# Patient Record
Sex: Female | Born: 1945 | Race: Black or African American | Hispanic: No | Smoking: Former smoker
Health system: Southern US, Community
[De-identification: ages and names within clinical notes are randomized; demographics above are authoritative.]

## PROBLEM LIST (undated history)

## (undated) DIAGNOSIS — I639 Cerebral infarction, unspecified: Secondary | ICD-10-CM

## (undated) DIAGNOSIS — C801 Malignant (primary) neoplasm, unspecified: Secondary | ICD-10-CM

## (undated) DIAGNOSIS — I10 Essential (primary) hypertension: Secondary | ICD-10-CM

## (undated) DIAGNOSIS — C50919 Malignant neoplasm of unspecified site of unspecified female breast: Secondary | ICD-10-CM

## (undated) DIAGNOSIS — F0390 Unspecified dementia without behavioral disturbance: Secondary | ICD-10-CM

---

## 2018-02-17 ENCOUNTER — Other Ambulatory Visit: Payer: Self-pay | Admitting: Nurse Practitioner

## 2018-02-17 DIAGNOSIS — Z1231 Encounter for screening mammogram for malignant neoplasm of breast: Secondary | ICD-10-CM

## 2019-04-14 ENCOUNTER — Ambulatory Visit: Payer: Medicare PPO | Admitting: Physical Therapy

## 2019-05-14 ENCOUNTER — Other Ambulatory Visit: Payer: Self-pay

## 2019-05-14 ENCOUNTER — Ambulatory Visit: Payer: Medicare PPO | Attending: Family Medicine | Admitting: Physical Therapy

## 2019-05-14 ENCOUNTER — Encounter: Payer: Self-pay | Admitting: Physical Therapy

## 2019-05-14 DIAGNOSIS — R296 Repeated falls: Secondary | ICD-10-CM | POA: Diagnosis present

## 2019-05-14 DIAGNOSIS — R262 Difficulty in walking, not elsewhere classified: Secondary | ICD-10-CM | POA: Diagnosis present

## 2019-05-14 DIAGNOSIS — R29810 Facial weakness: Secondary | ICD-10-CM

## 2019-05-14 NOTE — Therapy (Signed)
[place], [place], [address] Phone: [phone]   Fax:  [phone]  Physical Therapy Evaluation  Patient Details  Name: Olivia Ross MRN: [id number] Date of Birth: [birth year] Referring Provider (PT): Manfredl   Encounter Date: 05/14/2019  PT End of Session - 05/14/19 1144    Visit Number  1    Date for PT Re-Evaluation  07/12/19    PT Start Time  [id number]    PT Stop Time  1105    PT Time Calculation (min)  30 min    Activity Tolerance  Patient tolerated treatment well    Behavior During Therapy  [facility] for tasks assessed/performed       History reviewed. No pertinent past medical history.  History reviewed. No pertinent surgical history.  There were no vitals filed for this visit.   Subjective Assessment - 05/14/19 1038    Subjective  Patient arrives 20 minutes late, but our evals are out aobut 2 weeks so I agreed to see her.  She reports that she had a hospitalization back in November, she reports that she was weak and had diffiuclty walking.  She denies any falls, lives with daughter.  She does describe that she was having home health a few years ago due to weakness., patients order is from early December but she is just now getting here.  Patient is a poor historian, when the daughter came in she helped fill in some of the history, has had home health a few years ago for weakness, had a fall in the summer with a broken jaw.    Patient Stated Goals  be stronger, walk better    Currently in Pain?  No/denies         [facility] PT Assessment - 05/14/19 0001      Assessment   Medical Diagnosis  weakness, difficulty walking    Referring Provider (PT)  Manfredl    Onset Date/Surgical Date  02/11/19      Precautions   Precautions  None      Balance Screen   Has the patient fallen in the past 6 months  No    Has the patient had a decrease in activity level because of a fear of falling?   Yes     Is the patient reluctant to leave their home because of a fear of falling?   Yes      Home Environment   Additional Comments  lives with daughter in an apartment,       Prior Function   Level of Independence  Independent with household mobility with device;Needs assistance with homemaking    Vocation  Retired    Leisure  no      Mining engineer Comments  fwd head, rounded shoulders      ROM / Strength   AROM / PROM / Strength  AROM;Strength      AROM   Overall AROM Comments  WFL's      Strength   Overall Strength Comments  Shoulders 3+/5, hips 4-/5, knee extension 4/5, knee flexion 3+/5, ankdl 4-/5      Ambulation/Gait   Gait Comments  uses a walker, at times is distracted, shuffles feet      Standardized Balance Assessment   Standardized Balance Assessment  Berg Balance Test;Timed Up and Go Test      Berg Balance Test   Sit to Stand  Able to stand using hands after several  tries    Standing Unsupported  Needs several tries to stand 30 seconds unsupported    Sitting with Back Unsupported but Feet Supported on Floor or Stool  Able to sit safely and securely 2 minutes    Stand to Sit  Controls descent by using hands    Transfers  Able to transfer safely, definite need of hands    Standing Unsupported with Eyes Closed  Able to stand 10 seconds with supervision    Standing Unsupported with Feet Together  Needs help to attain position but able to stand for 30 seconds with feet together    From Standing, Reach Forward with Outstretched Arm  Can reach forward >5 cm safely (2")    From Standing Position, Pick up Object from Floor  Unable to try/needs assist to keep balance    From Standing Position, Turn to Look Behind Over each Shoulder  Needs supervision when turning    Turn 360 Degrees  Needs assistance while turning    Standing Unsupported, Alternately Place Feet on Step/Stool  Needs assistance to keep from falling or unable to try    Standing Unsupported, One  Foot in [employer] balance while stepping or standing    Standing on One Leg  Unable to try or needs assist to prevent fall    Total Score  20      Timed Up and Go Test   Normal TUG (seconds)  90    TUG Comments  with FWW, very slow, step to gait                Objective measurements completed on examination: See above findings.              PT Education - 05/14/19 1143    Education Details  gave HEP for her holding walker, doing kicks and marches with the daughter    Person(s) Educated  Patient;Child(ren)    Methods  Explanation;Demonstration;Handout    Comprehension  Verbalized understanding       PT Short Term Goals - 05/14/19 1153      PT SHORT TERM GOAL #1   Title  independent with initial HEP    Time  2    Period  Weeks    Status  New        PT Long Term Goals - 05/14/19 1153      PT LONG TERM GOAL #1   Title  decrease TUG time to 45 seconds    Time  8    Period  Weeks    Status  New      PT LONG TERM GOAL #2   Title  increase BERG balance score to 40/56    Time  8    Period  Weeks    Status  New      PT LONG TERM GOAL #3   Title  stand up from sitting without hands    Time  8    Period  Weeks    Status  New      PT LONG TERM GOAL #4   Title  be independent with an advanced HEP for balance and strength    Time  8    Period  Weeks    Status  New             Plan - 05/14/19 1145    Clinical Impression Statement  Patient is a poor historian, she seems distracted and times and had to process at times.  The daughter helped fill in some but due to them being late it was a rushed eval.  Her TUG was 90 seconds and her BERG was 20/56 putting her at a high risk for falls.  She was very slow with shuffling gait and was unable to let go and attempt much balance wihtout holding onto something    Stability/Clinical Decision Making  Evolving/Moderate complexity    Clinical Decision Making  Moderate    Rehab Potential  Good    PT  Frequency  2x / week    PT Duration  8 weeks    PT Treatment/Interventions  ADLs/Self Care Home Management;Functional mobility training;Stair training;Gait training;Therapeutic activities;Therapeutic exercise;Balance training;Neuromuscular re-education;Patient/family education    PT Next Visit Plan  start gym activities    Consulted and Agree with Plan of Care  Patient       Patient will benefit from skilled therapeutic intervention in order to improve the following deficits and impairments:  Improper body mechanics, Postural dysfunction, Decreased strength, Decreased range of motion, Decreased activity tolerance, Decreased endurance, Decreased balance, Decreased coordination, Cardiopulmonary status limiting activity, Difficulty walking  Visit Diagnosis: Facial weakness - Plan: PT plan of care cert/re-cert  Difficulty in walking, not elsewhere classified - Plan: PT plan of care cert/re-cert  Repeated falls - Plan: PT plan of care cert/re-cert     Problem List There are no problems to display for this patient.   Sumner Boast., PT 05/14/2019, 11:59 AM  [address] [place], [place], [address] Phone: [phone]   Fax:  [phone]  Name: Olivia Ross MRN: [id number] Date of Birth: 1945-09-20

## 2019-05-21 ENCOUNTER — Ambulatory Visit: Payer: Medicare PPO | Admitting: Physical Therapy

## 2019-05-25 ENCOUNTER — Other Ambulatory Visit: Payer: Self-pay

## 2019-05-25 ENCOUNTER — Encounter: Payer: Self-pay | Admitting: Physical Therapy

## 2019-05-25 ENCOUNTER — Ambulatory Visit: Payer: Medicare PPO | Admitting: Physical Therapy

## 2019-05-25 DIAGNOSIS — R29810 Facial weakness: Secondary | ICD-10-CM | POA: Diagnosis not present

## 2019-05-25 DIAGNOSIS — R262 Difficulty in walking, not elsewhere classified: Secondary | ICD-10-CM

## 2019-05-25 DIAGNOSIS — R296 Repeated falls: Secondary | ICD-10-CM

## 2019-05-25 NOTE — Therapy (Addendum)
[place] [address] [place], [place], [address] Phone: [phone]   Fax:  [phone]  Physical Therapy Treatment  Patient Details  Name: Olivia Ross MRN: [id number] Date of Birth: 1945-08-27 Referring Provider (PT): Manfredl   Encounter Date: 05/25/2019  PT End of Session - 05/25/19 1340    Visit Number  2    Date for PT Re-Evaluation  07/12/19    PT Start Time  1324    PT Stop Time  1400    PT Time Calculation (min)  36 min       No past medical history on file.  No past surgical history on file.  There were no vitals filed for this visit.  Subjective Assessment - 05/25/19 1331    Subjective  pt did not make last session, daughter called and stated forgot appt. pt 9 min late today. c/o RT knee buckling and trembling    Currently in Pain?  No/denies                       [facility] Adult PT Treatment/Exercise - 05/25/19 0001      Exercises   Exercises  Knee/Hip      Knee/Hip Exercises: Aerobic   Nustep  L 3 6 min      Knee/Hip Exercises: Standing   Other Standing Knee Exercises  HHA marching fwd and backward 10 feet 2 times each   min A with cuing   Other Standing Knee Exercises  HHA side stepping 10 feet 2 times each   min A with cuing     Knee/Hip Exercises: Seated   Long Arc Quad  Both;2 sets;10 reps   3#   Long Arc Quad Weight  3 lbs.    Ball Squeeze  15x    Marching  Strengthening;Both;2 sets;10 reps;Weights    Marching Weights  3 lbs.    Hamstring Curl  Strengthening;Both;2 sets;10 reps   red tband   Abduction/Adduction   Both;2 sets;10 reps;Weights    Abd/Adduction Weights  3 lbs.    Sit to Sand  2 sets;5 reps;with UE support   from mat with cuing               PT Short Term Goals - 05/14/19 1153      PT SHORT TERM GOAL #1   Title  independent with initial HEP    Time  2    Period  Weeks    Status  New        PT Long Term Goals - 05/14/19 1153      PT LONG TERM GOAL #1   Title  decrease TUG time to 45 seconds    Time  8    Period  Weeks    Status  New      PT LONG TERM GOAL #2   Title  increase BERG balance score to 40/56    Time  8    Period  Weeks    Status  New      PT LONG TERM GOAL #3   Title  stand up from sitting without hands    Time  8    Period  Weeks    Status  New      PT LONG TERM GOAL #4   Title  be independent with an advanced HEP for balance and strength    Time  8    Period  Weeks  Status  New            Plan - 05/25/19 1342    Clinical Impression Statement  pt 9 minlate. pt and daughter verb RT knee buckled but pt denied pain throughout session, daughter did not stay for session. pt tolerated initial progression of ther ex fair - she needed constant cueing to continue with ex an dfor correct speed and ROM , pt was easily distratced and had a hard time staying on task.    PT Treatment/Interventions  ADLs/Self Care Home Management;Functional mobility training;Stair training;Gait training;Therapeutic activities;Therapeutic exercise;Balance training;Neuromuscular re-education;Patient/family education    PT Next Visit Plan  assess response to ex this session and progress       Patient will benefit from skilled therapeutic intervention in order to improve the following deficits and impairments:  Improper body mechanics, Postural dysfunction, Decreased strength, Decreased range of motion, Decreased activity tolerance, Decreased endurance, Decreased balance, Decreased coordination, Cardiopulmonary status limiting activity, Difficulty walking  Visit Diagnosis: Difficulty in walking, not elsewhere classified  Repeated falls     Problem List There are no problems to display for this patient.   [name],ANGIE PTA 05/25/2019, 1:49 PM  [place], [place], [address] Phone: [phone]   Fax:  [phone]  Name:  Olivia Ross MRN: [id number] Date of Birth: February 26, 1946  Patient's daughter Colletta [place] called and left a message for me to call her back, I returned her call at [facility].  She told me tha tshe felt belittled for the therapist to tell her that she was late and to try to get here on time.  The PTA explained that she just wanted the patient to get the amount of therapy needed.  I spoke to [place] and told her that I was sorry that she took the information that way but that we adhere to a schedule and try to stay on time to allow the amount of therapy that each patient is needing.  She told me that she was all alone and she is caring for her mom and doing this on her lunch hour and it is difficult, I tried to start to explain to her that we just try to let patients know so they can be on time, she got very angry and cussed numerous times at me that I did not understand what she is going through, ans she is doing the best that she can, I talked a little about that we changed appointments for her to not have to try to do it all at lunch, she started cussing again and I told her that I understand her frustration, she cussed and said that I did not and hung up Lum Babe, PT

## 2019-06-01 ENCOUNTER — Ambulatory Visit: Payer: Medicare PPO | Attending: Family Medicine

## 2019-06-01 ENCOUNTER — Other Ambulatory Visit: Payer: Self-pay

## 2019-06-01 DIAGNOSIS — R29810 Facial weakness: Secondary | ICD-10-CM | POA: Diagnosis present

## 2019-06-01 DIAGNOSIS — R262 Difficulty in walking, not elsewhere classified: Secondary | ICD-10-CM

## 2019-06-01 DIAGNOSIS — R296 Repeated falls: Secondary | ICD-10-CM | POA: Diagnosis present

## 2019-06-01 NOTE — Therapy (Signed)
[place] [place], [place], [address] Phone: [phone]   Fax:  [phone]  Physical Therapy Treatment  Patient Details  Name: Olivia Ross MRN: [id number] Date of Birth: [birth year] Referring Provider (PT): Manfredl   Encounter Date: 06/01/2019  PT End of Session - 06/01/19 1707    Visit Number  3    Date for PT Re-Evaluation  07/12/19    PT Start Time  1701    PT Stop Time  1741    PT Time Calculation (min)  40 min       No past medical history on file.  No past surgical history on file.  There were no vitals filed for this visit.  Subjective Assessment - 06/01/19 1705    Subjective  Pt. doing well. Pt. seen with daughter in waiting room.    Patient Stated Goals  be stronger, walk better    Currently in Pain?  No/denies    Multiple Pain Sites  No                       [facility] Adult PT Treatment/Exercise - 06/01/19 0001      Neuro Re-ed    Neuro Re-ed Details   with HH assist from therapist;  side stepping at mat table 8 x 8 ft       Lumbar Exercises: Standing   Heel Raises  10 reps;3 seconds      Knee/Hip Exercises: Aerobic   Nustep  L 3,  6 min      Knee/Hip Exercises: Standing   Heel Raises  Both;10 reps;2 sets    Heel Raises Limitations  heel and toe raise in RW    Knee Flexion  Right;Left;10 reps    Knee Flexion Limitations  in RW    Hip Flexion  Right;Left;15 reps;Knee bent;Stengthening    Hip Flexion Limitations  HH assist from therapist     Hip Abduction  Right;Left;Knee straight;Stengthening;5 reps    Abduction Limitations  HH assist       Knee/Hip Exercises: Seated   Long Arc Quad  Right;Left;15 reps    Long Arc Quad Weight  3 lbs.               PT Short Term Goals - 06/01/19 1708      PT SHORT TERM GOAL #1   Title  independent with initial HEP    Time  2    Period  Weeks    Status  On-going        PT Long Term Goals - 06/01/19 1708       PT LONG TERM GOAL #1   Title  decrease TUG time to 45 seconds    Time  8    Period  Weeks    Status  On-going      PT LONG TERM GOAL #2   Title  increase BERG balance score to 40/56    Time  8    Period  Weeks    Status  On-going      PT LONG TERM GOAL #3   Title  stand up from sitting without hands    Time  8    Period  Weeks    Status  On-going      PT LONG TERM GOAL #4   Title  be independent with an advanced HEP for balance and strength    Time  8    Period  Weeks    Status  On-going            Plan - 06/01/19 1714    Clinical Impression Statement  Pt. doing well with no complaints.  Daughter in waiting room in session.  Tolerated all standing and sitting LE strengthening activities well today.  Pt. tolerated standing balance/HH assisted balance well today however required constant cueing throughout for pacing, effort, proper technique.  Pt. easily distractable in session however remained aggregable and willing to try all activities.  Ended session with pt. pain free however noting LE fatigue.  Pt. accompanied to daughters car to end session.    Rehab Potential  Good    PT Treatment/Interventions  ADLs/Self Care Home Management;Functional mobility training;Stair training;Gait training;Therapeutic activities;Therapeutic exercise;Balance training;Neuromuscular re-education;Patient/family education    PT Next Visit Plan  Progress LE strengthening/balance per pt.    Consulted and Agree with Plan of Care  Patient       Patient will benefit from skilled therapeutic intervention in order to improve the following deficits and impairments:  Improper body mechanics, Postural dysfunction, Decreased strength, Decreased range of motion, Decreased activity tolerance, Decreased endurance, Decreased balance, Decreased coordination, Cardiopulmonary status limiting activity, Difficulty walking  Visit Diagnosis: Difficulty in walking, not elsewhere classified  Repeated  falls  Facial weakness     Problem List There are no problems to display for this patient.   Bess Harvest, PTA 06/01/19 5:57 PM   [place] [address] [place], [place], [address] Phone: [phone]   Fax:  [phone]  Name: Olivia Ross MRN: [id number] Date of Birth: 1946/02/08

## 2019-06-08 ENCOUNTER — Encounter: Payer: Self-pay | Admitting: Physical Therapy

## 2019-06-08 ENCOUNTER — Ambulatory Visit: Payer: Medicare PPO | Admitting: Physical Therapy

## 2019-06-08 ENCOUNTER — Other Ambulatory Visit: Payer: Self-pay

## 2019-06-08 DIAGNOSIS — R262 Difficulty in walking, not elsewhere classified: Secondary | ICD-10-CM | POA: Diagnosis not present

## 2019-06-08 DIAGNOSIS — R296 Repeated falls: Secondary | ICD-10-CM

## 2019-06-08 NOTE — Therapy (Signed)
[place] [place], [place], [address] Phone: [phone]   Fax:  [phone]  Physical Therapy Treatment  Patient Details  Name: Olivia Ross MRN: [id number] Date of Birth: [birth year] Referring Provider (PT): Manfredl   Encounter Date: 06/08/2019  PT End of Session - 06/08/19 1746    Visit Number  4    Date for PT Re-Evaluation  07/12/19    PT Start Time  9480    PT Stop Time  1745    PT Time Calculation (min)  50 min    Activity Tolerance  Patient tolerated treatment well    Behavior During Therapy  [facility] for tasks assessed/performed       History reviewed. No pertinent past medical history.  History reviewed. No pertinent surgical history.  There were no vitals filed for this visit.  Subjective Assessment - 06/08/19 1703    Subjective  Patient reports that she is doing exercises at home.  She denies any falls    Currently in Pain?  No/denies                       [facility] Adult PT Treatment/Exercise - 06/08/19 0001      High Level Balance   High Level Balance Activities  Side stepping;Backward walking;Direction changes;Negotiating over obstacles    High Level Balance Comments  seated and standing ball toss      Lumbar Exercises: Aerobic   UBE (Upper Arm Bike)  level 2 x 4 minutes      Knee/Hip Exercises: Aerobic   Nustep  L 4,  6 min      Knee/Hip Exercises: Machines for Strengthening   Cybex Knee Extension  5#2x10    Cybex Knee Flexion  15# 2x10               PT Short Term Goals - 06/01/19 1708      PT SHORT TERM GOAL #1   Title  independent with initial HEP    Time  2    Period  Weeks    Status  On-going        PT Long Term Goals - 06/08/19 1749      PT LONG TERM GOAL #1   Title  decrease TUG time to 45 seconds    Status  On-going      PT LONG TERM GOAL #2   Title  increase BERG balance score to 40/56    Status  On-going      PT LONG TERM GOAL  #3   Title  stand up from sitting without hands    Status  On-going      PT LONG TERM GOAL #4   Title  be independent with an advanced HEP for balance and strength    Status  Partially Met            Plan - 06/08/19 1747    Clinical Impression Statement  Patient did well with all activities, I added more balance and some weights, she did report fear of the direction changes due to dizziness but we went slow and had no issues.  With the leg machine exercises she did require some cues to stay on task and do full ROM.  we tried some HHA gait over obstacle and she was at times unsteady especially with turns    PT Next Visit Plan  Progress LE strengthening/balance    Consulted and Agree with Plan of  Care  Patient       Patient will benefit from skilled therapeutic intervention in order to improve the following deficits and impairments:  Improper body mechanics, Postural dysfunction, Decreased strength, Decreased range of motion, Decreased activity tolerance, Decreased endurance, Decreased balance, Decreased coordination, Cardiopulmonary status limiting activity, Difficulty walking  Visit Diagnosis: Difficulty in walking, not elsewhere classified  Repeated falls     Problem List There are no problems to display for this patient.   Sumner Boast., PT 06/08/2019, 5:50 PM  [place] [address] [place], [place], [address] Phone: [phone]   Fax:  [phone]  Name: Baleigh Rennaker MRN: [id number] Date of Birth: 19-Aug-1945

## 2019-07-07 DIAGNOSIS — F0390 Unspecified dementia without behavioral disturbance: Secondary | ICD-10-CM | POA: Diagnosis present

## 2019-07-21 ENCOUNTER — Encounter: Payer: Self-pay | Admitting: Physical Therapy

## 2019-07-21 ENCOUNTER — Other Ambulatory Visit: Payer: Self-pay

## 2019-07-21 ENCOUNTER — Ambulatory Visit: Payer: Medicare PPO | Attending: Family Medicine | Admitting: Physical Therapy

## 2019-07-21 DIAGNOSIS — R262 Difficulty in walking, not elsewhere classified: Secondary | ICD-10-CM | POA: Diagnosis present

## 2019-07-21 DIAGNOSIS — R296 Repeated falls: Secondary | ICD-10-CM | POA: Insufficient documentation

## 2019-07-21 NOTE — Therapy (Signed)
[address] [place], [place], [address] Phone: [phone]   Fax:  [phone]  Physical Therapy Treatment  Patient Details  Name: Olivia Ross MRN: [id number] Date of Birth: May 11, 1945 Referring Provider (PT): Manfredl   Encounter Date: 07/21/2019  PT End of Session - 07/21/19 1750    Visit Number  5    Date for PT Re-Evaluation  08/20/19    PT Start Time  8280    PT Stop Time  1745    PT Time Calculation (min)  50 min    Activity Tolerance  Patient tolerated treatment well    Behavior During Therapy  [facility] for tasks assessed/performed       History reviewed. No pertinent past medical history.  History reviewed. No pertinent surgical history.  There were no vitals filed for this visit.  Subjective Assessment - 07/21/19 1704    Subjective  Denies falls, she has not been in in about 6 weeks.  She reports that she is just sleepy all the time.  There has been difficulty getting her here due to her daughter works full time.    Currently in Pain?  No/denies         [facility] PT Assessment - 07/21/19 0001      Assessment   Medical Diagnosis  weakness, difficulty walking    Referring Provider (PT)  Manfredl      Ambulation/Gait   Gait Comments  stairs with one hand rail and a hand hold assist one at a time down 10 and up 10      Timed Up and Go Test   Normal TUG (seconds)  45                   [facility] Adult PT Treatment/Exercise - 07/21/19 0001      High Level Balance   High Level Balance Activities  Side stepping;Backward walking;Direction changes;Negotiating over obstacles    High Level Balance Comments  seated and standing ball toss      Lumbar Exercises: Aerobic   UBE (Upper Arm Bike)  level 4 x 6 minutes      Knee/Hip Exercises: Machines for Strengthening   Cybex Knee Extension  5#3x10    Cybex Knee Flexion  15# 3x10               PT Short Term Goals - 07/21/19 1753       PT SHORT TERM GOAL #1   Title  independent with initial HEP    Status  Achieved        PT Long Term Goals - 07/21/19 1753      PT LONG TERM GOAL #1   Title  decrease TUG time to 45 seconds    Status  Achieved      PT LONG TERM GOAL #2   Title  increase BERG balance score to 40/56    Status  On-going      PT LONG TERM GOAL #3   Title  stand up from sitting without hands    Status  Partially Met            Plan - 07/21/19 1751    Clinical Impression Statement  Patient has not been in to see [place] in 6 weeks, there are transportatiaon problems with her daughter working full time, it is hard to get off work and get to her mom and then get her here.  Ms. Goetzinger is easily distracted, she  did cut her TUG time in half, the caps on the back legs of her walker are worn through so she is picking up the [place] to advance it then doing a step to pattern, I tried to fix this with having her just push the walker and do a step through pattern, suggested getting walker skis    PT Next Visit Plan  gait, stregnth and balance    Consulted and Agree with Plan of Care  Patient       Patient will benefit from skilled therapeutic intervention in order to improve the following deficits and impairments:  Improper body mechanics, Postural dysfunction, Decreased strength, Decreased range of motion, Decreased activity tolerance, Decreased endurance, Decreased balance, Decreased coordination, Cardiopulmonary status limiting activity, Difficulty walking  Visit Diagnosis: Difficulty in walking, not elsewhere classified - Plan: PT plan of care cert/re-cert  Repeated falls - Plan: PT plan of care cert/re-cert     Problem List There are no problems to display for this patient.   Sumner Boast., PT 07/21/2019, 5:55 PM  [address] [place], [place], [address] Phone: [phone]   Fax:  [phone]  Name: Olivia Ross MRN: [id number] Date of Birth: [birth year]

## 2019-08-05 DIAGNOSIS — C50411 Malignant neoplasm of upper-outer quadrant of right female breast: Secondary | ICD-10-CM | POA: Insufficient documentation

## 2019-08-12 ENCOUNTER — Other Ambulatory Visit: Payer: Self-pay

## 2019-08-12 ENCOUNTER — Ambulatory Visit: Payer: Medicare PPO | Attending: Family Medicine | Admitting: Physical Therapy

## 2019-08-12 ENCOUNTER — Encounter: Payer: Self-pay | Admitting: Physical Therapy

## 2019-08-12 DIAGNOSIS — R296 Repeated falls: Secondary | ICD-10-CM | POA: Diagnosis present

## 2019-08-12 DIAGNOSIS — R262 Difficulty in walking, not elsewhere classified: Secondary | ICD-10-CM | POA: Insufficient documentation

## 2019-08-12 NOTE — Therapy (Signed)
[place] [address] [place], [place], [address] Phone: [phone]   Fax:  [phone]  Physical Therapy Treatment  Patient Details  Name: Olivia Ross MRN: [id number] Date of Birth: [birth year] Referring Provider (PT): Manfredl   Encounter Date: 08/12/2019  PT End of Session - 08/12/19 1751    Visit Number  6    Date for PT Re-Evaluation  08/20/19    PT Start Time  1702    PT Stop Time  1752    PT Time Calculation (min)  50 min    Activity Tolerance  Patient tolerated treatment well    Behavior During Therapy  [facility] for tasks assessed/performed       History reviewed. No pertinent past medical history.  History reviewed. No pertinent surgical history.  There were no vitals filed for this visit.  Subjective Assessment - 08/12/19 1707    Subjective  Patient reports that she found out she has breast CA, she reports being a little upset, she reports tha tshe has started a new medicine that is making her not feel very good, she does report some right bicep area pain    Currently in Pain?  Yes    Pain Score  3     Pain Location  Arm    Pain Orientation  Right;Upper    Pain Descriptors / Indicators  Aching                        [facility] Adult PT Treatment/Exercise - 08/12/19 0001      Ambulation/Gait   Gait Comments  I put tennis balls on her walker, this was much better with her gait      High Level Balance   High Level Balance Activities  Side stepping;Backward walking;Direction changes;Negotiating over obstacles;Marching forwards    High Level Balance Comments  standing ball toss, 6" toe touches with holding a SPC      Lumbar Exercises: Aerobic   UBE (Upper Arm Bike)  level 4 x 6 minutes      Knee/Hip Exercises: Machines for Strengthening   Cybex Knee Extension  5#3x10    Cybex Knee Flexion  15# 3x10    Cybex Leg Press  no weight 2 x10 this was very difficult for her                PT Short Term Goals - 07/21/19 1753      PT SHORT TERM GOAL #1   Title  independent with initial HEP    Status  Achieved        PT Long Term Goals - 07/21/19 1753      PT LONG TERM GOAL #1   Title  decrease TUG time to 45 seconds    Status  Achieved      PT LONG TERM GOAL #2   Title  increase BERG balance score to 40/56    Status  On-going      PT LONG TERM GOAL #3   Title  stand up from sitting without hands    Status  Partially Met            Plan - 08/12/19 1752    Clinical Impression Statement  Paitent reports a breast CA dx, has a new medication and the daughter tells me that it can cause joint pain, patient does report right arm and shoulder pain.  She reported that the leg press was the hardest thing  I have had her do, the tennis balls helped due to her not having to pick up the walker every step.    PT Next Visit Plan  gait, stregnth and balance    Consulted and Agree with Plan of Care  Patient       Patient will benefit from skilled therapeutic intervention in order to improve the following deficits and impairments:  Improper body mechanics, Postural dysfunction, Decreased strength, Decreased range of motion, Decreased activity tolerance, Decreased endurance, Decreased balance, Decreased coordination, Cardiopulmonary status limiting activity, Difficulty walking  Visit Diagnosis: Difficulty in walking, not elsewhere classified  Repeated falls     Problem List There are no problems to display for this patient.   Sumner Boast., PT 08/12/2019, 5:54 PM  [address] [address] [place], [place], [address] Phone: [phone]   Fax:  [phone]  Name: Olivia Ross MRN: [id number] Date of Birth: 10/12/1945

## 2019-08-19 ENCOUNTER — Ambulatory Visit: Payer: Medicare PPO | Admitting: Physical Therapy

## 2019-08-19 ENCOUNTER — Encounter: Payer: Self-pay | Admitting: Physical Therapy

## 2019-08-19 ENCOUNTER — Other Ambulatory Visit: Payer: Self-pay

## 2019-08-19 DIAGNOSIS — R296 Repeated falls: Secondary | ICD-10-CM

## 2019-08-19 DIAGNOSIS — R262 Difficulty in walking, not elsewhere classified: Secondary | ICD-10-CM

## 2019-08-19 NOTE — Therapy (Signed)
[place], [place], [address] Phone: [phone]   Fax:  [phone]  Physical Therapy Treatment  Patient Details  Name: Gustie Bobb MRN: [id number] Date of Birth: 10-13-1945 Referring Provider (PT): Manfredl   Encounter Date: 08/19/2019  PT End of Session - 08/19/19 1752    Visit Number  7    Date for PT Re-Evaluation  10/20/19    Authorization Type  [employer]    PT Start Time  1713    PT Stop Time  1753    PT Time Calculation (min)  40 min    Activity Tolerance  Patient tolerated treatment well    Behavior During Therapy  [facility] for tasks assessed/performed       History reviewed. No pertinent past medical history.  History reviewed. No pertinent surgical history.  There were no vitals filed for this visit.  Subjective Assessment - 08/19/19 1716    Subjective  Patient 13 minutes late.  Reports that she has been doing good    Currently in Pain?  No/denies                        [facility] Adult PT Treatment/Exercise - 08/19/19 0001      Ambulation/Gait   Gait Comments  stairs HHA with hand rail one at a time up and down      High Level Balance   High Level Balance Activities  Side stepping;Backward walking;Direction changes;Negotiating over obstacles;Marching forwards    High Level Balance Comments  standing ball toss, 6" toe touches with holding a SPC      Lumbar Exercises: Aerobic   UBE (Upper Arm Bike)  level 5 x 7 minutes      Knee/Hip Exercises: Machines for Strengthening   Cybex Knee Extension  5#3x10    Cybex Knee Flexion  15# 3x10    Cybex Leg Press  no weight 2 x10 able to do 20# with small ROM                PT Short Term Goals - 07/21/19 1753      PT SHORT TERM GOAL #1   Title  independent with initial HEP    Status  Achieved        PT Long Term Goals - 07/21/19 1753      PT LONG TERM GOAL #1   Title  decrease TUG time to 45 seconds    Status  Achieved      PT LONG TERM GOAL #2   Title  increase BERG balance score to 40/56    Status  On-going      PT LONG TERM GOAL #3   Title  stand up from sitting without hands    Status  Partially Met            Plan - 08/19/19 1752    Clinical Impression Statement  Patient and daughter report she will be having surgery on 08/31/19.  The [employer] did not give much authorization even though they were not able to make the initial visits.  I will need to ask for more visits next visit.    PT Next Visit Plan  gait, stregnth and balance    Consulted and Agree with Plan of Care  Patient       Patient will benefit from skilled therapeutic intervention in order to improve the following deficits and impairments:  Improper body mechanics, Postural dysfunction, Decreased  strength, Decreased range of motion, Decreased activity tolerance, Decreased endurance, Decreased balance, Decreased coordination, Cardiopulmonary status limiting activity, Difficulty walking  Visit Diagnosis: Difficulty in walking, not elsewhere classified  Repeated falls     Problem List There are no problems to display for this patient.   Sumner Boast., PT 08/19/2019, 5:55 PM  [place] [address] [place], [place], [address] Phone: [phone]   Fax:  [phone]  Name: Betsey Sossamon MRN: [id number] Date of Birth: [birth year]

## 2019-08-26 ENCOUNTER — Other Ambulatory Visit: Payer: Self-pay

## 2019-08-26 ENCOUNTER — Encounter: Payer: Self-pay | Admitting: Physical Therapy

## 2019-08-26 ENCOUNTER — Ambulatory Visit: Payer: Medicare PPO | Admitting: Physical Therapy

## 2019-08-26 DIAGNOSIS — R262 Difficulty in walking, not elsewhere classified: Secondary | ICD-10-CM | POA: Diagnosis not present

## 2019-08-26 DIAGNOSIS — R296 Repeated falls: Secondary | ICD-10-CM

## 2019-08-26 NOTE — Therapy (Signed)
[place], [place], [address] Phone: [phone]   Fax:  [phone]  Physical Therapy Treatment  Patient Details  Name: Olivia Ross MRN: [id number] Date of Birth: Aug 04, 1945 Referring Provider (PT): Manfredl   Encounter Date: 08/26/2019  PT End of Session - 08/26/19 1750    Visit Number  8    Date for PT Re-Evaluation  10/20/19    Authorization Type  [employer]    PT Start Time  1655    PT Stop Time  1941    PT Time Calculation (min)  48 min    Activity Tolerance  Patient tolerated treatment well    Behavior During Therapy  [facility] for tasks assessed/performed       History reviewed. No pertinent past medical history.  History reviewed. No pertinent surgical history.  There were no vitals filed for this visit.  Subjective Assessment - 08/26/19 1659    Subjective  No issues reported by her daughter    Currently in Pain?  No/denies                        [facility] Adult PT Treatment/Exercise - 08/26/19 0001      High Level Balance   High Level Balance Comments  standing ball toss, 6" toe touches with holding a SPC, standing on firm airex trying to balance without holding on, this was very frightening for her      Lumbar Exercises: Aerobic   UBE (Upper Arm Bike)  level 5 x 7 minutes      Knee/Hip Exercises: Machines for Strengthening   Cybex Knee Extension  5#3x10    Cybex Knee Flexion  20# 3x10    Cybex Leg Press  no weight 2 x10 able to do 20# with small ROM       Knee/Hip Exercises: Standing   Heel Raises  Both;10 reps;2 sets    Heel Raises Limitations  heel and toe raise in RW    Hip Abduction  Both;2 sets;10 reps    Abduction Limitations  2.5#    Hip Extension  Both;2 sets;10 reps    Extension Limitations  2.5#               PT Short Term Goals - 07/21/19 1753      PT SHORT TERM GOAL #1   Title  independent with initial HEP    Status  Achieved         PT Long Term Goals - 08/26/19 1753      PT LONG TERM GOAL #1   Title  decrease TUG time to 45 seconds    Status  Achieved      PT LONG TERM GOAL #2   Title  increase BERG balance score to 40/56    Status  Partially Met      PT LONG TERM GOAL #3   Title  stand up from sitting without hands    Status  Partially Met      PT LONG TERM GOAL #4   Title  be independent with an advanced HEP for balance and strength    Status  Partially Met            Plan - 08/26/19 1750    Clinical Impression Statement  Patient was seen 8 of 16 times, this was due to limitations at first for her having her daughter who works get here, I saw her 8 of  12 authorized times , and then 4 of 4 of the second authorized times.  She does well with strength, she is very fearful and does not like to work on balance much due to her fear of falling.  She has improved with her walking, strength and function.  She will be having a surgery next week and will need some time to recover.  I worry that she may regress during her surgery and recovery time.  I will need to ask for more visits in July when her insurance allows.    PT Next Visit Plan  will hold due to her having surgery and her insurance having limitations on her care, the current authroizations expires 10/20/19    Consulted and Agree with Plan of Care  Patient       Patient will benefit from skilled therapeutic intervention in order to improve the following deficits and impairments:  Improper body mechanics, Postural dysfunction, Decreased strength, Decreased range of motion, Decreased activity tolerance, Decreased endurance, Decreased balance, Decreased coordination, Cardiopulmonary status limiting activity, Difficulty walking  Visit Diagnosis: Difficulty in walking, not elsewhere classified  Repeated falls     Problem List There are no problems to display for this patient.   Sumner Boast., PT 08/26/2019, 5:54 PM  [place] [address] [place], [place], [address] Phone: [phone]   Fax:  [phone]  Name: Olivia Ross MRN: [id number] Date of Birth: Jun 21, 1945

## 2020-07-21 ENCOUNTER — Encounter (HOSPITAL_COMMUNITY): Payer: Self-pay

## 2020-07-21 ENCOUNTER — Emergency Department (HOSPITAL_COMMUNITY)
Admission: EM | Admit: 2020-07-21 | Discharge: 2020-07-21 | Disposition: A | Discharge status: Home / Self Care | Payer: Medicare PPO | Attending: Emergency Medicine | Admitting: Emergency Medicine

## 2020-07-21 ENCOUNTER — Emergency Department (HOSPITAL_COMMUNITY): Payer: Medicare PPO

## 2020-07-21 DIAGNOSIS — R55 Syncope and collapse: Secondary | ICD-10-CM | POA: Insufficient documentation

## 2020-07-21 DIAGNOSIS — W010XXA Fall on same level from slipping, tripping and stumbling without subsequent striking against object, initial encounter: Secondary | ICD-10-CM | POA: Diagnosis not present

## 2020-07-21 DIAGNOSIS — N390 Urinary tract infection, site not specified: Secondary | ICD-10-CM | POA: Diagnosis not present

## 2020-07-21 DIAGNOSIS — Y92000 Kitchen of unspecified non-institutional (private) residence as  the place of occurrence of the external cause: Secondary | ICD-10-CM | POA: Insufficient documentation

## 2020-07-21 DIAGNOSIS — Z859 Personal history of malignant neoplasm, unspecified: Secondary | ICD-10-CM | POA: Diagnosis not present

## 2020-07-21 DIAGNOSIS — D72829 Elevated white blood cell count, unspecified: Secondary | ICD-10-CM | POA: Insufficient documentation

## 2020-07-21 DIAGNOSIS — B9689 Other specified bacterial agents as the cause of diseases classified elsewhere: Secondary | ICD-10-CM | POA: Insufficient documentation

## 2020-07-21 DIAGNOSIS — W19XXXA Unspecified fall, initial encounter: Secondary | ICD-10-CM

## 2020-07-21 DIAGNOSIS — I10 Essential (primary) hypertension: Secondary | ICD-10-CM | POA: Insufficient documentation

## 2020-07-21 HISTORY — DX: Essential (primary) hypertension: I10

## 2020-07-21 HISTORY — DX: Malignant (primary) neoplasm, unspecified: C80.1

## 2020-07-21 LAB — CBC WITH DIFFERENTIAL/PLATELET
Abs Immature Granulocytes: 0.08 10*3/uL — ABNORMAL HIGH (ref 0.00–0.07)
Basophils Absolute: 0.1 10*3/uL (ref 0.0–0.1)
Basophils Relative: 0 %
Eosinophils Absolute: 0 10*3/uL (ref 0.0–0.5)
Eosinophils Relative: 0 %
HCT: 44.4 % (ref 36.0–46.0)
Hemoglobin: 14.4 g/dL (ref 12.0–15.0)
Immature Granulocytes: 1 %
Lymphocytes Relative: 7 %
Lymphs Abs: 1 10*3/uL (ref 0.7–4.0)
MCH: 30.1 pg (ref 26.0–34.0)
MCHC: 32.4 g/dL (ref 30.0–36.0)
MCV: 92.9 fL (ref 80.0–100.0)
Monocytes Absolute: 0.7 10*3/uL (ref 0.1–1.0)
Monocytes Relative: 4 %
Neutro Abs: 13.9 10*3/uL — ABNORMAL HIGH (ref 1.7–7.7)
Neutrophils Relative %: 88 %
Platelets: 279 10*3/uL (ref 150–400)
RBC: 4.78 MIL/uL (ref 3.87–5.11)
RDW: 11.9 % (ref 11.5–15.5)
WBC: 15.7 10*3/uL — ABNORMAL HIGH (ref 4.0–10.5)
nRBC: 0 % (ref 0.0–0.2)

## 2020-07-21 LAB — URINALYSIS, ROUTINE W REFLEX MICROSCOPIC
Bilirubin Urine: NEGATIVE
Glucose, UA: NEGATIVE mg/dL
Hgb urine dipstick: NEGATIVE
Ketones, ur: 5 mg/dL — AB
Nitrite: NEGATIVE
Protein, ur: 30 mg/dL — AB
Specific Gravity, Urine: 1.025 (ref 1.005–1.030)
pH: 5 (ref 5.0–8.0)

## 2020-07-21 LAB — BASIC METABOLIC PANEL
Anion gap: 11 (ref 5–15)
BUN: 19 mg/dL (ref 8–23)
CO2: 24 mmol/L (ref 22–32)
Calcium: 10.2 mg/dL (ref 8.9–10.3)
Chloride: 105 mmol/L (ref 98–111)
Creatinine, Ser: 0.93 mg/dL (ref 0.44–1.00)
GFR, Estimated: >60 mL/min (ref >60)
Glucose, Bld: 118 mg/dL — ABNORMAL HIGH (ref 70–99)
Potassium: 3.9 mmol/L (ref 3.5–5.1)
Sodium: 140 mmol/L (ref 135–145)

## 2020-07-21 MED ORDER — CEPHALEXIN 500 MG PO CAPS: 500.0000 mg | ORAL_CAPSULE | Freq: Four times a day (QID) | ORAL | 0 refills | Status: AC

## 2020-07-21 NOTE — Discharge Instructions (Signed)
Follow-up with your primary care doctor.  Take antibiotic as prescribed for your possible urinary tract infection.  If you have any additional falls, episodes of passing out or other new concerning symptom, return to ER for reassessment.

## 2020-07-21 NOTE — ED Notes (Addendum)
Pt ambulated to restroom x 2 assist. Gait unsteady and slow. Pt states she normally walks with a walker at home.

## 2020-07-21 NOTE — ED Notes (Signed)
Attempted to call daughter, Colletta [place], regarding pts discharge. No answer. Prior to daughter leaving, she stated she would be back to pick up the pt but did not provide specific time.

## 2020-07-21 NOTE — ED Provider Notes (Signed)
[facility] DEPT Provider Note   CSN: [id number] Arrival date & time: 07/21/20  1305     History Chief Complaint  Patient presents with  . Fall    Doloras Berto is a 75 y.o. female.  Presents to ER with concern for fall.  Patient reports that she thinks that she slipped on some water in her kitchen.  Unsure if she had LOC.  EMS reported LOC.  Patient denies any pain at present.  Not having any neck or back pain.  States she has no pain in her extremities.  Denies any other complaints today.  HPI     Past Medical History:  Diagnosis Date  . Cancer ([place])   . Hypertension     There are no problems to display for this patient.   No past surgical history on file.   OB History   No obstetric history on file.     No family history on file.     Home Medications Prior to Admission medications   Medication Sig Start Date End Date Taking? Authorizing Provider  cephALEXin (KEFLEX) 500 MG capsule Take 1 capsule (500 mg total) by mouth 4 (four) times daily for 5 days. 07/21/20 07/26/20 Yes Lucrezia Starch, MD    Allergies    Patient has no allergy information on record.  Review of Systems   Review of Systems  Constitutional: Negative for chills and fever.  HENT: Negative for ear pain and sore throat.   Eyes: Negative for pain and visual disturbance.  Respiratory: Negative for cough and shortness of breath.   Cardiovascular: Negative for chest pain and palpitations.  Gastrointestinal: Negative for abdominal pain and vomiting.  Genitourinary: Negative for dysuria and hematuria.  Musculoskeletal: Negative for arthralgias and back pain.  Skin: Negative for color change and rash.  Neurological: Positive for syncope. Negative for seizures.  All other systems reviewed and are negative.   Physical Exam Updated Vital Signs BP (!) 132/95   Pulse 64   Temp 97.9 F (36.6 C) (Oral)   Resp 15   Ht 5\' 5"  (1.651 m)   Wt 68 kg   SpO2 100%    BMI 24.96 kg/m   Physical Exam Vitals and nursing note reviewed.  Constitutional:      General: She is not in acute distress.    Appearance: She is well-developed.  HENT:     Head: Normocephalic and atraumatic.  Eyes:     Conjunctiva/sclera: Conjunctivae normal.  Cardiovascular:     Rate and Rhythm: Normal rate and regular rhythm.     Heart sounds: No murmur heard.   Pulmonary:     Effort: Pulmonary effort is normal. No respiratory distress.     Breath sounds: Normal breath sounds.  Abdominal:     Palpations: Abdomen is soft.     Tenderness: There is no abdominal tenderness.  Musculoskeletal:        General: No deformity or signs of injury.     Cervical back: Normal range of motion and neck supple. No rigidity or tenderness.     Comments: No tenderness over the C, T, L-spine  Skin:    General: Skin is warm and dry.  Neurological:     General: No focal deficit present.     Mental Status: She is alert.  Psychiatric:        Mood and Affect: Mood normal.     ED Results / Procedures / Treatments   Labs (all labs ordered are listed,  but only abnormal results are displayed) Labs Reviewed  CBC WITH DIFFERENTIAL/PLATELET - Abnormal; Notable for the following components:      Result Value   WBC 15.7 (*)    Neutro Abs 13.9 (*)    Abs Immature Granulocytes 0.08 (*)    All other components within normal limits  BASIC METABOLIC PANEL - Abnormal; Notable for the following components:   Glucose, Bld 118 (*)    All other components within normal limits  URINALYSIS, ROUTINE W REFLEX MICROSCOPIC - Abnormal; Notable for the following components:   Color, Urine AMBER (*)    APPearance HAZY (*)    Ketones, ur 5 (*)    Protein, ur 30 (*)    Leukocytes,Ua MODERATE (*)    Bacteria, UA RARE (*)    All other components within normal limits    EKG EKG Interpretation  Date/Time:  Friday July 21 2020 13:24:22 EDT Ventricular Rate:  63 PR Interval:  151 QRS Duration: 79 QT  Interval:  403 QTC Calculation: 413 R Axis:   39 Text Interpretation: Sinus rhythm Probable anteroseptal infarct, old Confirmed by Madalyn Rob [id number] on 07/21/2020 2:12:18 PM   Radiology DG Chest 2 View  Result Date: 07/21/2020 CLINICAL DATA:  Syncope. Additional history provided: Unwitnessed fall followed by loss of consciousness. EXAM: CHEST - 2 VIEW COMPARISON:  Prior chest radiograph 02/24/2019. FINDINGS: Heart size within normal limits. Aortic atherosclerosis. No appreciable airspace consolidation. No evidence of pleural effusion or pneumothorax. No acute bony abnormality identified. Thoracic spondylosis. Surgical clips project in the region of the right axilla. IMPRESSION: No evidence of acute cardiopulmonary abnormality. Aortic Atherosclerosis (ICD10-I70.0). Electronically Signed   By: Kellie Simmering DO   On: 07/21/2020 14:38   CT Head Wo Contrast  Result Date: 07/21/2020 CLINICAL DATA:  Head trauma, moderate/severe. Neck trauma. Additional history provided: Unwitnessed fall in kitchen followed by loss of consciousness. Hematoma to back of head. EXAM: CT HEAD WITHOUT CONTRAST CT CERVICAL SPINE WITHOUT CONTRAST TECHNIQUE: Multidetector CT imaging of the head and cervical spine was performed following the standard protocol without intravenous contrast. Multiplanar CT image reconstructions of the cervical spine were also generated. COMPARISON:  Head CT 02/24/2019. CT head/maxillofacial/cervical spine 09/22/2017. FINDINGS: CT HEAD FINDINGS Brain: Mild cerebral and cerebellar atrophy. Redemonstrated large chronic cortical/subcortical posterior right MCA territory infarct within the right parietal, occipital and temporal lobes. Chronic lacunar infarcts within the bilateral basal ganglia. Background mild ill-defined hypoattenuation within the cerebral white matter is nonspecific, but compatible with chronic small vessel ischemic disease. Chronic infarcts within the bilateral cerebellar hemispheres.  There is no acute intracranial hemorrhage. No acute demarcated cortical infarct. No extra-axial fluid collection. No evidence of intracranial mass. No midline shift. Vascular: No hyperdense vessel.  Atherosclerotic calcifications. Skull: Normal. Negative for fracture or focal lesion. Sinuses/Orbits: Visualized orbits show no acute finding. No significant paranasal sinus disease at the imaged levels. CT CERVICAL SPINE FINDINGS Alignment: Mild cervical levocurvature. Straightening of the expected cervical lordosis. No significant spondylolisthesis. Skull base and vertebrae: The basion-dental and atlanto-dental intervals are maintained.No evidence of acute fracture to the cervical spine. Congenital nonunion of the posterior arch of C1. Soft tissues and spinal canal: No prevertebral fluid or swelling. No visible canal hematoma. Disc levels: Cervical spondylosis with multilevel disc space narrowing, central disc protrusions, disc bulges and uncovertebral hypertrophy. Disc space narrowing is greatest at C5-C6 (severe), C6-C7 (severe) and C7-T1 (moderate/severe). Multilevel degenerative endplate irregularity. No appreciable high-grade spinal canal stenosis. Bony neural foraminal narrowing bilaterally at C5-C6. Upper  chest: No consolidation within the imaged lung apices. No visible pneumothorax. IMPRESSION: CT head: 1. No evidence of acute intracranial abnormality. 2. Stable non-contrast CT appearance of the brain. 3. 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 4. Chronic infarcts within the bilateral basal ganglia and cerebellar hemispheres. 5. Background mild generalized parenchymal atrophy and mild cerebral white matter chronic small vessel ischemic disease. CT cervical spine: 1. No evidence of acute fracture to the cervical spine. 2. Nonspecific straightening of the expected cervical lordosis. 3. Mild cervical levocurvature. 4. Cervical spondylosis, as described. Electronically Signed   By:  Kellie Simmering DO   On: 07/21/2020 14:36   CT Cervical Spine Wo Contrast  Result Date: 07/21/2020 CLINICAL DATA:  Head trauma, moderate/severe. Neck trauma. Additional history provided: Unwitnessed fall in kitchen followed by loss of consciousness. Hematoma to back of head. EXAM: CT HEAD WITHOUT CONTRAST CT CERVICAL SPINE WITHOUT CONTRAST TECHNIQUE: Multidetector CT imaging of the head and cervical spine was performed following the standard protocol without intravenous contrast. Multiplanar CT image reconstructions of the cervical spine were also generated. COMPARISON:  Head CT 02/24/2019. CT head/maxillofacial/cervical spine 09/22/2017. FINDINGS: CT HEAD FINDINGS Brain: Mild cerebral and cerebellar atrophy. Redemonstrated large chronic cortical/subcortical posterior right MCA territory infarct within the right parietal, occipital and temporal lobes. Chronic lacunar infarcts within the bilateral basal ganglia. Background mild ill-defined hypoattenuation within the cerebral white matter is nonspecific, but compatible with chronic small vessel ischemic disease. Chronic infarcts within the bilateral cerebellar hemispheres. There is no acute intracranial hemorrhage. No acute demarcated cortical infarct. No extra-axial fluid collection. No evidence of intracranial mass. No midline shift. Vascular: No hyperdense vessel.  Atherosclerotic calcifications. Skull: Normal. Negative for fracture or focal lesion. Sinuses/Orbits: Visualized orbits show no acute finding. No significant paranasal sinus disease at the imaged levels. CT CERVICAL SPINE FINDINGS Alignment: Mild cervical levocurvature. Straightening of the expected cervical lordosis. No significant spondylolisthesis. Skull base and vertebrae: The basion-dental and atlanto-dental intervals are maintained.No evidence of acute fracture to the cervical spine. Congenital nonunion of the posterior arch of C1. Soft tissues and spinal canal: No prevertebral fluid or swelling.  No visible canal hematoma. Disc levels: Cervical spondylosis with multilevel disc space narrowing, central disc protrusions, disc bulges and uncovertebral hypertrophy. Disc space narrowing is greatest at C5-C6 (severe), C6-C7 (severe) and C7-T1 (moderate/severe). Multilevel degenerative endplate irregularity. No appreciable high-grade spinal canal stenosis. Bony neural foraminal narrowing bilaterally at C5-C6. Upper chest: No consolidation within the imaged lung apices. No visible pneumothorax. IMPRESSION: CT head: 1. No evidence of acute intracranial abnormality. 2. Stable non-contrast CT appearance of the brain. 3. 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 4. Chronic infarcts within the bilateral basal ganglia and cerebellar hemispheres. 5. Background mild generalized parenchymal atrophy and mild cerebral white matter chronic small vessel ischemic disease. CT cervical spine: 1. No evidence of acute fracture to the cervical spine. 2. Nonspecific straightening of the expected cervical lordosis. 3. Mild cervical levocurvature. 4. Cervical spondylosis, as described. Electronically Signed   By: Kellie Simmering DO   On: 07/21/2020 14:36    Procedures Procedures   Medications Ordered in ED Medications - No data to display  ED Course  I have reviewed the triage vital signs and the nursing notes.  Pertinent labs & imaging results that were available during my care of the patient were reviewed by me and considered in my medical decision making (see chart for details).    MDM Rules/Calculators/A&P  75 year old lady with fall from standing, possible LOC.  On exam patient is well-appearing in no distress.  No significant trauma identified on my exam.  CT head C-spine negative, screening CXR negative.  Basic labs are stable.  Noted mild leukocytosis.  Her urine has possible UTI.  Will treat with course of antibiotics.  Recommend follow-up with primary doctor.   Discharged with daughter.  After the discussed management above, the patient was determined to be safe for discharge.  The patient was in agreement with this plan and all questions regarding their care were answered.  ED return precautions were discussed and the patient will return to the ED with any significant worsening of condition.   Final Clinical Impression(s) / ED Diagnoses Final diagnoses:  Fall, initial encounter  Leukocytosis, unspecified type  Urinary tract infection without hematuria, site unspecified    Rx / DC Orders ED Discharge Orders         Ordered    cephALEXin (KEFLEX) 500 MG capsule  4 times daily        07/21/20 1713           Lucrezia Starch, MD 07/21/20 1751

## 2020-07-21 NOTE — ED Notes (Signed)
Pt given water and crackers at this time. No other needs expressed at this time.

## 2020-07-21 NOTE — ED Triage Notes (Signed)
Pt BIB [facility] from home for unwitnessed fall in kitchen followed by LOC. Pt states she slipped on water. Small hematoma to back right of head. Denies pain. Arrives with c-collar in place. Alert to baseline. Hx dementia. No blood thinners. Started new BP med a few days ago.  BP 140/62 HR 100 RR 20 SpO2 96% RA CBG 226

## 2020-07-30 DIAGNOSIS — R569 Unspecified convulsions: Secondary | ICD-10-CM

## 2020-07-30 HISTORY — DX: Unspecified convulsions: R56.9

## 2020-12-19 ENCOUNTER — Observation Stay (HOSPITAL_BASED_OUTPATIENT_CLINIC_OR_DEPARTMENT_OTHER)
Admission: EM | Admit: 2020-12-19 | Discharge: 2020-12-22 | Disposition: A | Discharge status: Home / Self Care | Payer: Medicare PPO | Attending: Internal Medicine | Admitting: Internal Medicine

## 2020-12-19 ENCOUNTER — Encounter (HOSPITAL_BASED_OUTPATIENT_CLINIC_OR_DEPARTMENT_OTHER): Payer: Self-pay | Admitting: Emergency Medicine

## 2020-12-19 ENCOUNTER — Other Ambulatory Visit: Payer: Self-pay

## 2020-12-19 ENCOUNTER — Emergency Department (HOSPITAL_BASED_OUTPATIENT_CLINIC_OR_DEPARTMENT_OTHER): Payer: Medicare PPO

## 2020-12-19 DIAGNOSIS — I10 Essential (primary) hypertension: Secondary | ICD-10-CM | POA: Diagnosis not present

## 2020-12-19 DIAGNOSIS — R2689 Other abnormalities of gait and mobility: Secondary | ICD-10-CM | POA: Insufficient documentation

## 2020-12-19 DIAGNOSIS — Z79899 Other long term (current) drug therapy: Secondary | ICD-10-CM | POA: Insufficient documentation

## 2020-12-19 DIAGNOSIS — G9341 Metabolic encephalopathy: Secondary | ICD-10-CM | POA: Diagnosis not present

## 2020-12-19 DIAGNOSIS — N3000 Acute cystitis without hematuria: Secondary | ICD-10-CM | POA: Diagnosis not present

## 2020-12-19 DIAGNOSIS — Z853 Personal history of malignant neoplasm of breast: Secondary | ICD-10-CM | POA: Diagnosis not present

## 2020-12-19 DIAGNOSIS — A419 Sepsis, unspecified organism: Secondary | ICD-10-CM | POA: Diagnosis not present

## 2020-12-19 DIAGNOSIS — R410 Disorientation, unspecified: Secondary | ICD-10-CM

## 2020-12-19 DIAGNOSIS — B9620 Unspecified Escherichia coli [E. coli] as the cause of diseases classified elsewhere: Secondary | ICD-10-CM

## 2020-12-19 DIAGNOSIS — F0390 Unspecified dementia without behavioral disturbance: Secondary | ICD-10-CM | POA: Insufficient documentation

## 2020-12-19 DIAGNOSIS — Z20822 Contact with and (suspected) exposure to covid-19: Secondary | ICD-10-CM | POA: Diagnosis not present

## 2020-12-19 DIAGNOSIS — R531 Weakness: Secondary | ICD-10-CM | POA: Diagnosis present

## 2020-12-19 DIAGNOSIS — G9340 Encephalopathy, unspecified: Secondary | ICD-10-CM | POA: Diagnosis present

## 2020-12-19 DIAGNOSIS — R7881 Bacteremia: Secondary | ICD-10-CM

## 2020-12-19 LAB — CBC WITH DIFFERENTIAL/PLATELET
Abs Immature Granulocytes: 0.09 K/uL — ABNORMAL HIGH (ref 0.00–0.07)
Basophils Absolute: 0 K/uL (ref 0.0–0.1)
Basophils Relative: 0 %
Eosinophils Absolute: 0 K/uL (ref 0.0–0.5)
Eosinophils Relative: 0 %
HCT: 39.8 % (ref 36.0–46.0)
Hemoglobin: 13.3 g/dL (ref 12.0–15.0)
Immature Granulocytes: 1 %
Lymphocytes Relative: 4 %
Lymphs Abs: 0.7 K/uL (ref 0.7–4.0)
MCH: 30 pg (ref 26.0–34.0)
MCHC: 33.4 g/dL (ref 30.0–36.0)
MCV: 89.6 fL (ref 80.0–100.0)
Monocytes Absolute: 1.1 K/uL — ABNORMAL HIGH (ref 0.1–1.0)
Monocytes Relative: 6 %
Neutro Abs: 15.9 K/uL — ABNORMAL HIGH (ref 1.7–7.7)
Neutrophils Relative %: 89 %
Platelets: 205 K/uL (ref 150–400)
RBC: 4.44 MIL/uL (ref 3.87–5.11)
RDW: 13.1 % (ref 11.5–15.5)
WBC: 17.8 K/uL — ABNORMAL HIGH (ref 4.0–10.5)
nRBC: 0 % (ref 0.0–0.2)

## 2020-12-19 LAB — COMPREHENSIVE METABOLIC PANEL WITH GFR
ALT: 31 U/L (ref 0–44)
AST: 39 U/L (ref 15–41)
Albumin: 3.2 g/dL — ABNORMAL LOW (ref 3.5–5.0)
Alkaline Phosphatase: 93 U/L (ref 38–126)
Anion gap: 7 (ref 5–15)
BUN: 15 mg/dL (ref 8–23)
CO2: 23 mmol/L (ref 22–32)
Calcium: 9.4 mg/dL (ref 8.9–10.3)
Chloride: 105 mmol/L (ref 98–111)
Creatinine, Ser: 1.05 mg/dL — ABNORMAL HIGH (ref 0.44–1.00)
GFR, Estimated: 55 mL/min — ABNORMAL LOW
Glucose, Bld: 184 mg/dL — ABNORMAL HIGH (ref 70–99)
Potassium: 3.7 mmol/L (ref 3.5–5.1)
Sodium: 135 mmol/L (ref 135–145)
Total Bilirubin: 0.8 mg/dL (ref 0.3–1.2)
Total Protein: 6.6 g/dL (ref 6.5–8.1)

## 2020-12-19 MED ORDER — SODIUM CHLORIDE 0.9 % IV BOLUS
1000.0000 mL | Freq: Once | INTRAVENOUS | Status: AC
  Administered 2020-12-19: 1000 mL via INTRAVENOUS

## 2020-12-19 NOTE — ED Triage Notes (Signed)
Pt to ED via [facility]. Daughter called EMS stating pt with increased weakness, confusion and strong urine smell. Pt is urinary incontinent.

## 2020-12-19 NOTE — ED Notes (Signed)
ED Provider at bedside. 

## 2020-12-19 NOTE — ED Notes (Signed)
Pt. Is alert to self and situation but has noted confusion.

## 2020-12-19 NOTE — ED Notes (Signed)
Pt. Has pur wick in place to collect urine.  Pt. Daughter want [place] to collect urine this way if possible due to Pt. Has not had in and out cath done and has had pur wick in past.

## 2020-12-19 NOTE — ED Provider Notes (Signed)
Emergency Department Provider Note   I have reviewed the triage vital signs and the nursing notes.   HISTORY  Chief Complaint Weakness   HPI Olivia Ross is a 75 y.o. female with past medical history reviewed below presents emergency department with generalized weakness, near syncope, foul-smelling urine.  Symptoms of been ongoing for the past 36 hours.  Most of the history is provided by the patient's family member at bedside who is also the caregiver.  She noticed weakness yesterday which is progressively worsened.  Patient seems more generally weak with no unilateral symptoms.  Today, she got home from work and noticed that the patient continued to be weak and more confused than normal.  She smelled strongly of urine and a family member suspects a urinary tract infection.  Patient denies any pain.  Daughter states they did have a ED visit to [facility] back in April after a fall and notes weakness and general decline since that time.   Level 5 caveat: Confusion   Past Medical History:  Diagnosis Date   Cancer [facility])    Hypertension     Patient Active Problem List   Diagnosis Date Noted   Acute encephalopathy 12/20/2020    History reviewed. No pertinent surgical history.  Allergies Dtap-hepatitis b recomb-ipv, Norco [hydrocodone-acetaminophen], and Percocet [oxycodone-acetaminophen]  No family history on file.  Social History    Review of Systems  Level 5 caveat: AMS   ____________________________________________   PHYSICAL EXAM:  VITAL SIGNS: ED Triage Vitals  Enc Vitals Group     BP 12/19/20 2200 129/63     Pulse Rate 12/19/20 2200 81     Resp 12/19/20 2200 16     Temp 12/19/20 2159 98.8 F (37.1 C)     Temp Source 12/19/20 2159 Oral     SpO2 12/19/20 2200 100 %     Weight 12/19/20 2155 147 lb 4.3 oz (66.8 kg)     Height 12/19/20 2155 5\' 5"  (1.651 m)   Constitutional: Alert and oriented to place and person only. Well appearing and in no acute  distress. Eyes: Conjunctivae are normal.  Head: Atraumatic. Nose: No congestion/rhinnorhea. Mouth/Throat: Mucous membranes are moist.   Neck: No stridor.   Cardiovascular: Normal rate, regular rhythm. Good peripheral circulation. Grossly normal heart sounds.   Respiratory: Normal respiratory effort.  No retractions. Lungs CTAB. Gastrointestinal: Soft and nontender. No distention.  Musculoskeletal: No lower extremity tenderness nor edema. No gross deformities of extremities. Neurologic:  Normal speech and language. No gross focal neurologic deficits are appreciated.  Skin:  Skin is warm, dry and intact. No rash noted. No sacral ulcer or other rash.   ____________________________________________   LABS (all labs ordered are listed, but only abnormal results are displayed)  Labs Reviewed  CBC WITH DIFFERENTIAL/PLATELET - Abnormal; Notable for the following components:      Result Value   WBC 17.8 (*)    Neutro Abs 15.9 (*)    Monocytes Absolute 1.1 (*)    Abs Immature Granulocytes 0.09 (*)    All other components within normal limits  COMPREHENSIVE METABOLIC PANEL - Abnormal; Notable for the following components:   Glucose, Bld 184 (*)    Creatinine, Ser 1.05 (*)    Albumin 3.2 (*)    GFR, Estimated 55 (*)    All other components within normal limits  URINALYSIS, ROUTINE W REFLEX MICROSCOPIC - Abnormal; Notable for the following components:   APPearance HAZY (*)    Hgb urine dipstick SMALL (*)  Protein, ur 30 (*)    Leukocytes,Ua LARGE (*)    All other components within normal limits  URINALYSIS, MICROSCOPIC (REFLEX) - Abnormal; Notable for the following components:   Bacteria, UA MANY (*)    Non Squamous Epithelial PRESENT (*)    All other components within normal limits  LACTIC ACID, PLASMA - Abnormal; Notable for the following components:   Lactic Acid, Venous 2.3 (*)    All other components within normal limits  RESP PANEL BY RT-PCR (FLU A&B, COVID) ARPGX2  URINE  CULTURE  CULTURE, BLOOD (ROUTINE X 2)  CULTURE, BLOOD (ROUTINE X 2)  LACTIC ACID, PLASMA   ____________________________________________  EKG   EKG Interpretation  Date/Time:  Tuesday December 19 2020 23:21:40 EDT Ventricular Rate:  64 PR Interval:  160 QRS Duration: 75 QT Interval:  373 QTC Calculation: 385 R Axis:   52 Text Interpretation: Sinus rhythm Probable anteroseptal infarct, old Confirmed by Nanda Quinton [phone]) on 12/19/2020 11:58:48 PM        ____________________________________________  RADIOLOGY  CT HEAD WO CONTRAST (5MM)  Result Date: 12/20/2020 CLINICAL DATA:  Altered mental status. EXAM: CT HEAD WITHOUT CONTRAST TECHNIQUE: Contiguous axial images were obtained from the base of the skull through the vertex without intravenous contrast. COMPARISON:  Head CT dated 07/21/2020. FINDINGS: Brain: Mild age-related atrophy and chronic microvascular ischemic changes. 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 Small old left frontal infarcts. Small old cerebellar infarcts noted. There is no acute intracranial hemorrhage. No mass effect or midline shift no extra-axial fluid collection. Vascular: No hyperdense vessel or unexpected calcification. Skull: Normal. Negative for fracture or focal lesion. Sinuses/Orbits: No acute finding. Other: None IMPRESSION: 1. No acute intracranial pathology. 2. Age-related atrophy and chronic microvascular ischemic changes. 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 Electronically Signed   By: Anner Crete M.D.   On: 12/20/2020 00:10   DG Pelvis Portable  Result Date: 12/20/2020 CLINICAL DATA:  Pain with ambulation.  Weakness and confusion. EXAM: PORTABLE PELVIS 1-2 VIEWS COMPARISON:  None. FINDINGS: Patient is rotated. The bones are diffusely under mineralized. No fracture. Mild bilateral hip joint space narrowing. No  evidence of a vascular necrosis or focal bone lesion. Pubic symphysis and sacroiliac joints are congruent. Calcifications projecting superolateral to the left iliac crest likely represent gluteal granuloma. IMPRESSION: Osteopenia/osteoporosis. Mild bilateral hip joint space narrowing. No acute findings. Electronically Signed   By: Keith Rake M.D.   On: 12/20/2020 00:03   DG Chest Portable 1 View  Result Date: 12/20/2020 CLINICAL DATA:  Altered mental status EXAM: PORTABLE CHEST 1 VIEW COMPARISON:  07/21/2020 FINDINGS: The heart size and mediastinal contours are within normal limits. Aortic atherosclerosis. Both lungs are clear. No pleural effusion or pneumothorax. No acute osseous abnormality. Surgical clips overlie the right chest wall. IMPRESSION: No active disease. Aortic Atherosclerosis (ICD10-I70.0). Electronically Signed   By: Merilyn Baba M.D.   On: 12/20/2020 00:02    ____________________________________________   PROCEDURES  Procedure(s) performed:   Procedures  CRITICAL CARE Performed by: Margette Fast Total critical care time: 35 minutes Critical care time was exclusive of separately billable procedures and treating other patients. Critical care was necessary to treat or prevent imminent or life-threatening deterioration. Critical care was time spent personally by me on the following activities: development of treatment plan with patient and/or surrogate as well as nursing, discussions with consultants, evaluation of patient's response  to treatment, examination of patient, obtaining history from patient or surrogate, ordering and performing treatments and interventions, ordering and review of laboratory studies, ordering and review of radiographic studies, pulse oximetry and re-evaluation of patient's condition.  Nanda Quinton, MD Emergency Medicine  ____________________________________________   INITIAL IMPRESSION / ASSESSMENT AND PLAN / ED COURSE  Pertinent labs &  imaging results that were available during my care of the patient were reviewed by me and considered in my medical decision making (see chart for details).   Patient presents emergency department generalized weakness and foul-smelling urine.  She has significant leukocytosis.  Possible urine infection.  No clear skin infection on exam.  Some weakness and lightheadedness/near syncope symptoms today.  EKG shows no acute ischemic change or arrhythmia.  Plan for COVID testing along with CT imaging of the head, chest x-ray, UA, and fluids. No findings to strongly suspect sepsis at this time.   Labs are consistent with urinary tract infection.  Patient has developed some tachycardia and I added on a lactic acid which came back mildly elevated at 2.3.  And reevaluated the patient she seems slightly more confused and shifting around in bed.  She does not appear particularly uncomfortable.  She is managing her airway.  She is not becoming hypotensive.  Plan to continue IV fluids and have added Rocephin for treatment of a likely urine source for infection.  Abdomen remains soft.  Do not feel advanced imaging is required at this time.   Discussed patient's case with [facility] to request admission. Patient and family (if present) updated with plan. Care transferred to [facility] service.  I reviewed all nursing notes, vitals, pertinent old records, EKGs, labs, imaging (as available).  ____________________________________________  FINAL CLINICAL IMPRESSION(S) / ED DIAGNOSES  Final diagnoses:  Generalized weakness  Acute cystitis without hematuria  Disorientation    MEDICATIONS GIVEN DURING THIS VISIT:  Medications  cefTRIAXone (ROCEPHIN) 1 g in sodium chloride 0.9 % 100 mL IVPB (1 g Intravenous New Bag/Given 12/20/20 0323)  lactated ringers infusion ( Intravenous New Bag/Given 12/20/20 0310)  sodium chloride 0.9 % bolus 1,000 mL (0 mLs Intravenous Stopped 12/19/20 2357)  lactated ringers bolus 500 mL (500 mLs  Intravenous New Bag/Given 12/20/20 0313)     Note:  This document was prepared using Dragon voice recognition software and may include unintentional dictation errors.  Nanda Quinton, MD, [facility] Emergency Medicine    Izaan Kingbird, [facility], MD 12/20/20 [phone]

## 2020-12-20 DIAGNOSIS — A419 Sepsis, unspecified organism: Secondary | ICD-10-CM | POA: Diagnosis not present

## 2020-12-20 DIAGNOSIS — G9340 Encephalopathy, unspecified: Secondary | ICD-10-CM | POA: Diagnosis not present

## 2020-12-20 DIAGNOSIS — G9341 Metabolic encephalopathy: Secondary | ICD-10-CM | POA: Diagnosis not present

## 2020-12-20 DIAGNOSIS — Z853 Personal history of malignant neoplasm of breast: Secondary | ICD-10-CM | POA: Diagnosis not present

## 2020-12-20 DIAGNOSIS — N3000 Acute cystitis without hematuria: Secondary | ICD-10-CM

## 2020-12-20 DIAGNOSIS — Z20822 Contact with and (suspected) exposure to covid-19: Secondary | ICD-10-CM | POA: Diagnosis not present

## 2020-12-20 DIAGNOSIS — Z79899 Other long term (current) drug therapy: Secondary | ICD-10-CM | POA: Diagnosis not present

## 2020-12-20 DIAGNOSIS — I10 Essential (primary) hypertension: Secondary | ICD-10-CM | POA: Diagnosis not present

## 2020-12-20 DIAGNOSIS — F0390 Unspecified dementia without behavioral disturbance: Secondary | ICD-10-CM | POA: Diagnosis not present

## 2020-12-20 DIAGNOSIS — R410 Disorientation, unspecified: Secondary | ICD-10-CM | POA: Diagnosis not present

## 2020-12-20 DIAGNOSIS — R531 Weakness: Secondary | ICD-10-CM

## 2020-12-20 LAB — BLOOD CULTURE ID PANEL (REFLEXED) - BCID2

## 2020-12-20 LAB — URINALYSIS, MICROSCOPIC (REFLEX): WBC, UA: >50 WBC/hpf (ref 0–5)

## 2020-12-20 LAB — URINALYSIS, ROUTINE W REFLEX MICROSCOPIC
Bilirubin Urine: NEGATIVE
Glucose, UA: NEGATIVE mg/dL
Ketones, ur: NEGATIVE mg/dL
Nitrite: NEGATIVE
Protein, ur: 30 mg/dL — AB
Specific Gravity, Urine: 1.02 (ref 1.005–1.030)
pH: 6.5 (ref 5.0–8.0)

## 2020-12-20 LAB — RESP PANEL BY RT-PCR (FLU A&B, COVID) ARPGX2
Influenza A by PCR: NEGATIVE
Influenza B by PCR: NEGATIVE
SARS Coronavirus 2 by RT PCR: NEGATIVE

## 2020-12-20 LAB — LACTIC ACID, PLASMA
Lactic Acid, Venous: 2.3 mmol/L (ref 0.5–1.9)
Lactic Acid, Venous: 3.2 mmol/L (ref 0.5–1.9)
Lactic Acid, Venous: 0.8 mmol/L (ref 0.5–1.9)
Lactic Acid, Venous: 2.3 mmol/L (ref 0.5–1.9)

## 2020-12-20 MED ORDER — AMLODIPINE BESYLATE 5 MG PO TABS: 5.0000 mg | ORAL_TABLET | Freq: Every day | ORAL | Status: DC | PRN

## 2020-12-20 MED ORDER — SODIUM CHLORIDE 0.9 % IV SOLN
2.0000 g | INTRAVENOUS | Status: DC
  Administered 2020-12-20 – 2020-12-21 (×2): 2 g via INTRAVENOUS
  Filled 2020-12-20 (×2): qty 20

## 2020-12-20 MED ORDER — ENOXAPARIN SODIUM 40 MG/0.4ML IJ SOSY
40.0000 mg | PREFILLED_SYRINGE | INTRAMUSCULAR | Status: DC
  Administered 2020-12-20 – 2020-12-21 (×2): 40 mg via SUBCUTANEOUS
  Filled 2020-12-20 (×2): qty 0.4

## 2020-12-20 MED ORDER — SODIUM CHLORIDE 0.9 % IV BOLUS
1000.0000 mL | Freq: Once | INTRAVENOUS | Status: AC
  Administered 2020-12-20: 1000 mL via INTRAVENOUS

## 2020-12-20 MED ORDER — DONEPEZIL HCL 10 MG PO TABS
10.0000 mg | ORAL_TABLET | Freq: Every day | ORAL | Status: DC
  Filled 2020-12-20: qty 1

## 2020-12-20 MED ORDER — SODIUM CHLORIDE 0.9 % IV SOLN
1.0000 g | INTRAVENOUS | Status: DC
  Filled 2020-12-20: qty 10

## 2020-12-20 MED ORDER — LACTATED RINGERS IV SOLN: INTRAVENOUS | Status: DC

## 2020-12-20 MED ORDER — AMLODIPINE BESYLATE 5 MG PO TABS
5.0000 mg | ORAL_TABLET | Freq: Once | ORAL | Status: AC
  Administered 2020-12-20: 5 mg via ORAL
  Filled 2020-12-20: qty 1

## 2020-12-20 MED ORDER — SODIUM CHLORIDE 0.9 % IV SOLN
1.0000 g | Freq: Once | INTRAVENOUS | Status: AC
  Administered 2020-12-20: 1 g via INTRAVENOUS
  Filled 2020-12-20: qty 10

## 2020-12-20 MED ORDER — ADULT MULTIVITAMIN W/MINERALS CH
1.0000 | ORAL_TABLET | Freq: Every day | ORAL | Status: DC
  Administered 2020-12-20 – 2020-12-22 (×3): 1 via ORAL
  Filled 2020-12-20 (×3): qty 1

## 2020-12-20 MED ORDER — LACTATED RINGERS IV BOLUS
500.0000 mL | Freq: Once | INTRAVENOUS | Status: AC
  Administered 2020-12-20: 500 mL via INTRAVENOUS

## 2020-12-20 MED ORDER — LETROZOLE 2.5 MG PO TABS
2.5000 mg | ORAL_TABLET | Freq: Every day | ORAL | Status: DC
  Administered 2020-12-20 – 2020-12-22 (×3): 2.5 mg via ORAL
  Filled 2020-12-20 (×3): qty 1

## 2020-12-20 MED ORDER — DICYCLOMINE HCL 10 MG PO CAPS
10.0000 mg | ORAL_CAPSULE | Freq: Every day | ORAL | Status: DC | PRN
  Filled 2020-12-20: qty 1

## 2020-12-20 MED ORDER — POLYVINYL ALCOHOL 1.4 % OP SOLN
1.0000 [drp] | Freq: Three times a day (TID) | OPHTHALMIC | Status: DC | PRN
  Filled 2020-12-20: qty 15

## 2020-12-20 MED ORDER — IRBESARTAN 150 MG PO TABS
150.0000 mg | ORAL_TABLET | Freq: Every day | ORAL | Status: DC
  Administered 2020-12-21 – 2020-12-22 (×2): 150 mg via ORAL
  Filled 2020-12-20 (×2): qty 1

## 2020-12-20 MED ORDER — ACETAMINOPHEN 500 MG PO TABS
1000.0000 mg | ORAL_TABLET | Freq: Once | ORAL | Status: AC
  Administered 2020-12-20: 1000 mg via ORAL
  Filled 2020-12-20: qty 2

## 2020-12-20 MED ORDER — DONEPEZIL HCL 10 MG PO TABS
10.0000 mg | ORAL_TABLET | Freq: Every day | ORAL | Status: DC
  Administered 2020-12-20 – 2020-12-21 (×2): 10 mg via ORAL
  Filled 2020-12-20 (×2): qty 1

## 2020-12-20 MED ORDER — HYDRALAZINE HCL 25 MG PO TABS
25.0000 mg | ORAL_TABLET | Freq: Four times a day (QID) | ORAL | Status: DC | PRN
  Administered 2020-12-20 – 2020-12-21 (×3): 25 mg via ORAL
  Filled 2020-12-20 (×4): qty 1

## 2020-12-20 MED ORDER — ACETAMINOPHEN 500 MG PO TABS
500.0000 mg | ORAL_TABLET | Freq: Four times a day (QID) | ORAL | Status: DC | PRN
  Administered 2020-12-20 – 2020-12-21 (×2): 500 mg via ORAL
  Filled 2020-12-20 (×2): qty 1

## 2020-12-20 NOTE — ED Notes (Signed)
Explained to Dr. Laverta Baltimore EDP that the daughter of the Pt. Wanted [place] to wait to Cath the Pt.  Daughter of the Pt. Wants to see if we can get urine from the [facility] and not traumatize the Pt. With an In and out Cath,

## 2020-12-20 NOTE — H&P (Addendum)
History and Physical    Olivia Ross JYN:[id number] DOB: 1945/09/06 DOA: 12/19/2020  PCP: Jolinda Croak, MD (Confirm with patient/family/NH records and if not entered, this has to be entered at [facility] point of entry) Patient coming from: Home  I have personally briefly reviewed patient's old medical records in [place]  Chief Complaint:   HPI: Olivia Ross is a 75 y.o. 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  Patient does not remember what happened yesterday, most history provided by patient daughter over the phone.  Daughter reported that patient has baseline dementia, diagnosed 2 years ago on Aricept, at baseline, patient oriented to person and time and frequently confused about time.  Last month, patient had a episode of UTI when patient was more confused than baseline, at that time patient also has had loose stool, and daughter attributed to UTI to the contamination of diarrhea to the urine.  Patient went to [facility] and had 7 days of p.o. antibiotics and confusion improved.  However, soon, daughter noticed the patient has had increasing confusion again.  This time, patient's diarrhea has resolved.  But daughter does report strong smelling urine, and she wonders whether " treat UTI with really completely treated".  Denies any fever at home.  ED Course: On admission, blood pressure fluctuated with systolic [id number], blood work showed elevated lactate 2.3, WBC 17.  UA showed pyuria.  IV fluids and ceftriaxone started.  Review of Systems: As per HPI otherwise 14 point review of systems negative.    Past Medical History:  Diagnosis Date   Cancer [facility])    Hypertension     History reviewed. No pertinent surgical history.   has no history on file for tobacco use, alcohol use, and drug use.  Allergies  Allergen Reactions   Diphth-Acell  Pertussis-Tetanus     Other reaction(s): Other (See Comments)   Dtap-Hepatitis B Recomb-Ipv Other (See Comments)    unknown   Norco [Hydrocodone-Acetaminophen] Nausea And Vomiting   Percocet [Oxycodone-Acetaminophen] Nausea And Vomiting    No family history on file.   Prior to Admission medications   Medication Sig Start Date End Date Taking? Authorizing Provider  acetaminophen (TYLENOL) 500 MG tablet Take 500 mg by mouth every 6 (six) hours as needed for moderate pain.   Yes [provider]  amLODipine (NORVASC) 5 MG tablet Take 5 mg by mouth daily as needed (if blodd pressure is over 120). 11/02/20  Yes [provider]  carboxymethylcellulose (REFRESH PLUS) 0.5 % SOLN Place 1 drop into both eyes 3 (three) times daily as needed (dry eyes).   Yes [provider]  dicyclomine (BENTYL) 10 MG capsule Take 10 mg by mouth daily as needed for spasms. 07/24/20  Yes [provider]  donepezil (ARICEPT) 10 MG tablet Take 10 mg by mouth at bedtime. 05/02/20  Yes [provider]  letrozole (FEMARA) 2.5 MG tablet Take 2.5 mg by mouth daily. 12/12/20  Yes [provider]  Multiple Vitamin (MULTIVITAMIN WITH MINERALS) TABS tablet Take 1 tablet by mouth daily. Centrum   Yes [provider]  valsartan (DIOVAN) 160 MG tablet Take 160 mg by mouth daily. 12/12/20  Yes [provider]    Physical Exam: Vitals:   12/20/20 1230 12/20/20 1346 12/20/20 1530 12/20/20 1645  BP: (!) 111/56 127/61 (!) 131/59 118/61  Pulse: 71 62 80 65  Resp: 13 16 (!) 23  20  Temp:    97.9 F (36.6 C)  TempSrc:    Oral  SpO2: 99% 100% 100% 98%  Weight:      Height:        Constitutional: NAD, calm, comfortable Vitals:   12/20/20 1230 12/20/20 1346 12/20/20 1530 12/20/20 1645  BP: (!) 111/56 127/61 (!) 131/59 118/61  Pulse: 71 62 80 65  Resp: 13 16 (!) 23 20  Temp:    97.9 F (36.6 C)  TempSrc:    Oral  SpO2: 99% 100% 100% 98%  Weight:      Height:        Eyes: PERRL, lids and conjunctivae normal ENMT: Mucous membranes are moist. Posterior pharynx clear of any exudate or lesions.Normal dentition.  Neck: normal, supple, no masses, no thyromegaly Respiratory: clear to auscultation bilaterally, no wheezing, no crackles. Normal respiratory effort. No accessory muscle use.  Cardiovascular: Regular rate and rhythm, no murmurs / rubs / gallops. No extremity edema. 2+ pedal pulses. No carotid bruits.  Abdomen: no tenderness, no masses palpated. No hepatosplenomegaly. Bowel sounds positive.  Musculoskeletal: no clubbing / cyanosis. No joint deformity upper and lower extremities. Good ROM, no contractures. Normal muscle tone.  Skin: no rashes, lesions, ulcers. No induration Neurologic: CN 2-12 grossly intact. Sensation intact, DTR normal. Strength 5/5 in all 4.  Psychiatric: Oriented to person and place, confused about time.    Labs on Admission: I have personally reviewed following labs and imaging studies  CBC: Recent Labs  Lab 12/19/20 2227  WBC 17.8*  NEUTROABS 15.9*  HGB 13.3  HCT 39.8  MCV 89.6  PLT 564   Basic Metabolic Panel: Recent Labs  Lab 12/19/20 2227  NA 135  K 3.7  CL 105  CO2 23  GLUCOSE 184*  BUN 15  CREATININE 1.05*  CALCIUM 9.4   GFR: Estimated Creatinine Clearance: 40.4 mL/min (A) (by C-G formula based on SCr of 1.05 mg/dL (H)). Liver Function Tests: Recent Labs  Lab 12/19/20 2227  AST 39  ALT 31  ALKPHOS 93  BILITOT 0.8  PROT 6.6  ALBUMIN 3.2*   No results for input(s): LIPASE, AMYLASE in the last 168 hours. No results for input(s): AMMONIA in the last 168 hours. Coagulation Profile: No results for input(s): INR, PROTIME in the last 168 hours. Cardiac Enzymes: No results for input(s): CKTOTAL, CKMB, CKMBINDEX, TROPONINI in the last 168 hours. BNP (last 3 results) No results for input(s): PROBNP in the last 8760 hours. HbA1C: No results for input(s): HGBA1C in the last 72 hours. CBG: No  results for input(s): GLUCAP in the last 168 hours. Lipid Profile: No results for input(s): CHOL, HDL, LDLCALC, TRIG, CHOLHDL, LDLDIRECT in the last 72 hours. Thyroid Function Tests: No results for input(s): TSH, T4TOTAL, FREET4, T3FREE, THYROIDAB in the last 72 hours. Anemia Panel: No results for input(s): VITAMINB12, FOLATE, FERRITIN, TIBC, IRON, RETICCTPCT in the last 72 hours. Urine analysis:    Component Value Date/Time   COLORURINE YELLOW 12/20/2020 0125   APPEARANCEUR HAZY (A) 12/20/2020 0125   LABSPEC 1.020 12/20/2020 0125   PHURINE 6.5 12/20/2020 0125   GLUCOSEU NEGATIVE 12/20/2020 0125   HGBUR SMALL (A) 12/20/2020 0125   BILIRUBINUR NEGATIVE 12/20/2020 0125   KETONESUR NEGATIVE 12/20/2020 0125   PROTEINUR 30 (A) 12/20/2020 0125   NITRITE NEGATIVE 12/20/2020 0125   LEUKOCYTESUR LARGE (A) 12/20/2020 0125    Radiological Exams on Admission: CT HEAD WO CONTRAST (5MM)  Result Date: 12/20/2020 CLINICAL DATA:  Altered mental status. EXAM:  CT HEAD WITHOUT CONTRAST TECHNIQUE: Contiguous axial images were obtained from the base of the skull through the vertex without intravenous contrast. COMPARISON:  Head CT dated 07/21/2020. FINDINGS: Brain: Mild age-related atrophy and chronic microvascular ischemic changes. 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 Small old left frontal infarcts. Small old cerebellar infarcts noted. There is no acute intracranial hemorrhage. No mass effect or midline shift no extra-axial fluid collection. Vascular: No hyperdense vessel or unexpected calcification. Skull: Normal. Negative for fracture or focal lesion. Sinuses/Orbits: No acute finding. Other: None IMPRESSION: 1. No acute intracranial pathology. 2. Age-related atrophy and chronic microvascular ischemic changes. 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 Electronically Signed   By:  Anner Crete M.D.   On: 12/20/2020 00:10   DG Pelvis Portable  Result Date: 12/20/2020 CLINICAL DATA:  Pain with ambulation.  Weakness and confusion. EXAM: PORTABLE PELVIS 1-2 VIEWS COMPARISON:  None. FINDINGS: Patient is rotated. The bones are diffusely under mineralized. No fracture. Mild bilateral hip joint space narrowing. No evidence of a vascular necrosis or focal bone lesion. Pubic symphysis and sacroiliac joints are congruent. Calcifications projecting superolateral to the left iliac crest likely represent gluteal granuloma. IMPRESSION: Osteopenia/osteoporosis. Mild bilateral hip joint space narrowing. No acute findings. Electronically Signed   By: Keith Rake M.D.   On: 12/20/2020 00:03   DG Chest Portable 1 View  Result Date: 12/20/2020 CLINICAL DATA:  Altered mental status EXAM: PORTABLE CHEST 1 VIEW COMPARISON:  07/21/2020 FINDINGS: The heart size and mediastinal contours are within normal limits. Aortic atherosclerosis. Both lungs are clear. No pleural effusion or pneumothorax. No acute osseous abnormality. Surgical clips overlie the right chest wall. IMPRESSION: No active disease. Aortic Atherosclerosis (ICD10-I70.0). Electronically Signed   By: Merilyn Baba M.D.   On: 12/20/2020 00:02    EKG: Independently reviewed. Sinus, no acute ST changes  Assessment/Plan Active Problems:   Acute encephalopathy   Encephalopathy  (please populate well all problems here in Problem List. (For example, if patient is on BP meds at home and you resume or decide to hold them, it is a problem that needs to be her. Same for CAD, COPD, HLD and so on)  Acute metabolic encephalopathy -Secondary to UTI, appears to be improving clinically. -Continue ceftriaxone, avoid sedation medications. -CT head negative for acute findings.  Sepsis -Evidenced by increasing lactate level, and WBC count, infectious source is UTI. -Check care everywhere, did not see urine culture result from last month.  As  patient clinically is improving, will continue current ceftriaxone regimen while waiting for urine culture result.  Dementia -Continue Aricept. -Outpatient follow-up with dementia clinic at [place].  Consult case management for home care service, currently family plan to take care of patient at home.  HTN -Blood pressure fluctuated earlier today, hold amlodipine, start as needed hydralazine, resume ARB in the morning. -Orthostatic vital signs.  Deconditioning -PT evaluation.  Elevated glucose -No Hx of DM, check A1C.  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  DVT prophylaxis: Lovenox Code Status: Full code Family Communication: Daughter over the phone Disposition Plan: Expect less than 2 midnight hospital stay Consults called: None Admission status: MedSurg observation   Lequita Halt MD [facility] Pager [phone]  12/20/2020, 6:22 PM

## 2020-12-20 NOTE — Progress Notes (Signed)
PHARMACY - PHYSICIAN COMMUNICATION CRITICAL VALUE ALERT - BLOOD CULTURE IDENTIFICATION (BCID)  Olivia Ross is an 75 y.o. female who presented to [facility] on 12/19/2020 with a chief complaint of sepsis  Assessment:  WBC elevated, last temp 99.2  Name of physician (or Provider) Contacted: Dr. Cyd Silence  Current antibiotics: Ceftriaxone 1g IV q24h  Changes to prescribed antibiotics recommended:  Inc Ceftriaxone to 2g IV q24h  Results for orders placed or performed during the hospital encounter of 12/19/20  Blood Culture ID Panel (Reflexed) (Collected: 12/20/2020  3:00 AM)  Result Value Ref Range   Enterococcus faecalis NOT DETECTED NOT DETECTED   Enterococcus Faecium NOT DETECTED NOT DETECTED   Listeria monocytogenes NOT DETECTED NOT DETECTED   Staphylococcus species NOT DETECTED NOT DETECTED   Staphylococcus aureus (BCID) NOT DETECTED NOT DETECTED   Staphylococcus epidermidis NOT DETECTED NOT DETECTED   Staphylococcus lugdunensis NOT DETECTED NOT DETECTED   Streptococcus species NOT DETECTED NOT DETECTED   Streptococcus agalactiae NOT DETECTED NOT DETECTED   Streptococcus pneumoniae NOT DETECTED NOT DETECTED   Streptococcus pyogenes NOT DETECTED NOT DETECTED   A.calcoaceticus-baumannii NOT DETECTED NOT DETECTED   Bacteroides fragilis NOT DETECTED NOT DETECTED   Enterobacterales DETECTED (A) NOT DETECTED   Enterobacter cloacae complex NOT DETECTED NOT DETECTED   Escherichia coli DETECTED (A) NOT DETECTED   Klebsiella aerogenes NOT DETECTED NOT DETECTED   Klebsiella oxytoca NOT DETECTED NOT DETECTED   Klebsiella pneumoniae NOT DETECTED NOT DETECTED   Proteus species NOT DETECTED NOT DETECTED   Salmonella species NOT DETECTED NOT DETECTED   Serratia marcescens NOT DETECTED NOT DETECTED   Haemophilus influenzae NOT DETECTED NOT DETECTED   Neisseria meningitidis NOT DETECTED NOT DETECTED   Pseudomonas aeruginosa NOT DETECTED NOT DETECTED   Stenotrophomonas maltophilia NOT  DETECTED NOT DETECTED   Candida albicans NOT DETECTED NOT DETECTED   Candida auris NOT DETECTED NOT DETECTED   Candida glabrata NOT DETECTED NOT DETECTED   Candida krusei NOT DETECTED NOT DETECTED   Candida parapsilosis NOT DETECTED NOT DETECTED   Candida tropicalis NOT DETECTED NOT DETECTED   Cryptococcus neoformans/gattii NOT DETECTED NOT DETECTED   CTX-M ESBL NOT DETECTED NOT DETECTED   Carbapenem resistance IMP NOT DETECTED NOT DETECTED   Carbapenem resistance KPC NOT DETECTED NOT DETECTED   Carbapenem resistance NDM NOT DETECTED NOT DETECTED   Carbapenem resist OXA 48 LIKE NOT DETECTED NOT DETECTED   Carbapenem resistance VIM NOT DETECTED NOT DETECTED    Olivia Ross 12/20/2020  10:22 PM

## 2020-12-20 NOTE — ED Notes (Signed)
Pt given PB Crackers and water to drink. Daughter at bedside.

## 2020-12-20 NOTE — Plan of Care (Signed)
  Problem: Clinical Measurements: Goal: Ability to maintain clinical measurements within normal limits will improve Outcome: Progressing Goal: Diagnostic test results will improve Outcome: Progressing Goal: Respiratory complications will improve Outcome: Progressing Goal: Cardiovascular complication will be avoided Outcome: Progressing   Problem: Nutrition: Goal: Adequate nutrition will be maintained Outcome: Progressing   Problem: Elimination: Goal: Will not experience complications related to bowel motility Outcome: Progressing Goal: Will not experience complications related to urinary retention Outcome: Progressing   Problem: Pain Managment: Goal: General experience of comfort will improve Outcome: Progressing   Problem: Safety: Goal: Ability to remain free from injury will improve Outcome: Progressing   Problem: Skin Integrity: Goal: Risk for impaired skin integrity will decrease Outcome: Progressing

## 2020-12-20 NOTE — ED Notes (Signed)
Pt given gingerale and crackers 

## 2020-12-20 NOTE — ED Notes (Signed)
Urine from pure wick drain

## 2020-12-21 DIAGNOSIS — Z853 Personal history of malignant neoplasm of breast: Secondary | ICD-10-CM

## 2020-12-21 DIAGNOSIS — R7881 Bacteremia: Secondary | ICD-10-CM | POA: Diagnosis not present

## 2020-12-21 DIAGNOSIS — N3000 Acute cystitis without hematuria: Secondary | ICD-10-CM

## 2020-12-21 DIAGNOSIS — A419 Sepsis, unspecified organism: Secondary | ICD-10-CM | POA: Diagnosis not present

## 2020-12-21 DIAGNOSIS — B9620 Unspecified Escherichia coli [E. coli] as the cause of diseases classified elsewhere: Secondary | ICD-10-CM

## 2020-12-21 DIAGNOSIS — R7309 Other abnormal glucose: Secondary | ICD-10-CM

## 2020-12-21 DIAGNOSIS — R6520 Severe sepsis without septic shock: Secondary | ICD-10-CM

## 2020-12-21 DIAGNOSIS — Z8673 Personal history of transient ischemic attack (TIA), and cerebral infarction without residual deficits: Secondary | ICD-10-CM

## 2020-12-21 LAB — BASIC METABOLIC PANEL
Anion gap: 10 (ref 5–15)
BUN: 7 mg/dL — ABNORMAL LOW (ref 8–23)
CO2: 20 mmol/L — ABNORMAL LOW (ref 22–32)
Calcium: 9.3 mg/dL (ref 8.9–10.3)
Chloride: 105 mmol/L (ref 98–111)
Creatinine, Ser: 0.73 mg/dL (ref 0.44–1.00)
GFR, Estimated: >60 mL/min (ref >60)
Glucose, Bld: 73 mg/dL (ref 70–99)
Potassium: 3.5 mmol/L (ref 3.5–5.1)
Sodium: 135 mmol/L (ref 135–145)

## 2020-12-21 LAB — CBC
HCT: 38.7 % (ref 36.0–46.0)
Hemoglobin: 12.3 g/dL (ref 12.0–15.0)
MCH: 29 pg (ref 26.0–34.0)
MCHC: 31.8 g/dL (ref 30.0–36.0)
MCV: 91.3 fL (ref 80.0–100.0)
Platelets: 191 10*3/uL (ref 150–400)
RBC: 4.24 MIL/uL (ref 3.87–5.11)
RDW: 13.1 % (ref 11.5–15.5)
WBC: 17 10*3/uL — ABNORMAL HIGH (ref 4.0–10.5)
nRBC: 0 % (ref 0.0–0.2)

## 2020-12-21 LAB — LACTIC ACID, PLASMA: Lactic Acid, Venous: 1.5 mmol/L (ref 0.5–1.9)

## 2020-12-21 LAB — MAGNESIUM: Magnesium: 1.6 mg/dL — ABNORMAL LOW (ref 1.7–2.4)

## 2020-12-21 LAB — HEMOGLOBIN A1C
Hgb A1c MFr Bld: 5.7 % — ABNORMAL HIGH (ref 4.8–5.6)
Mean Plasma Glucose: 117 mg/dL

## 2020-12-21 MED ORDER — SODIUM CHLORIDE 0.9 % IV BOLUS
500.0000 mL | Freq: Once | INTRAVENOUS | Status: AC
  Administered 2020-12-21: 500 mL via INTRAVENOUS

## 2020-12-21 MED ORDER — POTASSIUM CHLORIDE CRYS ER 20 MEQ PO TBCR
40.0000 meq | EXTENDED_RELEASE_TABLET | Freq: Once | ORAL | Status: AC
  Administered 2020-12-21: 40 meq via ORAL
  Filled 2020-12-21: qty 2

## 2020-12-21 MED ORDER — MAGNESIUM SULFATE 2 GM/50ML IV SOLN
2.0000 g | Freq: Once | INTRAVENOUS | Status: AC
  Administered 2020-12-21: 2 g via INTRAVENOUS
  Filled 2020-12-21: qty 50

## 2020-12-21 MED ORDER — SODIUM CHLORIDE 0.9 % IV SOLN: INTRAVENOUS | Status: DC

## 2020-12-21 MED ORDER — METOPROLOL TARTRATE 12.5 MG HALF TABLET
12.5000 mg | ORAL_TABLET | Freq: Two times a day (BID) | ORAL | Status: DC
  Administered 2020-12-21 – 2020-12-22 (×3): 12.5 mg via ORAL
  Filled 2020-12-21 (×3): qty 1

## 2020-12-21 NOTE — Plan of Care (Signed)
Problem: Clinical Measurements: Goal: Ability to maintain clinical measurements within normal limits will improve Outcome: Progressing Goal: Respiratory complications will improve Outcome: Progressing   Problem: Activity: Goal: Risk for activity intolerance will decrease Outcome: Progressing   Problem: Nutrition: Goal: Adequate nutrition will be maintained Outcome: Progressing   Problem: Coping: Goal: Level of anxiety will decrease Outcome: Progressing   Problem: Elimination: Goal: Will not experience complications related to urinary retention Outcome: Progressing   Problem: Pain Managment: Goal: General experience of comfort will improve Outcome: Progressing   Problem: Safety: Goal: Ability to remain free from injury will improve Outcome: Progressing   Problem: Skin Integrity: Goal: Risk for impaired skin integrity will decrease Outcome: Progressing   

## 2020-12-21 NOTE — TOC Initial Note (Signed)
Transition of Care [facility]) - Initial/Assessment Note    Patient Details  Name: Olivia Ross MRN: [id number] Date of Birth: 03-22-1946  Transition of Care [facility]) CM/SW Contact:    Pollie Friar, RN Phone Number: 12/21/2020, 1:12 PM  Clinical Narrative:                 Pt lives with her daughter that is with her most of the time. Daughter interested in a day program or home assistance during the hours she works from home. CM will reach out to [employer] per daughter request.  HH set up with [employer]. Information on the AVS.  Daughter denies transportation issues or home medication issues. Daughter has being over-seeing pts meds at home. TOC following.  Expected Discharge Plan: [place] Barriers to Discharge: Continued Medical Work up   Patient Goals and CMS Choice   CMS Medicare.gov Compare Post Acute Care list provided to:: Patient Represenative (must comment)    Expected Discharge Plan and Services Expected Discharge Plan: [place]   Discharge Planning Services: CM Consult Post Acute Care Choice: [place] arrangements for the past 2 months: Apartment                           HH Arranged: PT, OT HH Agency: [place] Date [place]: 12/21/20   Representative spoke with at [place]: Tommi Rumps  Prior Living Arrangements/Services Living arrangements for the past 2 months: Apartment Lives with:: Adult Children Patient language and need for interpreter reviewed:: Yes Do you feel safe going back to the place where you live?: Yes      Need for Family Participation in Patient Care: Yes (Comment) Care giver support system in place?: Yes (comment)   Criminal Activity/Legal Involvement Pertinent to Current Situation/Hospitalization: No - Comment as needed  Activities of Daily Living      Permission Sought/Granted                  Emotional Assessment Appearance:: Appears stated  age Attitude/Demeanor/Rapport: Engaged Affect (typically observed): Accepting Orientation: : Oriented to Self, Oriented to Place   Psych Involvement: No (comment)  Admission diagnosis:  Disorientation [R41.0] Acute cystitis without hematuria [N30.00] Acute encephalopathy [G93.40] Generalized weakness [R53.1] Encephalopathy [G93.40] Patient Active Problem List   Diagnosis Date Noted   E coli bacteremia 12/21/2020   Acute encephalopathy 12/20/2020   Encephalopathy 12/20/2020   Generalized weakness    Acute cystitis without hematuria    Disorientation    PCP:  Jolinda Croak, MD Pharmacy:   [facility] [phone] [address], [address] AT [facility] [address] [address] Phone: [phone] Fax: [phone]     Social Determinants of Health (SDOH) Interventions    Readmission Risk Interventions No flowsheet data found.

## 2020-12-21 NOTE — Evaluation (Signed)
Physical Therapy Evaluation Patient Details Name: Olivia Ross MRN: [id number] DOB: May 28, 1945 Today's Date: 12/21/2020  History of Present Illness  75 y.o. female presents to ED 12/19/20 with  acute mentation changes.  blood pressure fluctuated with systolic 211H-417E, blood work showed elevated lactate 2.3, WBC 17.  UA showed pyuria. 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 Pt reports she lives in apartment with "lots" of steps to enter. Lives with daughter who assists with bathing and dressing and all iADLs. Pt reports using RW to ambulate in her apartment. Pt is severely limited in safe mobility, by decreased command follow, sequencing and awareness of safety. Pt denies R hip pain however noted to have limp that increases with distance, during ambulation tele called noting SVT with HR 204. Pt has strength for mobility at minA level however requires up to maxA for initiation and RW management. PT recommending return to home environment given dementia with HHPT to work on mobility through repetition. PT will continue to follow acutely.    Orthostatic BPs                                                     BP                      HR Supine 152/56 64  Sitting 138/53 69  Standing 130/111 88  Standing after 3 min 144/66 113           Recommendations for follow up therapy are one component of a multi-disciplinary discharge planning process, led by the attending physician.  Recommendations may be updated based on patient status, additional functional criteria and insurance authorization.  Follow Up Recommendations Home health PT;Supervision/Assistance - 24 hour    Equipment Recommendations  None recommended by PT (has necessary equipment)    Recommendations for Other Services OT consult      Precautions / Restrictions Precautions Precautions: Fall Restrictions Weight Bearing Restrictions: No      Mobility  Bed Mobility Overal bed mobility: Needs Assistance Bed Mobility: Supine to Sit     Supine to sit: Min assist;HOB elevated     General bed mobility comments: maximal cuing for sequencing, had to physically remove grip from bedrail to be able to come to upright, with increased time and effort scoots to EoB    Transfers Overall transfer level: Needs assistance   Transfers: Sit to/from Stand Sit to Stand: Max assist         General transfer comment: pt counted to 3 but then did not start to standing, maxA to start powerup once initiated pt easily comes to standing  Ambulation/Gait Ambulation/Gait assistance: Min assist;Max assist Gait Distance (Feet): 20 Feet Assistive device: Rolling walker (2 wheeled) Gait Pattern/deviations: Step-to pattern;Decreased step length - left;Decreased stance time - right;Decreased weight shift to right;Shuffle;Trunk flexed Gait velocity: slowed Gait velocity interpretation: <1.31 ft/sec, indicative of household ambulator General Gait Details: first 10 feet light min A for steadying, once at door could not problem solve how to turm back to room, maximal A for management of RW increasing limp and flexed forward posture that could not be corrected, denies seated rest  break even with chair behind her would not let go of RW. continued back to recliner, with maximal effort for RW management and steadying support         Balance Overall balance assessment: Needs assistance Sitting-balance support: Feet supported;No upper extremity supported Sitting balance-Leahy Scale: Fair     Standing balance support: Bilateral upper extremity supported;During functional activity Standing balance-Leahy Scale: Poor Standing balance comment: requires UE support                             Pertinent Vitals/Pain Pain Assessment:  No/denies pain Faces Pain Scale: Hurts little more Pain Location: R hip with weightbearing Pain Descriptors / Indicators: Grimacing;Guarding;Discomfort Pain Intervention(s): Limited activity within patient's tolerance;Monitored during session;Repositioned    Home Living Family/patient expects to be discharged to:: Private residence Living Arrangements: Children Available Help at Discharge: Family;Available 24 hours/day Type of Home: Apartment Home Access: Stairs to enter   [employer] of Steps: lots Home Layout: One level Home Equipment: Walker - 2 wheels;Shower seat Additional Comments: Set up will need to be verified with daughter    Prior Function Level of Independence: Needs assistance   Gait / Transfers Assistance Needed: walks in apartment with RW  ADL's / Homemaking Assistance Needed: daughter assists with bathing and dressing, provides iADLs           Extremity/Trunk Assessment   Upper Extremity Assessment Upper Extremity Assessment: Generalized weakness    Lower Extremity Assessment Lower Extremity Assessment: Difficult to assess due to impaired cognition;RLE deficits/detail RLE Deficits / Details: R hip pain with weightbearing, determined by increasing limp, denies pain when asked       Communication   Communication: No difficulties  Cognition Arousal/Alertness: Awake/alert Behavior During Therapy: WFL for tasks assessed/performed Overall Cognitive Status: Impaired/Different from baseline Area of Impairment: Orientation;Attention;Memory;Following commands;Safety/judgement;Awareness;Problem solving                 Orientation Level: Disoriented to;Time Current Attention Level: Selective Memory: Decreased short-term memory Following Commands: Follows one step commands inconsistently;Follows one step commands with increased time Safety/Judgement: Decreased awareness of safety;Decreased awareness of deficits Awareness: Intellectual Problem  Solving: Slow processing;Decreased initiation;Difficulty sequencing;Requires verbal cues;Requires tactile cues General Comments: requires maximal multimodal cuing to perform tasks, forgets what she is doing while performing tasks      General Comments General comments (skin integrity, edema, etc.): During ambulation tele called with HR 204 bpm during ambulation,        Assessment/Plan    PT Assessment Patient needs continued PT services  PT Problem List Decreased strength;Decreased coordination;Decreased cognition;Decreased balance;Decreased range of motion;Decreased safety awareness;Decreased knowledge of use of DME       PT Treatment Interventions DME instruction;Gait training;Stair training;Functional mobility training;Therapeutic activities;Therapeutic exercise;Balance training;Cognitive remediation;Patient/family education    PT Goals (Current goals can be found in the Care Plan section)  Acute Rehab PT Goals Patient Stated Goal: get warm PT Goal Formulation: Patient unable to participate in goal setting Time For Goal Achievement: 01/04/21 Potential to Achieve Goals: Fair    Frequency Min 3X/week   Barriers to discharge Inaccessible home environment         AM-PAC PT "6 Clicks" Mobility  Outcome Measure Help needed turning from your back to your side while in a flat bed without using bedrails?: None Help needed moving from lying on your back to sitting on the side of a flat bed without using bedrails?: A Lot Help needed moving to  and from a bed to a chair (including a wheelchair)?: A Lot Help needed standing up from a chair using your arms (e.g., wheelchair or bedside chair)?: A Lot Help needed to walk in hospital room?: A Lot Help needed climbing 3-5 steps with a railing? : Total 6 Click Score: 13    End of Session Equipment Utilized During Treatment: Gait belt Activity Tolerance: Treatment limited secondary to medical complications (Comment) (increased HR) Patient  left: in chair;with call bell/phone within reach;with chair alarm set Nurse Communication: Mobility status;Other (comment) (vitals) PT Visit Diagnosis: Unsteadiness on feet (R26.81);Other abnormalities of gait and mobility (R26.89);Muscle weakness (generalized) (M62.81)    Time: 5188-4166 PT Time Calculation (min) (ACUTE ONLY): 29 min   Charges:   PT Evaluation $PT Eval Moderate Complexity: 1 Mod PT Treatments $Therapeutic Activity: 8-22 mins        Amalie Koran B. Migdalia Dk PT, DPT Acute Rehabilitation Services Pager [phone] Office [phone]   [place] 12/21/2020, 10:23 AM

## 2020-12-21 NOTE — Consult Note (Signed)
[facility]    Date of Admission:  12/19/2020     Reason for Consult:E coli bacteremia     Referring Physician: Dr Tyrell Antonio  Current antibiotics: Ceftriaxone  ASSESSMENT:    75 y.o. female admitted with:  E. coli bacteremia: Suspect secondary to urinary source and currently improved on ceftriaxone. Severe sepsis: Secondary to #1 and improved. Dementia Elevated glucose: Hemoglobin A1c pending HTN Hx of CVA Hx of breast cancer  RECOMMENDATIONS:    Continue ceftriaxone  Follow-up susceptibilities Glycemic control Will follow   Principal Problem:   E coli bacteremia Active Problems:   Acute encephalopathy   Encephalopathy   Generalized weakness   Acute cystitis without hematuria   Disorientation   MEDICATIONS:    Scheduled Meds:  donepezil  10 mg Oral QHS   enoxaparin (LOVENOX) injection  40 mg Subcutaneous Q24H   irbesartan  150 mg Oral Daily   letrozole  2.5 mg Oral Daily   metoprolol tartrate  12.5 mg Oral BID   multivitamin with minerals  1 tablet Oral Daily   Continuous Infusions:  sodium chloride 75 mL/hr at 12/21/20 0900   cefTRIAXone (ROCEPHIN)  IV 2 g (12/20/20 2300)   PRN Meds:.acetaminophen, dicyclomine, hydrALAZINE, polyvinyl alcohol  HPI:    Olivia Ross is a 75 y.o. female with a past medical history significant for dementia, hypertension, prior CVA, breast cancer on hormone therapy who presented yesterday with acute mental status changes.  Most of the history was obtained via chart review as patient does not recall the events leading up to her admission.  However, she does recall overall feeling badly and feeling dizzy.  She denies any abdominal pain, urinary symptoms, diarrhea, constipation, or chest pain.  When she presented to the ED her labs were concerning for infection as she had lactic acidosis of 2.3, leukocytosis of 17.  Her metabolic panel was reassuring with creatinine 1.05, normal electrolytes, normal  LFTs.  UA showed pyuria with many bacteria, negative nitrites.  Blood and urine cultures were obtained.  Urine culture has 80,000 colonies of E. coli and her blood cultures are positive for E. coli identified on BC ID.  She is currently on ceftriaxone and appears to be improved this morning.  She currently denies any complaints and is alert and oriented to person and place but not time which appears to be consistent with her baseline.  Patient was seen by gastroenterology on 10/27/2020 at [facility] for chronic diarrhea symptoms.  Due to issues with her blood pressure she was seen in the emergency department that day.  At that time her daughter was also concerned for possible UTI contributing to recent increase in confusion.  She was evaluated for a UTI that day but urinalysis was unremarkable and she was not prescribed antibiotics.  She subsequently saw her primary care provider on 11/02/2020 at which time concern for UTI remained and it was noted that her daughter did not trust the urine sample that was taken.  A repeat point-of-care urinalysis was done, however, those results are not available.  Antibiotics were not prescribed so presumably UA was bland.  The H&P from 9/21 states that patient received 7 days of oral antibiotics with improved confusion last month, however, review of patient's medication refill history shows no antibiotic refills since a course of cephalexin in April 2022 that we can find.    Past Medical History:  Diagnosis Date   Cancer [facility])    Hypertension  No family history on file.  Allergies  Allergen Reactions   Diphth-Acell Pertussis-Tetanus     Other reaction(s): Other (See Comments)   Dtap-Hepatitis B Recomb-Ipv Other (See Comments)    unknown   Norco [Hydrocodone-Acetaminophen] Nausea And Vomiting   Percocet [Oxycodone-Acetaminophen] Nausea And Vomiting    Review of Systems  Unable to perform ROS: Dementia   OBJECTIVE:   Blood pressure (!) 156/60, pulse 70,  temperature 97.6 F (36.4 C), temperature source Oral, resp. rate 18, height 5\' 5"  (1.651 m), weight 66.8 kg, SpO2 100 %. Body mass index is 24.51 kg/m.  Physical Exam Constitutional:      General: She is not in acute distress.    Appearance: Normal appearance.  HENT:     Head: Normocephalic and atraumatic.  Eyes:     General: No scleral icterus.    Extraocular Movements: Extraocular movements intact.     Conjunctiva/sclera: Conjunctivae normal.  Cardiovascular:     Rate and Rhythm: Normal rate and regular rhythm.  Pulmonary:     Effort: Pulmonary effort is normal. No respiratory distress.     Breath sounds: Normal breath sounds.  Abdominal:     General: There is no distension.     Palpations: Abdomen is soft.     Tenderness: There is no abdominal tenderness. There is no guarding or rebound.  Skin:    General: Skin is warm and dry.     Findings: No rash.  Neurological:     General: No focal deficit present.     Mental Status: She is alert. Mental status is at baseline.     Cranial Nerves: No cranial nerve deficit.     Comments: Oriented to person and place.  Not time.   Psychiatric:        Mood and Affect: Mood normal.        Behavior: Behavior normal.     Lab Results: Lab Results  Component Value Date   WBC 17.0 (H) 12/21/2020   HGB 12.3 12/21/2020   HCT 38.7 12/21/2020   MCV 91.3 12/21/2020   PLT 191 12/21/2020    Lab Results  Component Value Date   NA 135 12/21/2020   K 3.5 12/21/2020   CO2 20 (L) 12/21/2020   GLUCOSE 73 12/21/2020   BUN 7 (L) 12/21/2020   CREATININE 0.73 12/21/2020   CALCIUM 9.3 12/21/2020   GFRNONAA >60 12/21/2020    Lab Results  Component Value Date   ALT 31 12/19/2020   AST 39 12/19/2020   ALKPHOS 93 12/19/2020   BILITOT 0.8 12/19/2020    No results found for: CRP  No results found for: ESRSEDRATE  I have reviewed the micro and lab results in Epic.  Imaging: CT HEAD WO CONTRAST (5MM)  Result Date: 12/20/2020 CLINICAL  DATA:  Altered mental status. EXAM: CT HEAD WITHOUT CONTRAST TECHNIQUE: Contiguous axial images were obtained from the base of the skull through the vertex without intravenous contrast. COMPARISON:  Head CT dated 07/21/2020. FINDINGS: Brain: Mild age-related atrophy and chronic microvascular ischemic changes. 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 Small old left frontal infarcts. Small old cerebellar infarcts noted. There is no acute intracranial hemorrhage. No mass effect or midline shift no extra-axial fluid collection. Vascular: No hyperdense vessel or unexpected calcification. Skull: Normal. Negative for fracture or focal lesion. Sinuses/Orbits: No acute finding. Other: None IMPRESSION: 1. No acute intracranial pathology. 2. Age-related atrophy and chronic microvascular ischemic changes. Large area of old infarct and  encephalomalacia involving the right MCA territory. 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 Electronically Signed   By: Anner Crete M.D.   On: 12/20/2020 00:10   DG Pelvis Portable  Result Date: 12/20/2020 CLINICAL DATA:  Pain with ambulation.  Weakness and confusion. EXAM: PORTABLE PELVIS 1-2 VIEWS COMPARISON:  None. FINDINGS: Patient is rotated. The bones are diffusely under mineralized. No fracture. Mild bilateral hip joint space narrowing. No evidence of a vascular necrosis or focal bone lesion. Pubic symphysis and sacroiliac joints are congruent. Calcifications projecting superolateral to the left iliac crest likely represent gluteal granuloma. IMPRESSION: Osteopenia/osteoporosis. Mild bilateral hip joint space narrowing. No acute findings. Electronically Signed   By: Keith Rake M.D.   On: 12/20/2020 00:03   DG Chest Portable 1 View  Result Date: 12/20/2020 CLINICAL DATA:  Altered mental status EXAM: PORTABLE CHEST 1 VIEW COMPARISON:  07/21/2020 FINDINGS: The heart size and mediastinal contours are within normal limits.  Aortic atherosclerosis. Both lungs are clear. No pleural effusion or pneumothorax. No acute osseous abnormality. Surgical clips overlie the right chest wall. IMPRESSION: No active disease. Aortic Atherosclerosis (ICD10-I70.0). Electronically Signed   By: Merilyn Baba M.D.   On: 12/20/2020 00:02     Imaging independently reviewed in Epic.  [facility] [phone] pager 12/21/2020, 12:08 PM

## 2020-12-21 NOTE — Progress Notes (Signed)
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  Patient been having issues with UTI due to GI problems, inability of patient's to keep herself clean from diarrhea.  She was treated for UTI recently with 7 days of oral antibiotics.  Patient admitted with leukocytosis, altered mental status, UA with pyuria.  Patient found to have E. coli bacteremia.  Assessment & Plan:   Principal Problem:   E coli bacteremia Active Problems:   Acute encephalopathy   Encephalopathy   Generalized weakness   Acute cystitis without hematuria   Disorientation  1-Acute metabolic encephalopathy: Also underlying dementia: Altered mental status, patient was more lethargic and weak and more confused at home.  Likely related to UTI and bacteremia. Treating for infectious process. Discussed with daughter, also dementia playing a role in her cognition. CT head: no acute intracranial pathology.  Check B 12.   2-Sepsis secondary to urinary tract infection and E coli  bacteremia Patient presents with tachycardia, altered mental status, leukocytosis, lactic acidosis, source of infection UTI. Received  if IV fluids. Blood cultures growing E. coli. Continue with IV ceftriaxone. ID following in consultation.  Lactic acid peak 3.2 WBC at 17.   3-Dementia: Continue with Aricept  Hypertension: Holding amlodipine  On Irbersartan.   SVT, Hypomagnesemia; started low dose metoprolol.  IV magnesium ordered. Times 2.  Repeat mg level tomorrow.  Received oral potassium supplement   Deconditioning: PT eval recommending home health Hyperglycemia: 1C pending  History of Bilateral breast cancer: Status post bilateral mastectomy 2021.  Continue with  hormone therapy.  Estimated body mass index is 24.51 kg/m as calculated from the following:   Height as of this encounter: 5\' 5"  (1.651 m).   Weight as of this encounter: 66.8 kg.   DVT prophylaxis: Lovenox Code Status: Full code Family Communication: Care discussed with daughter who was at bedside Disposition Plan:  Status is: Observation  The patient remains OBS appropriate and will d/c before 2 midnights.  Dispo: The patient is from: Home              Anticipated d/c is to: Home              Patient currently is not medically stable to d/c.   Difficult to place patient No        Consultants:  ID  Procedures:  None  Antimicrobials:    Subjective: She is alert oriented to person, denies pain. She doesn't know why she is in the hospital.    Objective: Vitals:   12/20/20 2346 12/21/20 0346 12/21/20 0900 12/21/20 1230  BP: (!) 141/57 (!) 177/57 (!) 156/60 (!) 142/74  Pulse: 68 (!) 58 70 (!) 57  Resp: 16 18    Temp: 98.3 F (36.8 C) 98.3 F (36.8 C) 97.6 F (36.4 C) 97.9 F (36.6 C)  TempSrc: Oral Oral Oral Oral  SpO2: 100% 100% 100% 100%  Weight:      Height:        Intake/Output Summary (Last 24 hours) at 12/21/2020 1451 Last data filed at 12/21/2020 0700 Gross per 24 hour  Intake 740 ml  Output 1000 ml  Net -260 ml   Filed Weights   12/19/20 2155  Weight: 66.8 kg    Examination:  General exam: Appears calm  and comfortable  Respiratory system: Clear to auscultation. Respiratory effort normal. Cardiovascular system: S1 & S2 heard, RRR. No JVD, murmurs, rubs, gallops or clicks. No pedal edema. Gastrointestinal system: Abdomen is nondistended, soft and nontender. No organomegaly or masses felt. Normal bowel sounds heard. Central nervous system: Alert No focal neurological deficits. Extremities: Symmetric 5 x 5 power.   Data Reviewed: I have personally reviewed following labs and imaging studies  CBC: Recent Labs  Lab 12/19/20 2227  12/21/20 0330  WBC 17.8* 17.0*  NEUTROABS 15.9*  --   HGB 13.3 12.3  HCT 39.8 38.7  MCV 89.6 91.3  PLT 205 725   Basic Metabolic Panel: Recent Labs  Lab 12/19/20 2227 12/21/20 0330  NA 135 135  K 3.7 3.5  CL 105 105  CO2 23 20*  GLUCOSE 184* 73  BUN 15 7*  CREATININE 1.05* 0.73  CALCIUM 9.4 9.3  MG  --  1.6*   GFR: Estimated Creatinine Clearance: 53 mL/min (by C-G formula based on SCr of 0.73 mg/dL). Liver Function Tests: Recent Labs  Lab 12/19/20 2227  AST 39  ALT 31  ALKPHOS 93  BILITOT 0.8  PROT 6.6  ALBUMIN 3.2*   No results for input(s): LIPASE, AMYLASE in the last 168 hours. No results for input(s): AMMONIA in the last 168 hours. Coagulation Profile: No results for input(s): INR, PROTIME in the last 168 hours. Cardiac Enzymes: No results for input(s): CKTOTAL, CKMB, CKMBINDEX, TROPONINI in the last 168 hours. BNP (last 3 results) No results for input(s): PROBNP in the last 8760 hours. HbA1C: No results for input(s): HGBA1C in the last 72 hours. CBG: No results for input(s): GLUCAP in the last 168 hours. Lipid Profile: No results for input(s): CHOL, HDL, LDLCALC, TRIG, CHOLHDL, LDLDIRECT in the last 72 hours. Thyroid Function Tests: No results for input(s): TSH, T4TOTAL, FREET4, T3FREE, THYROIDAB in the last 72 hours. Anemia Panel: No results for input(s): VITAMINB12, FOLATE, FERRITIN, TIBC, IRON, RETICCTPCT in the last 72 hours. Sepsis Labs: Recent Labs  Lab 12/20/20 0450 12/20/20 0844 12/20/20 1819 12/21/20 0330  LATICACIDVEN 3.2* 0.8 2.3* 1.5    Recent Results (from the past 240 hour(s))  Resp Panel by RT-PCR (Flu A&B, Covid) Nasopharyngeal Swab     Status: None   Collection Time: 12/19/20 11:37 PM   Specimen: Nasopharyngeal Swab; Nasopharyngeal(NP) swabs in vial transport medium  Result Value Ref Range Status   SARS Coronavirus 2 by RT PCR NEGATIVE NEGATIVE Final    Comment: (NOTE) SARS-CoV-2 target nucleic acids are NOT  DETECTED.  The SARS-CoV-2 RNA is generally detectable in upper respiratory specimens during the acute phase of infection. The lowest concentration of SARS-CoV-2 viral copies this assay can detect is 138 copies/mL. A negative result does not preclude SARS-Cov-2 infection and should not be used as the sole basis for treatment or other patient management decisions. A negative result may occur with  improper specimen collection/handling, submission of specimen other than nasopharyngeal swab, presence of viral mutation(s) within the areas targeted by this assay, and inadequate number of viral copies(<138 copies/mL). A negative result must be combined with clinical observations, patient history, and epidemiological information. The expected result is Negative.  Fact Sheet for Patients:  EntrepreneurPulse.com.au  Fact Sheet for Healthcare Providers:  IncredibleEmployment.be  This test is no t yet approved or cleared by the [place] FDA and  has been authorized for detection and/or diagnosis of SARS-CoV-2 by FDA under an Emergency Use Authorization (EUA). This EUA will remain  in  effect (meaning this test can be used) for the duration of the COVID-19 declaration under Section 564(b)(1) of the Act, 21 U.S.C.section 360bbb-3(b)(1), unless the authorization is terminated  or revoked sooner.       Influenza A by PCR NEGATIVE NEGATIVE Final   Influenza B by PCR NEGATIVE NEGATIVE Final    Comment: (NOTE) The Xpert Xpress SARS-CoV-2/FLU/RSV plus assay is intended as an aid in the diagnosis of influenza from Nasopharyngeal swab specimens and should not be used as a sole basis for treatment. Nasal washings and aspirates are unacceptable for Xpert Xpress SARS-CoV-2/FLU/RSV testing.  Fact Sheet for Patients: EntrepreneurPulse.com.au  Fact Sheet for Healthcare Providers: IncredibleEmployment.be  This test is not yet  approved or cleared by the [place] FDA and has been authorized for detection and/or diagnosis of SARS-CoV-2 by FDA under an Emergency Use Authorization (EUA). This EUA will remain in effect (meaning this test can be used) for the duration of the COVID-19 declaration under Section 564(b)(1) of the Act, 21 U.S.C. section 360bbb-3(b)(1), unless the authorization is terminated or revoked.  Performed at [facility], [address] [place], [place] [address]   Urine Culture     Status: Abnormal (Preliminary result)   Collection Time: 12/20/20  1:25 AM   Specimen: Urine, Clean Catch  Result Value Ref Range Status   Specimen Description   Final    URINE, CLEAN CATCH Performed at [facility], [address] [place], [place] [address]    Special Requests   Final    NONE Performed at [facility], [address] [place], [place] [address]    Culture (A)  Final    80,000 COLONIES/mL ESCHERICHIA COLI SUSCEPTIBILITIES TO FOLLOW Performed at [facility], [place] [address], [place], [place] [address]    Report Status PENDING  Incomplete  Culture, blood (routine x 2)     Status: Abnormal (Preliminary result)   Collection Time: 12/20/20  3:00 AM   Specimen: BLOOD RIGHT FOREARM  Result Value Ref Range Status   Specimen Description   Final    BLOOD RIGHT FOREARM Performed at [facility], [address] [place], [place] [address]    Special Requests   Final    BOTTLES DRAWN AEROBIC AND ANAEROBIC Blood Culture adequate volume Performed at [facility], [address] [place], [place] [address]    Culture  Setup Time   Final    GRAM NEGATIVE RODS ANAEROBIC BOTTLE ONLY IN BOTH AEROBIC AND ANAEROBIC BOTTLES CRITICAL RESULT CALLED TO, READ BACK BY AND VERIFIED WITH: PHARMD JAMES LEDFORD 12/20/20@21 :59 BY TW    Culture (A)  Final    ESCHERICHIA COLI SUSCEPTIBILITIES TO FOLLOW Performed at [facility], [place]  [address], [place],  [address]    Report Status PENDING  Incomplete  Blood Culture ID Panel (Reflexed)     Status: Abnormal   Collection Time: 12/20/20  3:00 AM  Result Value Ref Range Status   Enterococcus faecalis NOT DETECTED NOT DETECTED Final   Enterococcus Faecium NOT DETECTED NOT DETECTED Final   Listeria monocytogenes NOT DETECTED NOT DETECTED Final   Staphylococcus species NOT DETECTED NOT DETECTED Final   Staphylococcus aureus (BCID) NOT DETECTED NOT DETECTED Final   Staphylococcus epidermidis NOT DETECTED NOT DETECTED Final   Staphylococcus lugdunensis NOT DETECTED NOT DETECTED Final   Streptococcus species NOT DETECTED NOT DETECTED Final   Streptococcus agalactiae NOT DETECTED NOT  DETECTED Final   Streptococcus pneumoniae NOT DETECTED NOT DETECTED Final   Streptococcus pyogenes NOT DETECTED NOT DETECTED Final   A.calcoaceticus-baumannii NOT DETECTED NOT DETECTED Final   Bacteroides fragilis NOT DETECTED NOT DETECTED Final   Enterobacterales DETECTED (A) NOT DETECTED Final    Comment: Enterobacterales represent a large order of gram negative bacteria, not a single organism. CRITICAL RESULT CALLED TO, READ BACK BY AND VERIFIED WITH: PHARMD JAMES LEDFORD 12/20/20@21 :59 BY TW    Enterobacter cloacae complex NOT DETECTED NOT DETECTED Final   Escherichia coli DETECTED (A) NOT DETECTED Final    Comment: CRITICAL RESULT CALLED TO, READ BACK BY AND VERIFIED WITH: PHARMD JAMES LEDFORD 12/20/20@21 :59 BY TW    Klebsiella aerogenes NOT DETECTED NOT DETECTED Final   Klebsiella oxytoca NOT DETECTED NOT DETECTED Final   Klebsiella pneumoniae NOT DETECTED NOT DETECTED Final   Proteus species NOT DETECTED NOT DETECTED Final   Salmonella species NOT DETECTED NOT DETECTED Final   Serratia marcescens NOT DETECTED NOT DETECTED Final   Haemophilus influenzae NOT DETECTED NOT DETECTED Final   Neisseria meningitidis NOT DETECTED NOT DETECTED Final   Pseudomonas aeruginosa NOT DETECTED NOT  DETECTED Final   Stenotrophomonas maltophilia NOT DETECTED NOT DETECTED Final   Candida albicans NOT DETECTED NOT DETECTED Final   Candida auris NOT DETECTED NOT DETECTED Final   Candida glabrata NOT DETECTED NOT DETECTED Final   Candida krusei NOT DETECTED NOT DETECTED Final   Candida parapsilosis NOT DETECTED NOT DETECTED Final   Candida tropicalis NOT DETECTED NOT DETECTED Final   Cryptococcus neoformans/gattii NOT DETECTED NOT DETECTED Final   CTX-M ESBL NOT DETECTED NOT DETECTED Final   Carbapenem resistance IMP NOT DETECTED NOT DETECTED Final   Carbapenem resistance KPC NOT DETECTED NOT DETECTED Final   Carbapenem resistance NDM NOT DETECTED NOT DETECTED Final   Carbapenem resist OXA 48 LIKE NOT DETECTED NOT DETECTED Final   Carbapenem resistance VIM NOT DETECTED NOT DETECTED Final    Comment: Performed at [facility], [place] [address], [place], [place] [address]  Culture, blood (routine x 2)     Status: None (Preliminary result)   Collection Time: 12/20/20  6:19 PM   Specimen: BLOOD  Result Value Ref Range Status   Specimen Description BLOOD RIGHT ANTECUBITAL  Final   Special Requests AEROBIC BOTTLE ONLY Blood Culture adequate volume  Final   Culture   Final    NO GROWTH < 24 HOURS Performed at [facility], [place] [address], [place], [place] [address]    Report Status PENDING  Incomplete         Radiology Studies: CT HEAD WO CONTRAST (5MM)  Result Date: 12/20/2020 CLINICAL DATA:  Altered mental status. EXAM: CT HEAD WITHOUT CONTRAST TECHNIQUE: Contiguous axial images were obtained from the base of the skull through the vertex without intravenous contrast. COMPARISON:  Head CT dated 07/21/2020. FINDINGS: Brain: Mild age-related atrophy and chronic microvascular ischemic changes. 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 Small old left frontal infarcts. Small old cerebellar infarcts noted. There is no acute intracranial  hemorrhage. No mass effect or midline shift no extra-axial fluid collection. Vascular: No hyperdense vessel or unexpected calcification. Skull: Normal. Negative for fracture or focal lesion. Sinuses/Orbits: No acute finding. Other: None IMPRESSION: 1. No acute intracranial pathology. 2. Age-related atrophy and chronic microvascular ischemic changes. 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 Electronically Signed  By: Anner Crete M.D.   On: 12/20/2020 00:10   DG Pelvis Portable  Result Date: 12/20/2020 CLINICAL DATA:  Pain with ambulation.  Weakness and confusion. EXAM: PORTABLE PELVIS 1-2 VIEWS COMPARISON:  None. FINDINGS: Patient is rotated. The bones are diffusely under mineralized. No fracture. Mild bilateral hip joint space narrowing. No evidence of a vascular necrosis or focal bone lesion. Pubic symphysis and sacroiliac joints are congruent. Calcifications projecting superolateral to the left iliac crest likely represent gluteal granuloma. IMPRESSION: Osteopenia/osteoporosis. Mild bilateral hip joint space narrowing. No acute findings. Electronically Signed   By: Keith Rake M.D.   On: 12/20/2020 00:03   DG Chest Portable 1 View  Result Date: 12/20/2020 CLINICAL DATA:  Altered mental status EXAM: PORTABLE CHEST 1 VIEW COMPARISON:  07/21/2020 FINDINGS: The heart size and mediastinal contours are within normal limits. Aortic atherosclerosis. Both lungs are clear. No pleural effusion or pneumothorax. No acute osseous abnormality. Surgical clips overlie the right chest wall. IMPRESSION: No active disease. Aortic Atherosclerosis (ICD10-I70.0). Electronically Signed   By: Merilyn Baba M.D.   On: 12/20/2020 00:02        Scheduled Meds:  donepezil  10 mg Oral QHS   enoxaparin (LOVENOX) injection  40 mg Subcutaneous Q24H   irbesartan  150 mg Oral Daily   letrozole  2.5 mg Oral Daily   metoprolol  tartrate  12.5 mg Oral BID   multivitamin with minerals  1 tablet Oral Daily   Continuous Infusions:  sodium chloride 75 mL/hr at 12/21/20 0900   cefTRIAXone (ROCEPHIN)  IV 2 g (12/20/20 2300)     LOS: 0 days    Time spent: 35 minutes.     Elmarie Shiley, MD [facility]   If 7PM-7AM, please contact night-coverage [email or web address]  12/21/2020, 2:51 PM

## 2020-12-21 NOTE — Plan of Care (Signed)
Pt is alert oriented x 2. Q12 orthostatic VS per order. Pt was able to sit up on side of bed and stand up with assistance but unsteady. Purewick in place. Pt has several beats of SVT, asymptomatic. No distress. Pt had elevated lactic level, provider informed, bolus given then scheduled IV fluids started.    Problem: Education: Goal: Knowledge of General Education information will improve Description: Including pain rating scale, medication(s)/side effects and non-pharmacologic comfort measures Outcome: Progressing   Problem: Health Behavior/Discharge Planning: Goal: Ability to manage health-related needs will improve Outcome: Progressing   Problem: Clinical Measurements: Goal: Ability to maintain clinical measurements within normal limits will improve Outcome: Progressing Goal: Will remain free from infection Outcome: Progressing Goal: Diagnostic test results will improve Outcome: Progressing Goal: Respiratory complications will improve Outcome: Progressing Goal: Cardiovascular complication will be avoided Outcome: Progressing   Problem: Activity: Goal: Risk for activity intolerance will decrease Outcome: Progressing   Problem: Nutrition: Goal: Adequate nutrition will be maintained Outcome: Progressing   Problem: Coping: Goal: Level of anxiety will decrease Outcome: Progressing   Problem: Elimination: Goal: Will not experience complications related to bowel motility Outcome: Progressing Goal: Will not experience complications related to urinary retention Outcome: Progressing   Problem: Pain Managment: Goal: General experience of comfort will improve Outcome: Progressing   Problem: Safety: Goal: Ability to remain free from injury will improve Outcome: Progressing   Problem: Skin Integrity: Goal: Risk for impaired skin integrity will decrease Outcome: Progressing

## 2020-12-22 DIAGNOSIS — B9620 Unspecified Escherichia coli [E. coli] as the cause of diseases classified elsewhere: Secondary | ICD-10-CM | POA: Diagnosis not present

## 2020-12-22 DIAGNOSIS — A4151 Sepsis due to Escherichia coli [E. coli]: Secondary | ICD-10-CM | POA: Diagnosis not present

## 2020-12-22 DIAGNOSIS — N3000 Acute cystitis without hematuria: Secondary | ICD-10-CM | POA: Diagnosis not present

## 2020-12-22 DIAGNOSIS — R7881 Bacteremia: Secondary | ICD-10-CM | POA: Diagnosis not present

## 2020-12-22 DIAGNOSIS — G9341 Metabolic encephalopathy: Secondary | ICD-10-CM

## 2020-12-22 LAB — URINE CULTURE: Culture: 80000 — AB

## 2020-12-22 LAB — CBC
HCT: 35.7 % — ABNORMAL LOW (ref 36.0–46.0)
Hemoglobin: 11.8 g/dL — ABNORMAL LOW (ref 12.0–15.0)
MCH: 29.5 pg (ref 26.0–34.0)
MCHC: 33.1 g/dL (ref 30.0–36.0)
MCV: 89.3 fL (ref 80.0–100.0)
Platelets: 212 10*3/uL (ref 150–400)
RBC: 4 MIL/uL (ref 3.87–5.11)
RDW: 12.8 % (ref 11.5–15.5)
WBC: 10.7 10*3/uL — ABNORMAL HIGH (ref 4.0–10.5)
nRBC: 0 % (ref 0.0–0.2)

## 2020-12-22 LAB — BASIC METABOLIC PANEL
Anion gap: 9 (ref 5–15)
BUN: 6 mg/dL — ABNORMAL LOW (ref 8–23)
CO2: 21 mmol/L — ABNORMAL LOW (ref 22–32)
Calcium: 8.9 mg/dL (ref 8.9–10.3)
Chloride: 105 mmol/L (ref 98–111)
Creatinine, Ser: 0.64 mg/dL (ref 0.44–1.00)
GFR, Estimated: >60 mL/min (ref >60)
Glucose, Bld: 75 mg/dL (ref 70–99)
Potassium: 3.8 mmol/L (ref 3.5–5.1)
Sodium: 135 mmol/L (ref 135–145)

## 2020-12-22 LAB — MAGNESIUM: Magnesium: 1.9 mg/dL (ref 1.7–2.4)

## 2020-12-22 LAB — VITAMIN B12: Vitamin B-12: 383 pg/mL (ref 180–914)

## 2020-12-22 MED ORDER — MAGNESIUM OXIDE -MG SUPPLEMENT 400 (240 MG) MG PO TABS
200.0000 mg | ORAL_TABLET | Freq: Two times a day (BID) | ORAL | Status: DC
  Administered 2020-12-22: 200 mg via ORAL
  Filled 2020-12-22: qty 1

## 2020-12-22 MED ORDER — METOPROLOL TARTRATE 25 MG PO TABS: 12.5000 mg | ORAL_TABLET | Freq: Two times a day (BID) | ORAL | 3 refills | Status: DC

## 2020-12-22 MED ORDER — AMOXICILLIN 500 MG PO CAPS: 500.0000 mg | ORAL_CAPSULE | Freq: Three times a day (TID) | ORAL | Status: DC

## 2020-12-22 MED ORDER — CYANOCOBALAMIN 100 MCG PO TABS: 100.0000 ug | ORAL_TABLET | Freq: Every day | ORAL | 0 refills | Status: AC

## 2020-12-22 MED ORDER — AMOXICILLIN 500 MG PO CAPS: 500.0000 mg | ORAL_CAPSULE | Freq: Three times a day (TID) | ORAL | 0 refills | Status: AC

## 2020-12-22 MED ORDER — VITAMIN B-12 100 MCG PO TABS
100.0000 ug | ORAL_TABLET | Freq: Every day | ORAL | Status: DC
  Administered 2020-12-22: 100 ug via ORAL
  Filled 2020-12-22: qty 1

## 2020-12-22 MED ORDER — MAGNESIUM OXIDE -MG SUPPLEMENT 400 (240 MG) MG PO TABS: 200.0000 mg | ORAL_TABLET | Freq: Two times a day (BID) | ORAL | 0 refills | Status: DC

## 2020-12-22 NOTE — Progress Notes (Signed)
[facility]  Date of Admission:  12/19/2020           Reason for visit: Follow up on E coli bacteremia  Current antibiotics: Ceftriaxone 9/20- present  ASSESSMENT:    75 y.o. female admitted with:  Severe sepsis secondary to E. coli bacteremia from a suspected urinary tract infection.  She has clinically improved on ceftriaxone and susceptibilities from blood and urine cultures have grown a pansensitive isolate. Dementia  RECOMMENDATIONS:    Will narrow antibiotics based on susceptibilities and transition to oral therapy given her rapid clinical improvement with amoxicillin 500 mg 3 times daily. Plan for 7 days total antibiotics with end date 12/27/2020. Will sign off, please call as needed.     Principal Problem:   E coli bacteremia Active Problems:   Acute encephalopathy   Encephalopathy   Generalized weakness   Acute cystitis without hematuria   Disorientation    MEDICATIONS:    Scheduled Meds:  amoxicillin  500 mg Oral Q8H   donepezil  10 mg Oral QHS   enoxaparin (LOVENOX) injection  40 mg Subcutaneous Q24H   irbesartan  150 mg Oral Daily   letrozole  2.5 mg Oral Daily   magnesium oxide  200 mg Oral BID   metoprolol tartrate  12.5 mg Oral BID   multivitamin with minerals  1 tablet Oral Daily   vitamin B-12  100 mcg Oral Daily   Continuous Infusions:  sodium chloride 75 mL/hr at 12/21/20 1804   PRN Meds:.acetaminophen, dicyclomine, hydrALAZINE, polyvinyl alcohol  SUBJECTIVE:   24 hour events:  No acute events noted Afebrile, T-max 98.9 Blood pressure elevated No new imaging Stable metabolic panel, magnesium 1.9 CBC with improved WBC E. coli pansensitive  No new complaints.  Denies fevers or abdominal pain.  No dysuria.  She states that she feels good.  Review of Systems  All other systems reviewed and are negative.    OBJECTIVE:   Blood pressure (!) 155/50, pulse 97, temperature 98 F (36.7 C), temperature  source Oral, resp. rate 16, height 5\' 5"  (1.651 m), weight 66.8 kg, SpO2 100 %. Body mass index is 24.51 kg/m.  Physical Exam Constitutional:      General: She is not in acute distress.    Appearance: Normal appearance.  HENT:     Head: Normocephalic and atraumatic.  Eyes:     Extraocular Movements: Extraocular movements intact.     Conjunctiva/sclera: Conjunctivae normal.  Pulmonary:     Effort: Pulmonary effort is normal. No respiratory distress.  Skin:    General: Skin is warm and dry.     Findings: No rash.  Neurological:     General: No focal deficit present.     Mental Status: She is alert. Mental status is at baseline.     Comments: Alert and oriented to person and place.  She knows she is at [facility] for a urinary infection.  She is not oriented to time.  Psychiatric:        Mood and Affect: Mood normal.        Behavior: Behavior normal.     Lab Results: Lab Results  Component Value Date   WBC 10.7 (H) 12/22/2020   HGB 11.8 (L) 12/22/2020   HCT 35.7 (L) 12/22/2020   MCV 89.3 12/22/2020   PLT 212 12/22/2020    Lab Results  Component Value Date   NA 135 12/22/2020   K 3.8 12/22/2020   CO2 21 (  L) 12/22/2020   GLUCOSE 75 12/22/2020   BUN 6 (L) 12/22/2020   CREATININE 0.64 12/22/2020   CALCIUM 8.9 12/22/2020   GFRNONAA >60 12/22/2020    Lab Results  Component Value Date   ALT 31 12/19/2020   AST 39 12/19/2020   ALKPHOS 93 12/19/2020   BILITOT 0.8 12/19/2020    No results found for: CRP  No results found for: ESRSEDRATE   I have reviewed the micro and lab results in Epic.  Imaging: No results found.   Imaging independently reviewed in Epic.    Raynelle Highland for Infectious Disease [facility] [phone] pager 12/22/2020, 10:30 AM  I spent greater than 35 minutes with the patient including greater than 50% of time in face to face counsel of the patient and in coordination of their care.

## 2020-12-22 NOTE — TOC Transition Note (Signed)
Transition of Care [facility]) - CM/SW Discharge Note   Patient Details  Name: Makensie Mulhall MRN: [id number] Date of Birth: 10/21/1945  Transition of Care [facility]) CM/SW Contact:  Pollie Friar, RN Phone Number: 12/22/2020, 12:36 PM   Clinical Narrative:    Patient is discharging home with [facility] services through [facility]. Information on the AVS. No new DME needs.  CM has spoken to the director of [facility] and they are interested in having pt attend the day program again. CM will provide information to patients daughter at d/c.  Daughter will provide transport home.   Final next level of care: Home w Home Health Services Barriers to Discharge: No Barriers Identified   Patient Goals and CMS Choice   CMS Medicare.gov Compare Post Acute Care list provided to:: Patient Represenative (must comment)    Discharge Placement                       Discharge Plan and Services   Discharge Planning Services: CM Consult Post Acute Care Choice: Home Health                    HH Arranged: PT, OT [facility] Agency: [place] Date [facility] Agency Contacted: 12/21/20   Representative spoke with at [place]: [place] ([place]) Interventions     Readmission Risk Interventions No flowsheet data found.

## 2020-12-22 NOTE — Plan of Care (Signed)
Pt is alert oriented x 3, pt continues IV antibiotics. IV fluids. Pt has purewick in place.   Problem: Education: Goal: Knowledge of General Education information will improve Description: Including pain rating scale, medication(s)/side effects and non-pharmacologic comfort measures 12/22/2020 0818 by Allayne Stack, RN Outcome: Progressing 12/22/2020 0437 by Allayne Stack, RN Outcome: Progressing   Problem: Health Behavior/Discharge Planning: Goal: Ability to manage health-related needs will improve 12/22/2020 0818 by Allayne Stack, RN Outcome: Progressing 12/22/2020 0437 by Allayne Stack, RN Outcome: Progressing   Problem: Clinical Measurements: Goal: Ability to maintain clinical measurements within normal limits will improve 12/22/2020 0818 by Allayne Stack, RN Outcome: Progressing 12/22/2020 0437 by Allayne Stack, RN Outcome: Progressing Goal: Will remain free from infection 12/22/2020 0818 by Allayne Stack, RN Outcome: Progressing 12/22/2020 0437 by Allayne Stack, RN Outcome: Progressing Goal: Diagnostic test results will improve 12/22/2020 0818 by Allayne Stack, RN Outcome: Progressing 12/22/2020 0437 by Allayne Stack, RN Outcome: Progressing Goal: Respiratory complications will improve 12/22/2020 0818 by Allayne Stack, RN Outcome: Progressing 12/22/2020 0437 by Allayne Stack, RN Outcome: Progressing Goal: Cardiovascular complication will be avoided 12/22/2020 0818 by Allayne Stack, RN Outcome: Progressing 12/22/2020 0437 by Allayne Stack, RN Outcome: Progressing   Problem: Activity: Goal: Risk for activity intolerance will decrease 12/22/2020 0818 by Allayne Stack, RN Outcome: Progressing 12/22/2020 0437 by Allayne Stack, RN Outcome: Progressing   Problem: Nutrition: Goal: Adequate nutrition will be maintained 12/22/2020 0818 by Allayne Stack, RN Outcome: Progressing 12/22/2020 0437 by  Allayne Stack, RN Outcome: Progressing   Problem: Coping: Goal: Level of anxiety will decrease 12/22/2020 0818 by Allayne Stack, RN Outcome: Progressing 12/22/2020 0437 by Allayne Stack, RN Outcome: Progressing   Problem: Elimination: Goal: Will not experience complications related to bowel motility 12/22/2020 0818 by Allayne Stack, RN Outcome: Progressing 12/22/2020 0437 by Allayne Stack, RN Outcome: Progressing Goal: Will not experience complications related to urinary retention 12/22/2020 0818 by Allayne Stack, RN Outcome: Progressing 12/22/2020 0437 by Allayne Stack, RN Outcome: Progressing   Problem: Pain Managment: Goal: General experience of comfort will improve 12/22/2020 0818 by Allayne Stack, RN Outcome: Progressing 12/22/2020 0437 by Allayne Stack, RN Outcome: Progressing   Problem: Safety: Goal: Ability to remain free from injury will improve 12/22/2020 0818 by Allayne Stack, RN Outcome: Progressing 12/22/2020 0437 by Allayne Stack, RN Outcome: Progressing   Problem: Skin Integrity: Goal: Risk for impaired skin integrity will decrease 12/22/2020 0818 by Allayne Stack, RN Outcome: Progressing 12/22/2020 0437 by Allayne Stack, RN Outcome: Progressing

## 2020-12-22 NOTE — Progress Notes (Signed)
Pt safely discharged. Discharge packet provided with teach-back method. VS wnL and as per flow. IVs removed, Pt verbalized understanding. All questions and concerns addressed. Daugter present for transport.

## 2020-12-22 NOTE — Discharge Summary (Signed)
Physician Discharge Summary  Olivia Ross OFB:[id number] DOB: [birth year] DOA: 12/19/2020  PCP: Jolinda Croak, MD  Admit date: 12/19/2020 Discharge date: 12/22/2020  Admitted From: Home  Disposition:  Home with [facility]  Recommendations for Outpatient Follow-up:  Follow up with PCP in 1-2 weeks Please obtain BMP/CBC in one week Follow up with neurologist for further care of Dementia.   Home Health: Yes  Discharge Condition: Stable.  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  Patient been having issues with UTI due to GI problems, inability of patient's to keep herself clean from diarrhea.  She was treated for UTI recently with 7 days of oral antibiotics.   Patient admitted with leukocytosis, altered mental status, UA with pyuria.  Patient found to have E. coli bacteremia.    1-Acute metabolic encephalopathy: Also underlying dementia: Altered mental status, patient was more lethargic and weak and more confused at home.  Likely related to UTI and bacteremia. Treating for infectious process. Discussed with daughter, also dementia playing a role in her cognition. CT head: no acute intracranial pathology.  B 12. 330. Start supplement.    2-Sepsis secondary to urinary tract infection and E coli  bacteremia Patient presents with tachycardia, altered mental status, leukocytosis, lactic acidosis, source of infection UTI. Received  if IV fluids. Blood cultures growing E. coli. Treated with IV ceftriaxone. ID following in consultation. Recommend amoxicillin for total 7 days. Last day 9/28 Lactic acid peak 3.2 WBC at 17. WBC has decreased to 10/    3-Dementia: Continue with Aricept   Hypertension: resume amlodipine at discharge. Patient was taking it PR>  On  Irbersartan. Started on Metoprolol.    SVT, Hypomagnesemia; started low dose metoprolol.  Received IV magnesium.  Received oral potassium supplement  Mg normalized to 1.9.  Discharge on oral mg.   Deconditioning: PT eval recommending home health Hyperglycemia: 1C 5.7 pre Diabetic. Recommend diet.    History of Bilateral breast cancer: Status post bilateral mastectomy 2021.  Continue with hormone therapy.     Discharge Diagnoses:  Principal Problem:   E coli bacteremia Active Problems:   Acute encephalopathy   Encephalopathy   Generalized weakness   Acute cystitis without hematuria   Disorientation    Discharge Instructions  Discharge Instructions     Diet - low sodium heart healthy   Complete by: As directed    Increase activity slowly   Complete by: As directed       Allergies as of 12/22/2020       Reactions   Diphth-acell Pertussis-tetanus    Other reaction(s): Other (See Comments)   Dtap-hepatitis B Recomb-ipv Other (See Comments)   unknown   Norco [hydrocodone-acetaminophen] Nausea And Vomiting   Percocet [oxycodone-acetaminophen] Nausea And Vomiting        Medication List     TAKE these medications    acetaminophen 500 MG tablet Commonly known as: TYLENOL Take 500 mg by mouth every 6 (six) hours as needed for moderate pain.   amLODipine 5 MG tablet Commonly known as: NORVASC Take 5 mg by mouth daily as needed (if blodd pressure is over 120).   amoxicillin 500 MG capsule Commonly known as: AMOXIL Take 1 capsule (500 mg total) by mouth every 8 (eight) hours for 6 days.   carboxymethylcellulose 0.5 % Soln Commonly known as: REFRESH  PLUS Place 1 drop into both eyes 3 (three) times daily as needed (dry eyes).   cyanocobalamin 100 MCG tablet Take 1 tablet (100 mcg total) by mouth daily. Start taking on: December 23, 2020   dicyclomine 10 MG capsule Commonly known as: BENTYL Take 10 mg by mouth daily as needed for spasms.   donepezil 10  MG tablet Commonly known as: ARICEPT Take 10 mg by mouth at bedtime.   letrozole 2.5 MG tablet Commonly known as: FEMARA Take 2.5 mg by mouth daily.   magnesium oxide 400 (240 Mg) MG tablet Commonly known as: MAG-OX Take 0.5 tablets (200 mg total) by mouth 2 (two) times daily.   metoprolol tartrate 25 MG tablet Commonly known as: LOPRESSOR Take 0.5 tablets (12.5 mg total) by mouth 2 (two) times daily.   multivitamin with minerals Tabs tablet Take 1 tablet by mouth daily. Centrum   valsartan 160 MG tablet Commonly known as: DIOVAN Take 160 mg by mouth daily.        Follow-up Information     [facility] Follow up.   Specialty: Home Health Services Why: The home health agency will contact you for the first home visit. Contact information: [address] [place] [address] [phone]                Allergies  Allergen Reactions   Diphth-Acell Pertussis-Tetanus     Other reaction(s): Other (See Comments)   Dtap-Hepatitis B Recomb-Ipv Other (See Comments)    unknown   Norco [Hydrocodone-Acetaminophen] Nausea And Vomiting   Percocet [Oxycodone-Acetaminophen] Nausea And Vomiting    Consultations: ID   Procedures/Studies: CT HEAD WO CONTRAST (5MM)  Result Date: 12/20/2020 CLINICAL DATA:  Altered mental status. EXAM: CT HEAD WITHOUT CONTRAST TECHNIQUE: Contiguous axial images were obtained from the base of the skull through the vertex without intravenous contrast. COMPARISON:  Head CT dated 07/21/2020. FINDINGS: Brain: Mild age-related atrophy and chronic microvascular ischemic changes. 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 Small old left frontal infarcts. Small old cerebellar infarcts noted. There is no acute intracranial hemorrhage. No mass effect or midline shift no extra-axial fluid collection. Vascular: No hyperdense vessel or unexpected calcification. Skull: Normal. Negative for fracture  or focal lesion. Sinuses/Orbits: No acute finding. Other: None IMPRESSION: 1. No acute intracranial pathology. 2. Age-related atrophy and chronic microvascular ischemic changes. 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 Electronically Signed   By: Anner Crete M.D.   On: 12/20/2020 00:10   DG Pelvis Portable  Result Date: 12/20/2020 CLINICAL DATA:  Pain with ambulation.  Weakness and confusion. EXAM: PORTABLE PELVIS 1-2 VIEWS COMPARISON:  None. FINDINGS: Patient is rotated. The bones are diffusely under mineralized. No fracture. Mild bilateral hip joint space narrowing. No evidence of a vascular necrosis or focal bone lesion. Pubic symphysis and sacroiliac joints are congruent. Calcifications projecting superolateral to the left iliac crest likely represent gluteal granuloma. IMPRESSION: Osteopenia/osteoporosis. Mild bilateral hip joint space narrowing. No acute findings. Electronically Signed   By: Keith Rake M.D.   On: 12/20/2020 00:03   DG Chest Portable 1 View  Result Date: 12/20/2020 CLINICAL DATA:  Altered mental status EXAM: PORTABLE CHEST 1 VIEW COMPARISON:  07/21/2020 FINDINGS: The heart size and mediastinal contours are within normal limits. Aortic atherosclerosis. Both lungs are clear. No pleural effusion or pneumothorax. No acute osseous abnormality. Surgical clips overlie the right chest  wall. IMPRESSION: No active disease. Aortic Atherosclerosis (ICD10-I70.0). Electronically Signed   By: Merilyn Baba M.D.   On: 12/20/2020 00:02     Subjective: She is alert, report her BP was elevated last night. She is feeling well.   Discharge Exam: Vitals:   12/22/20 0736 12/22/20 0843  BP: (!) 155/50   Pulse: (!) 59 97  Resp: 16   Temp: 98 F (36.7 C)   SpO2: 100%      General: Pt is alert, awake, not in acute distress Cardiovascular: RRR, S1/S2 +, no rubs, no gallops Respiratory: CTA  bilaterally, no wheezing, no rhonchi Abdominal: Soft, NT, ND, bowel sounds + Extremities: no edema, no cyanosis    The results of significant diagnostics from this hospitalization (including imaging, microbiology, ancillary and laboratory) are listed below for reference.     Microbiology: Recent Results (from the past 240 hour(s))  Resp Panel by RT-PCR (Flu A&B, Covid) Nasopharyngeal Swab     Status: None   Collection Time: 12/19/20 11:37 PM   Specimen: Nasopharyngeal Swab; Nasopharyngeal(NP) swabs in vial transport medium  Result Value Ref Range Status   SARS Coronavirus 2 by RT PCR NEGATIVE NEGATIVE Final    Comment: (NOTE) SARS-CoV-2 target nucleic acids are NOT DETECTED.  The SARS-CoV-2 RNA is generally detectable in upper respiratory specimens during the acute phase of infection. The lowest concentration of SARS-CoV-2 viral copies this assay can detect is 138 copies/mL. A negative result does not preclude SARS-Cov-2 infection and should not be used as the sole basis for treatment or other patient management decisions. A negative result may occur with  improper specimen collection/handling, submission of specimen other than nasopharyngeal swab, presence of viral mutation(s) within the areas targeted by this assay, and inadequate number of viral copies(<138 copies/mL). A negative result must be combined with clinical observations, patient history, and epidemiological information. The expected result is Negative.  Fact Sheet for Patients:  EntrepreneurPulse.com.au  Fact Sheet for Healthcare Providers:  IncredibleEmployment.be  This test is no t yet approved or cleared by the [place] FDA and  has been authorized for detection and/or diagnosis of SARS-CoV-2 by FDA under an Emergency Use Authorization (EUA). This EUA will remain  in effect (meaning this test can be used) for the duration of the COVID-19 declaration under Section  564(b)(1) of the Act, 21 U.S.C.section 360bbb-3(b)(1), unless the authorization is terminated  or revoked sooner.       Influenza A by PCR NEGATIVE NEGATIVE Final   Influenza B by PCR NEGATIVE NEGATIVE Final    Comment: (NOTE) The Xpert Xpress SARS-CoV-2/FLU/RSV plus assay is intended as an aid in the diagnosis of influenza from Nasopharyngeal swab specimens and should not be used as a sole basis for treatment. Nasal washings and aspirates are unacceptable for Xpert Xpress SARS-CoV-2/FLU/RSV testing.  Fact Sheet for Patients: EntrepreneurPulse.com.au  Fact Sheet for Healthcare Providers: IncredibleEmployment.be  This test is not yet approved or cleared by the [place] FDA and has been authorized for detection and/or diagnosis of SARS-CoV-2 by FDA under an Emergency Use Authorization (EUA). This EUA will remain in effect (meaning this test can be used) for the duration of the COVID-19 declaration under Section 564(b)(1) of the Act, 21 U.S.C. section 360bbb-3(b)(1), unless the authorization is terminated or revoked.  Performed at [facility], [address] [place], [place] [address]   Urine Culture     Status: Abnormal   Collection Time: 12/20/20  1:25 AM   Specimen: Urine, Clean  Catch  Result Value Ref Range Status   Specimen Description   Final    URINE, CLEAN CATCH Performed at [facility], [address] [place], [place] [address]    Special Requests   Final    NONE Performed at [facility], [address] [place], [place] [address]    Culture 80,000 COLONIES/mL ESCHERICHIA COLI (A)  Final   Report Status 12/22/2020 FINAL  Final   Organism ID, Bacteria ESCHERICHIA COLI (A)  Final      Susceptibility   Escherichia coli - MIC*    AMPICILLIN <=2 SENSITIVE Sensitive     CEFAZOLIN <=4 SENSITIVE Sensitive     CEFEPIME <=0.12 SENSITIVE Sensitive     CEFTRIAXONE <=0.25 SENSITIVE Sensitive      CIPROFLOXACIN <=0.25 SENSITIVE Sensitive     GENTAMICIN <=1 SENSITIVE Sensitive     IMIPENEM <=0.25 SENSITIVE Sensitive     NITROFURANTOIN <=16 SENSITIVE Sensitive     TRIMETH/SULFA <=20 SENSITIVE Sensitive     AMPICILLIN/SULBACTAM <=2 SENSITIVE Sensitive     PIP/TAZO <=4 SENSITIVE Sensitive     * 80,000 COLONIES/mL ESCHERICHIA COLI  Culture, blood (routine x 2)     Status: Abnormal (Preliminary result)   Collection Time: 12/20/20  3:00 AM   Specimen: BLOOD RIGHT FOREARM  Result Value Ref Range Status   Specimen Description   Final    BLOOD RIGHT FOREARM Performed at [facility], [address] [place], [place] [address]    Special Requests   Final    BOTTLES DRAWN AEROBIC AND ANAEROBIC Blood Culture adequate volume Performed at [facility], [address] [place], [place] [address]    Culture  Setup Time   Final    GRAM NEGATIVE RODS ANAEROBIC BOTTLE ONLY IN BOTH AEROBIC AND ANAEROBIC BOTTLES CRITICAL RESULT CALLED TO, READ BACK BY AND VERIFIED WITH: PHARMD JAMES LEDFORD 12/20/20@21 :59 BY TW Performed at [facility], [address], [place], [place] [address]    Culture ESCHERICHIA COLI (A)  Final   Report Status PENDING  Incomplete   Organism ID, Bacteria ESCHERICHIA COLI  Final      Susceptibility   Escherichia coli - MIC*    AMPICILLIN <=2 SENSITIVE Sensitive     CEFAZOLIN <=4 SENSITIVE Sensitive     CEFEPIME <=0.12 SENSITIVE Sensitive     CEFTAZIDIME <=1 SENSITIVE Sensitive     CEFTRIAXONE <=0.25 SENSITIVE Sensitive     CIPROFLOXACIN <=0.25 SENSITIVE Sensitive     GENTAMICIN <=1 SENSITIVE Sensitive     IMIPENEM <=0.25 SENSITIVE Sensitive     TRIMETH/SULFA <=20 SENSITIVE Sensitive     AMPICILLIN/SULBACTAM <=2 SENSITIVE Sensitive     PIP/TAZO <=4 SENSITIVE Sensitive     * ESCHERICHIA COLI  Blood Culture ID Panel (Reflexed)     Status: Abnormal   Collection Time: 12/20/20  3:00 AM  Result Value Ref Range Status   Enterococcus faecalis  NOT DETECTED NOT DETECTED Final   Enterococcus Faecium NOT DETECTED NOT DETECTED Final   Listeria monocytogenes NOT DETECTED NOT DETECTED Final   Staphylococcus species NOT DETECTED NOT DETECTED Final   Staphylococcus aureus (BCID) NOT DETECTED NOT DETECTED Final   Staphylococcus epidermidis NOT DETECTED NOT DETECTED Final   Staphylococcus lugdunensis NOT DETECTED NOT DETECTED Final   Streptococcus species NOT DETECTED NOT DETECTED Final   Streptococcus agalactiae NOT DETECTED NOT DETECTED Final   Streptococcus pneumoniae NOT DETECTED NOT DETECTED Final   Streptococcus pyogenes  NOT DETECTED NOT DETECTED Final   A.calcoaceticus-baumannii NOT DETECTED NOT DETECTED Final   Bacteroides fragilis NOT DETECTED NOT DETECTED Final   Enterobacterales DETECTED (A) NOT DETECTED Final    Comment: Enterobacterales represent a large order of gram negative bacteria, not a single organism. CRITICAL RESULT CALLED TO, READ BACK BY AND VERIFIED WITH: PHARMD JAMES LEDFORD 12/20/20@21 :59 BY TW    Enterobacter cloacae complex NOT DETECTED NOT DETECTED Final   Escherichia coli DETECTED (A) NOT DETECTED Final    Comment: CRITICAL RESULT CALLED TO, READ BACK BY AND VERIFIED WITH: PHARMD JAMES LEDFORD 12/20/20@21 :59 BY TW    Klebsiella aerogenes NOT DETECTED NOT DETECTED Final   Klebsiella oxytoca NOT DETECTED NOT DETECTED Final   Klebsiella pneumoniae NOT DETECTED NOT DETECTED Final   Proteus species NOT DETECTED NOT DETECTED Final   Salmonella species NOT DETECTED NOT DETECTED Final   Serratia marcescens NOT DETECTED NOT DETECTED Final   Haemophilus influenzae NOT DETECTED NOT DETECTED Final   Neisseria meningitidis NOT DETECTED NOT DETECTED Final   Pseudomonas aeruginosa NOT DETECTED NOT DETECTED Final   Stenotrophomonas maltophilia NOT DETECTED NOT DETECTED Final   Candida albicans NOT DETECTED NOT DETECTED Final   Candida auris NOT DETECTED NOT DETECTED Final   Candida glabrata NOT DETECTED NOT DETECTED  Final   Candida krusei NOT DETECTED NOT DETECTED Final   Candida parapsilosis NOT DETECTED NOT DETECTED Final   Candida tropicalis NOT DETECTED NOT DETECTED Final   Cryptococcus neoformans/gattii NOT DETECTED NOT DETECTED Final   CTX-M ESBL NOT DETECTED NOT DETECTED Final   Carbapenem resistance IMP NOT DETECTED NOT DETECTED Final   Carbapenem resistance KPC NOT DETECTED NOT DETECTED Final   Carbapenem resistance NDM NOT DETECTED NOT DETECTED Final   Carbapenem resist OXA 48 LIKE NOT DETECTED NOT DETECTED Final   Carbapenem resistance VIM NOT DETECTED NOT DETECTED Final    Comment: Performed at [facility], [address] [address], [place], [place] [address]  Culture, blood (routine x 2)     Status: None (Preliminary result)   Collection Time: 12/20/20  6:19 PM   Specimen: BLOOD  Result Value Ref Range Status   Specimen Description BLOOD RIGHT ANTECUBITAL  Final   Special Requests AEROBIC BOTTLE ONLY Blood Culture adequate volume  Final   Culture   Final    NO GROWTH < 24 HOURS Performed at [facility], [place] [address], [place], [place] [address]    Report Status PENDING  Incomplete     Labs: BNP (last 3 results) No results for input(s): BNP in the last 8760 hours. Basic Metabolic Panel: Recent Labs  Lab 12/19/20 2227 12/21/20 0330 12/22/20 0328  NA 135 135 135  K 3.7 3.5 3.8  CL 105 105 105  CO2 23 20* 21*  GLUCOSE 184* 73 75  BUN 15 7* 6*  CREATININE 1.05* 0.73 0.64  CALCIUM 9.4 9.3 8.9  MG  --  1.6* 1.9   Liver Function Tests: Recent Labs  Lab 12/19/20 2227  AST 39  ALT 31  ALKPHOS 93  BILITOT 0.8  PROT 6.6  ALBUMIN 3.2*   No results for input(s): LIPASE, AMYLASE in the last 168 hours. No results for input(s): AMMONIA in the last 168 hours. CBC: Recent Labs  Lab 12/19/20 2227 12/21/20 0330 12/22/20 0817  WBC 17.8* 17.0* 10.7*  NEUTROABS 15.9*  --   --   HGB 13.3 12.3 11.8*  HCT 39.8 38.7 35.7*  MCV 89.6 91.3 89.3  PLT 205 191  212   Cardiac  Enzymes: No results for input(s): CKTOTAL, CKMB, CKMBINDEX, TROPONINI in the last 168 hours. BNP: Invalid input(s): POCBNP CBG: No results for input(s): GLUCAP in the last 168 hours. D-Dimer No results for input(s): DDIMER in the last 72 hours. Hgb A1c Recent Labs    12/21/20 0330  HGBA1C 5.7*   Lipid Profile No results for input(s): CHOL, HDL, LDLCALC, TRIG, CHOLHDL, LDLDIRECT in the last 72 hours. Thyroid function studies No results for input(s): TSH, T4TOTAL, T3FREE, THYROIDAB in the last 72 hours.  Invalid input(s): FREET3 Anemia work up Recent Labs    12/22/20 0328  VITAMINB12 383   Urinalysis    Component Value Date/Time   COLORURINE YELLOW 12/20/2020 0125   APPEARANCEUR HAZY (A) 12/20/2020 0125   LABSPEC 1.020 12/20/2020 0125   PHURINE 6.5 12/20/2020 0125   GLUCOSEU NEGATIVE 12/20/2020 0125   HGBUR SMALL (A) 12/20/2020 0125   BILIRUBINUR NEGATIVE 12/20/2020 0125   KETONESUR NEGATIVE 12/20/2020 0125   PROTEINUR 30 (A) 12/20/2020 0125   NITRITE NEGATIVE 12/20/2020 0125   LEUKOCYTESUR LARGE (A) 12/20/2020 0125   Sepsis Labs Invalid input(s): PROCALCITONIN,  WBC,  LACTICIDVEN Microbiology Recent Results (from the past 240 hour(s))  Resp Panel by RT-PCR (Flu A&B, Covid) Nasopharyngeal Swab     Status: None   Collection Time: 12/19/20 11:37 PM   Specimen: Nasopharyngeal Swab; Nasopharyngeal(NP) swabs in vial transport medium  Result Value Ref Range Status   SARS Coronavirus 2 by RT PCR NEGATIVE NEGATIVE Final    Comment: (NOTE) SARS-CoV-2 target nucleic acids are NOT DETECTED.  The SARS-CoV-2 RNA is generally detectable in upper respiratory specimens during the acute phase of infection. The lowest concentration of SARS-CoV-2 viral copies this assay can detect is 138 copies/mL. A negative result does not preclude SARS-Cov-2 infection and should not be used as the sole basis for treatment or other patient management decisions. A negative result may occur with   improper specimen collection/handling, submission of specimen other than nasopharyngeal swab, presence of viral mutation(s) within the areas targeted by this assay, and inadequate number of viral copies(<138 copies/mL). A negative result must be combined with clinical observations, patient history, and epidemiological information. The expected result is Negative.  Fact Sheet for Patients:  EntrepreneurPulse.com.au  Fact Sheet for Healthcare Providers:  IncredibleEmployment.be  This test is no t yet approved or cleared by the [place] FDA and  has been authorized for detection and/or diagnosis of SARS-CoV-2 by FDA under an Emergency Use Authorization (EUA). This EUA will remain  in effect (meaning this test can be used) for the duration of the COVID-19 declaration under Section 564(b)(1) of the Act, 21 U.S.C.section 360bbb-3(b)(1), unless the authorization is terminated  or revoked sooner.       Influenza A by PCR NEGATIVE NEGATIVE Final   Influenza B by PCR NEGATIVE NEGATIVE Final    Comment: (NOTE) The Xpert Xpress SARS-CoV-2/FLU/RSV plus assay is intended as an aid in the diagnosis of influenza from Nasopharyngeal swab specimens and should not be used as a sole basis for treatment. Nasal washings and aspirates are unacceptable for Xpert Xpress SARS-CoV-2/FLU/RSV testing.  Fact Sheet for Patients: EntrepreneurPulse.com.au  Fact Sheet for Healthcare Providers: IncredibleEmployment.be  This test is not yet approved or cleared by the [place] FDA and has been authorized for detection and/or diagnosis of SARS-CoV-2 by FDA under an Emergency Use Authorization (EUA). This EUA will remain in effect (meaning this test can be used) for the duration of the COVID-19 declaration  under Section 564(b)(1) of the Act, 21 U.S.C. section 360bbb-3(b)(1), unless the authorization is terminated  or revoked.  Performed at [facility], [address] [place], [place] [address]   Urine Culture     Status: Abnormal   Collection Time: 12/20/20  1:25 AM   Specimen: Urine, Clean Catch  Result Value Ref Range Status   Specimen Description   Final    URINE, CLEAN CATCH Performed at [facility], [address] [place], [place] [address]    Special Requests   Final    NONE Performed at [facility], [address] [place], [place] [address]    Culture 80,000 COLONIES/mL ESCHERICHIA COLI (A)  Final   Report Status 12/22/2020 FINAL  Final   Organism ID, Bacteria ESCHERICHIA COLI (A)  Final      Susceptibility   Escherichia coli - MIC*    AMPICILLIN <=2 SENSITIVE Sensitive     CEFAZOLIN <=4 SENSITIVE Sensitive     CEFEPIME <=0.12 SENSITIVE Sensitive     CEFTRIAXONE <=0.25 SENSITIVE Sensitive     CIPROFLOXACIN <=0.25 SENSITIVE Sensitive     GENTAMICIN <=1 SENSITIVE Sensitive     IMIPENEM <=0.25 SENSITIVE Sensitive     NITROFURANTOIN <=16 SENSITIVE Sensitive     TRIMETH/SULFA <=20 SENSITIVE Sensitive     AMPICILLIN/SULBACTAM <=2 SENSITIVE Sensitive     PIP/TAZO <=4 SENSITIVE Sensitive     * 80,000 COLONIES/mL ESCHERICHIA COLI  Culture, blood (routine x 2)     Status: Abnormal (Preliminary result)   Collection Time: 12/20/20  3:00 AM   Specimen: BLOOD RIGHT FOREARM  Result Value Ref Range Status   Specimen Description   Final    BLOOD RIGHT FOREARM Performed at [facility], [address] [place], [place] [address]    Special Requests   Final    BOTTLES DRAWN AEROBIC AND ANAEROBIC Blood Culture adequate volume Performed at [facility], [address] [place], [place] [address]    Culture  Setup Time   Final    GRAM NEGATIVE RODS ANAEROBIC BOTTLE ONLY IN BOTH AEROBIC AND ANAEROBIC BOTTLES CRITICAL RESULT CALLED TO, READ BACK BY AND VERIFIED WITH: PHARMD JAMES LEDFORD 12/20/20@21 :59 BY TW Performed at [facility], [place] [address], [place], [place] [address]    Culture ESCHERICHIA COLI (A)  Final   Report Status PENDING  Incomplete   Organism ID, Bacteria ESCHERICHIA COLI  Final      Susceptibility   Escherichia coli - MIC*    AMPICILLIN <=2 SENSITIVE Sensitive     CEFAZOLIN <=4 SENSITIVE Sensitive     CEFEPIME <=0.12 SENSITIVE Sensitive     CEFTAZIDIME <=1 SENSITIVE Sensitive     CEFTRIAXONE <=0.25 SENSITIVE Sensitive     CIPROFLOXACIN <=0.25 SENSITIVE Sensitive     GENTAMICIN <=1 SENSITIVE Sensitive     IMIPENEM <=0.25 SENSITIVE Sensitive     TRIMETH/SULFA <=20 SENSITIVE Sensitive     AMPICILLIN/SULBACTAM <=2 SENSITIVE Sensitive     PIP/TAZO <=4 SENSITIVE Sensitive     * ESCHERICHIA COLI  Blood Culture ID Panel (Reflexed)     Status: Abnormal   Collection Time: 12/20/20  3:00 AM  Result Value Ref Range Status   Enterococcus faecalis NOT DETECTED NOT DETECTED Final   Enterococcus Faecium NOT DETECTED NOT DETECTED Final   Listeria monocytogenes NOT DETECTED NOT DETECTED Final   Staphylococcus species NOT DETECTED NOT DETECTED Final   Staphylococcus aureus (  BCID) NOT DETECTED NOT DETECTED Final   Staphylococcus epidermidis NOT DETECTED NOT DETECTED Final   Staphylococcus lugdunensis NOT DETECTED NOT DETECTED Final   Streptococcus species NOT DETECTED NOT DETECTED Final   Streptococcus agalactiae NOT DETECTED NOT DETECTED Final   Streptococcus pneumoniae NOT DETECTED NOT DETECTED Final   Streptococcus pyogenes NOT DETECTED NOT DETECTED Final   A.calcoaceticus-baumannii NOT DETECTED NOT DETECTED Final   Bacteroides fragilis NOT DETECTED NOT DETECTED Final   Enterobacterales DETECTED (A) NOT DETECTED Final    Comment: Enterobacterales represent a large order of gram negative bacteria, not a single organism. CRITICAL RESULT CALLED TO, READ BACK BY AND VERIFIED WITH: PHARMD JAMES LEDFORD 12/20/20@21 :59 BY TW    Enterobacter cloacae complex NOT DETECTED NOT DETECTED Final   Escherichia  coli DETECTED (A) NOT DETECTED Final    Comment: CRITICAL RESULT CALLED TO, READ BACK BY AND VERIFIED WITH: PHARMD JAMES LEDFORD 12/20/20@21 :59 BY TW    Klebsiella aerogenes NOT DETECTED NOT DETECTED Final   Klebsiella oxytoca NOT DETECTED NOT DETECTED Final   Klebsiella pneumoniae NOT DETECTED NOT DETECTED Final   Proteus species NOT DETECTED NOT DETECTED Final   Salmonella species NOT DETECTED NOT DETECTED Final   Serratia marcescens NOT DETECTED NOT DETECTED Final   Haemophilus influenzae NOT DETECTED NOT DETECTED Final   Neisseria meningitidis NOT DETECTED NOT DETECTED Final   Pseudomonas aeruginosa NOT DETECTED NOT DETECTED Final   Stenotrophomonas maltophilia NOT DETECTED NOT DETECTED Final   Candida albicans NOT DETECTED NOT DETECTED Final   Candida auris NOT DETECTED NOT DETECTED Final   Candida glabrata NOT DETECTED NOT DETECTED Final   Candida krusei NOT DETECTED NOT DETECTED Final   Candida parapsilosis NOT DETECTED NOT DETECTED Final   Candida tropicalis NOT DETECTED NOT DETECTED Final   Cryptococcus neoformans/gattii NOT DETECTED NOT DETECTED Final   CTX-M ESBL NOT DETECTED NOT DETECTED Final   Carbapenem resistance IMP NOT DETECTED NOT DETECTED Final   Carbapenem resistance KPC NOT DETECTED NOT DETECTED Final   Carbapenem resistance NDM NOT DETECTED NOT DETECTED Final   Carbapenem resist OXA 48 LIKE NOT DETECTED NOT DETECTED Final   Carbapenem resistance VIM NOT DETECTED NOT DETECTED Final    Comment: Performed at [facility], [place] [address] [place], [place] [address]  Culture, blood (routine x 2)     Status: None (Preliminary result)   Collection Time: 12/20/20  6:19 PM   Specimen: BLOOD  Result Value Ref Range Status   Specimen Description BLOOD RIGHT ANTECUBITAL  Final   Special Requests AEROBIC BOTTLE ONLY Blood Culture adequate volume  Final   Culture   Final    NO GROWTH < 24 HOURS Performed at [facility], [place] [address] [place], [place]  [address]    Report Status PENDING  Incomplete     Time coordinating discharge: 40 minutes  SIGNED:   Elmarie Shiley, MD  [facility]

## 2020-12-22 NOTE — Progress Notes (Signed)
Occupational Therapy Evaluation Patient Details Name: Olivia Ross MRN: [id number] DOB: 1946/01/21 Today's Date: 12/22/2020   History of Present Illness 75 y.o. female presents to ED 12/19/20 with  acute mentation changes.  blood pressure fluctuated with systolic [id number], blood work showed elevated lactate 2.3, WBC 17.  UA showed pyuria. 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 PTA pt reported that she was independent with all ADL's using a RW. At this time, pt requires max verbal cueing to complete tasks due to cognition, as pt forgets what she is doing as she is doing it. She has overall good ROM and functional mobility, however is unsafe without assist for all ADL's and functional mobility. OT will continue to follow acutely.     Recommendations for follow up therapy are one component of a multi-disciplinary discharge planning process, led by the attending physician.  Recommendations may be updated based on patient status, additional functional criteria and insurance authorization.   Follow Up Recommendations  Home health OT;Supervision/Assistance - 24 hour    Equipment Recommendations  None recommended by OT    Recommendations for Other Services       Precautions / Restrictions Precautions Precautions: Fall Restrictions Weight Bearing Restrictions: No      Mobility Bed Mobility Overal bed mobility: Needs Assistance Bed Mobility: Supine to Sit     Supine to sit: HOB elevated;Supervision     General bed mobility comments: maximal cuing for sequencing, had to physically remove grip from bedrail to be able to come to upright, with increased time and effort scoots to EoB    Transfers Overall transfer level: Needs assistance   Transfers: Sit to/from Stand Sit to Stand: Min assist         General  transfer comment: pt counted to 3 but then did not start to standing, maxA verbal cuing to initiate standing, min A to power up.    Balance Overall balance assessment: Needs assistance Sitting-balance support: Feet supported;No upper extremity supported Sitting balance-Leahy Scale: Fair     Standing balance support: Bilateral upper extremity supported;During functional activity Standing balance-Leahy Scale: Poor Standing balance comment: requires UE support                           ADL either performed or assessed with clinical judgement   ADL Overall ADL's : Needs assistance/impaired                                       General ADL Comments: Pt has generally good movements for ADL's however she requires max verbal cueing as she forgets tasks as she is completing them. Pt also has difficulty following simple commands due to confusion     Vision Baseline Vision/History: 1 Wears glasses Ability to See in Adequate Light: 2 Moderately impaired Patient Visual Report: No change from baseline Vision Assessment?: No apparent visual deficits     Perception Perception Perception Tested?: No   Praxis Praxis Praxis tested?: Not tested    Pertinent Vitals/Pain Pain Assessment: No/denies pain Faces Pain Scale: Hurts little more Pain Location: R hip with weightbearing Pain Descriptors / Indicators: Grimacing;Guarding;Discomfort     Hand Dominance Right   Extremity/Trunk Assessment Upper Extremity Assessment Upper Extremity Assessment: Generalized  weakness   Lower Extremity Assessment Lower Extremity Assessment: Defer to PT evaluation   Cervical / Trunk Assessment Cervical / Trunk Assessment: Kyphotic   Communication Communication Communication: No difficulties   Cognition Arousal/Alertness: Awake/alert Behavior During Therapy: WFL for tasks assessed/performed Overall Cognitive Status: Impaired/Different from baseline Area of Impairment:  Orientation;Attention;Memory;Following commands;Safety/judgement;Awareness;Problem solving                 Orientation Level: Disoriented to;Time Current Attention Level: Selective Memory: Decreased short-term memory Following Commands: Follows one step commands inconsistently;Follows one step commands with increased time Safety/Judgement: Decreased awareness of safety;Decreased awareness of deficits Awareness: Intellectual Problem Solving: Slow processing;Decreased initiation;Difficulty sequencing;Requires verbal cues;Requires tactile cues General Comments: requires maximal multimodal cuing to perform tasks, forgets what she is doing while performing tasks   General Comments  VSS on RA    Exercises     Shoulder Instructions      Home Living Family/patient expects to be discharged to:: Private residence Living Arrangements: Children Available Help at Discharge: Family;Available 24 hours/day Type of Home: Apartment Home Access: Stairs to enter [employer] of Steps: lots   Home Layout: One level     Bathroom Shower/Tub: Occupational psychologist: Standard     Home Equipment: Environmental consultant - 2 wheels;Shower seat   Additional Comments: Set up will need to be verified with daughter      Prior Functioning/Environment Level of Independence: Needs assistance  Gait / Transfers Assistance Needed: walks in apartment with RW ADL's / Homemaking Assistance Needed: daughter assists with bathing and dressing, provides iADLs            OT Problem List: Decreased strength;Decreased activity tolerance;Impaired balance (sitting and/or standing);Decreased coordination;Decreased cognition;Decreased safety awareness;Decreased knowledge of use of DME or AE      OT Treatment/Interventions: Self-care/ADL training;Therapeutic exercise;Energy conservation;DME and/or AE instruction;Therapeutic activities;Cognitive remediation/compensation;Patient/family education;Balance  training    OT Goals(Current goals can be found in the care plan section) Acute Rehab OT Goals Patient Stated Goal: To l;eave here OT Goal Formulation: Patient unable to participate in goal setting Time For Goal Achievement: 01/04/21 Potential to Achieve Goals: Good ADL Goals Pt Will Perform Grooming: with supervision;standing Pt Will Perform Upper Body Bathing: with supervision;sitting Pt Will Perform Lower Body Bathing: with min guard assist;sitting/lateral leans;sit to/from stand Pt Will Transfer to Toilet: ambulating;with supervision Pt Will Perform Toileting - Clothing Manipulation and hygiene: with supervision;sitting/lateral leans;sit to/from stand  OT Frequency: Min 2X/week   Barriers to D/C:            Co-evaluation              AM-PAC OT "6 Clicks" Daily Activity     Outcome Measure Help from another person eating meals?: A Little Help from another person taking care of personal grooming?: A Little Help from another person toileting, which includes using toliet, bedpan, or urinal?: A Little Help from another person bathing (including washing, rinsing, drying)?: A Little Help from another person to put on and taking off regular upper body clothing?: A Little Help from another person to put on and taking off regular lower body clothing?: A Little 6 Click Score: 18   End of Session Equipment Utilized During Treatment: Gait belt;Rolling walker Nurse Communication: Mobility status  Activity Tolerance: Patient tolerated treatment well Patient left: in bed;with call bell/phone within reach;with bed alarm set  OT Visit Diagnosis: Unsteadiness on feet (R26.81);Other abnormalities of gait and mobility (R26.89);Muscle weakness (generalized) (M62.81)  Time: 9191-6606 OT Time Calculation (min): 17 min Charges:  OT General Charges $OT Visit: 1 Visit OT Evaluation $OT Eval Moderate Complexity: 1 Mod  Janki Dike H., OTR/L Acute Rehabilitation   Jayveon Convey Elane  Yolanda Bonine 12/22/2020, 2:15 PM

## 2020-12-25 LAB — CULTURE, BLOOD (ROUTINE X 2)
Culture: NO GROWTH
Special Requests: ADEQUATE

## 2020-12-27 LAB — CULTURE, BLOOD (ROUTINE X 2): Special Requests: ADEQUATE

## 2021-01-23 ENCOUNTER — Emergency Department (HOSPITAL_COMMUNITY): Payer: Medicare PPO

## 2021-01-23 ENCOUNTER — Encounter (HOSPITAL_COMMUNITY): Payer: Self-pay

## 2021-01-23 ENCOUNTER — Emergency Department (HOSPITAL_COMMUNITY)
Admission: EM | Admit: 2021-01-23 | Discharge: 2021-01-24 | Disposition: A | Discharge status: Home / Self Care | Payer: Medicare PPO | Attending: Emergency Medicine | Admitting: Emergency Medicine

## 2021-01-23 ENCOUNTER — Other Ambulatory Visit: Payer: Self-pay

## 2021-01-23 DIAGNOSIS — R000 Tachycardia, unspecified: Secondary | ICD-10-CM | POA: Diagnosis not present

## 2021-01-23 DIAGNOSIS — Z20822 Contact with and (suspected) exposure to covid-19: Secondary | ICD-10-CM | POA: Diagnosis not present

## 2021-01-23 DIAGNOSIS — N390 Urinary tract infection, site not specified: Secondary | ICD-10-CM | POA: Diagnosis not present

## 2021-01-23 DIAGNOSIS — Z79899 Other long term (current) drug therapy: Secondary | ICD-10-CM | POA: Insufficient documentation

## 2021-01-23 DIAGNOSIS — R791 Abnormal coagulation profile: Secondary | ICD-10-CM | POA: Diagnosis not present

## 2021-01-23 DIAGNOSIS — R410 Disorientation, unspecified: Secondary | ICD-10-CM | POA: Diagnosis not present

## 2021-01-23 DIAGNOSIS — Z859 Personal history of malignant neoplasm, unspecified: Secondary | ICD-10-CM | POA: Diagnosis not present

## 2021-01-23 DIAGNOSIS — R82998 Other abnormal findings in urine: Secondary | ICD-10-CM | POA: Diagnosis present

## 2021-01-23 DIAGNOSIS — I10 Essential (primary) hypertension: Secondary | ICD-10-CM | POA: Diagnosis not present

## 2021-01-23 LAB — COMPREHENSIVE METABOLIC PANEL
ALT: 27 U/L (ref 0–44)
AST: 38 U/L (ref 15–41)
Albumin: 3.1 g/dL — ABNORMAL LOW (ref 3.5–5.0)
Alkaline Phosphatase: 90 U/L (ref 38–126)
Anion gap: 9 (ref 5–15)
BUN: 19 mg/dL (ref 8–23)
CO2: 20 mmol/L — ABNORMAL LOW (ref 22–32)
Calcium: 9.6 mg/dL (ref 8.9–10.3)
Chloride: 107 mmol/L (ref 98–111)
Creatinine, Ser: 0.84 mg/dL (ref 0.44–1.00)
GFR, Estimated: >60 mL/min (ref >60)
Glucose, Bld: 65 mg/dL — ABNORMAL LOW (ref 70–99)
Potassium: 4.2 mmol/L (ref 3.5–5.1)
Sodium: 136 mmol/L (ref 135–145)
Total Bilirubin: 1.1 mg/dL (ref 0.3–1.2)
Total Protein: 6.1 g/dL — ABNORMAL LOW (ref 6.5–8.1)

## 2021-01-23 LAB — CBC WITH DIFFERENTIAL/PLATELET
Abs Immature Granulocytes: 0.06 10*3/uL (ref 0.00–0.07)
Basophils Absolute: 0 10*3/uL (ref 0.0–0.1)
Basophils Relative: 0 %
Eosinophils Absolute: 0 10*3/uL (ref 0.0–0.5)
Eosinophils Relative: 0 %
HCT: 34.7 % — ABNORMAL LOW (ref 36.0–46.0)
Hemoglobin: 11.4 g/dL — ABNORMAL LOW (ref 12.0–15.0)
Immature Granulocytes: 0 %
Lymphocytes Relative: 13 %
Lymphs Abs: 1.9 10*3/uL (ref 0.7–4.0)
MCH: 30.1 pg (ref 26.0–34.0)
MCHC: 32.9 g/dL (ref 30.0–36.0)
MCV: 91.6 fL (ref 80.0–100.0)
Monocytes Absolute: 0.9 10*3/uL (ref 0.1–1.0)
Monocytes Relative: 6 %
Neutro Abs: 11.6 10*3/uL — ABNORMAL HIGH (ref 1.7–7.7)
Neutrophils Relative %: 81 %
Platelets: 194 10*3/uL (ref 150–400)
RBC: 3.79 MIL/uL — ABNORMAL LOW (ref 3.87–5.11)
RDW: 13.5 % (ref 11.5–15.5)
WBC: 14.5 10*3/uL — ABNORMAL HIGH (ref 4.0–10.5)
nRBC: 0 % (ref 0.0–0.2)

## 2021-01-23 LAB — URINALYSIS, ROUTINE W REFLEX MICROSCOPIC
Bilirubin Urine: NEGATIVE
Glucose, UA: NEGATIVE mg/dL
Hgb urine dipstick: NEGATIVE
Ketones, ur: 5 mg/dL — AB
Nitrite: NEGATIVE
Protein, ur: NEGATIVE mg/dL
Specific Gravity, Urine: 1.013 (ref 1.005–1.030)
WBC, UA: >50 WBC/hpf — ABNORMAL HIGH (ref 0–5)
pH: 6 (ref 5.0–8.0)

## 2021-01-23 LAB — LACTIC ACID, PLASMA: Lactic Acid, Venous: 1.3 mmol/L (ref 0.5–1.9)

## 2021-01-23 LAB — LIPASE, BLOOD: Lipase: 26 U/L (ref 11–51)

## 2021-01-23 LAB — PROTIME-INR
INR: 1.2 (ref 0.8–1.2)
Prothrombin Time: 14.8 seconds (ref 11.4–15.2)

## 2021-01-23 LAB — TROPONIN I (HIGH SENSITIVITY): Troponin I (High Sensitivity): 22 ng/L — ABNORMAL HIGH (ref <18)

## 2021-01-23 MED ORDER — METOPROLOL TARTRATE 5 MG/5ML IV SOLN
5.0000 mg | Freq: Once | INTRAVENOUS | Status: AC
  Administered 2021-01-24: 5 mg via INTRAVENOUS
  Filled 2021-01-23: qty 5

## 2021-01-23 MED ORDER — LACTATED RINGERS IV SOLN: INTRAVENOUS | Status: DC

## 2021-01-23 NOTE — ED Provider Notes (Signed)
[facility] EMERGENCY DEPARTMENT Provider Note   CSN: [id number] Arrival date & time: 01/23/21  1859     History Chief Complaint  Patient presents with   Urinary Frequency    Olivia Ross is a 75 y.o. female.  HPI Patient daughter reports she has suspicion for UTI.  She reports her mom was generally very weak today.  She was trying to shower and bathe her and she just slumped over.  Patient daughter reports that her urine has smelled very strong lately.  He also reports that she is noted some mild confusion.  She denies any pain.  She reports she feels fine.  No vomiting.  Patient does not drink as much water as her daughter think she should but she is eating.    Past Medical History:  Diagnosis Date   Cancer [facility])    Hypertension     Patient Active Problem List   Diagnosis Date Noted   E coli bacteremia 12/21/2020   Acute encephalopathy 12/20/2020   Encephalopathy 12/20/2020   Generalized weakness    Acute cystitis without hematuria    Disorientation     History reviewed. No pertinent surgical history.   OB History   No obstetric history on file.     History reviewed. No pertinent family history.  Social History   Tobacco Use   Smoking status: Never   Smokeless tobacco: Never  Substance Use Topics   Alcohol use: Never   Drug use: Never    Home Medications Prior to Admission medications   Medication Sig Start Date End Date Taking? Authorizing Provider  amoxicillin-clavulanate (AUGMENTIN) 875-125 MG tablet Take 1 tablet by mouth 2 (two) times daily. One po bid x 7 days 01/24/21  Yes Beula Joyner, Jeannie Done, MD  acetaminophen (TYLENOL) 500 MG tablet Take 500 mg by mouth every 6 (six) hours as needed for moderate pain.    [provider]  amLODipine (NORVASC) 5 MG tablet Take 5 mg by mouth daily as needed (if blodd pressure is over 120). 11/02/20   [provider]  carboxymethylcellulose (REFRESH PLUS) 0.5 % SOLN Place 1 drop into  both eyes 3 (three) times daily as needed (dry eyes).    [provider]  dicyclomine (BENTYL) 10 MG capsule Take 10 mg by mouth daily as needed for spasms. 07/24/20   [provider]  donepezil (ARICEPT) 10 MG tablet Take 10 mg by mouth at bedtime. 05/02/20   [provider]  letrozole (FEMARA) 2.5 MG tablet Take 2.5 mg by mouth daily. 12/12/20   [provider]  magnesium oxide (MAG-OX) 400 (240 Mg) MG tablet Take 0.5 tablets (200 mg total) by mouth 2 (two) times daily. 12/22/20   Regalado, Belkys A, MD  metoprolol tartrate (LOPRESSOR) 25 MG tablet Take 0.5 tablets (12.5 mg total) by mouth 2 (two) times daily. 12/22/20   Regalado, Belkys A, MD  Multiple Vitamin (MULTIVITAMIN WITH MINERALS) TABS tablet Take 1 tablet by mouth daily. Centrum    [provider]  valsartan (DIOVAN) 160 MG tablet Take 160 mg by mouth daily. 12/12/20   [provider]  vitamin B-12 100 MCG tablet Take 1 tablet (100 mcg total) by mouth daily. 12/23/20   Regalado, Cassie Freer, MD    Allergies    Diphth-acell pertussis-tetanus, Dtap-hepatitis b recomb-ipv, Norco [hydrocodone-acetaminophen], and Percocet [oxycodone-acetaminophen]  Review of Systems   Review of Systems 10 systems reviewed negative except as per HPI Physical Exam Updated Vital Signs BP (!) 187/70  Pulse 74   Temp 99.6 F (37.6 C) (Oral)   Resp (!) 6   Ht 5\' 5"  (1.651 m)   Wt 63.5 kg   SpO2 100%   BMI 23.30 kg/m   Physical Exam Constitutional:      Comments: Patient is alert and pleasant.  No acute distress.  No respiratory distress.  HENT:     Mouth/Throat:     Pharynx: Oropharynx is clear.  Eyes:     Extraocular Movements: Extraocular movements intact.  Cardiovascular:     Rate and Rhythm: Normal rate and regular rhythm.  Pulmonary:     Effort: Pulmonary effort is normal.     Breath sounds: Normal breath sounds.  Abdominal:     General: There is no distension.     Palpations: Abdomen is  soft.     Tenderness: There is no abdominal tenderness. There is no guarding.  Musculoskeletal:        General: No swelling. Normal range of motion.     Right lower leg: No edema.     Left lower leg: No edema.  Skin:    General: Skin is warm and dry.  Neurological:     General: No focal deficit present.     Coordination: Coordination normal.  Psychiatric:        Mood and Affect: Mood normal.    ED Results / Procedures / Treatments   Labs (all labs ordered are listed, but only abnormal results are displayed) Labs Reviewed  COMPREHENSIVE METABOLIC PANEL - Abnormal; Notable for the following components:      Result Value   CO2 20 (*)    Glucose, Bld 65 (*)    Total Protein 6.1 (*)    Albumin 3.1 (*)    All other components within normal limits  URINALYSIS, ROUTINE W REFLEX MICROSCOPIC - Abnormal; Notable for the following components:   APPearance CLOUDY (*)    Ketones, ur 5 (*)    Leukocytes,Ua LARGE (*)    WBC, UA >50 (*)    Bacteria, UA MANY (*)    All other components within normal limits  CBC WITH DIFFERENTIAL/PLATELET - Abnormal; Notable for the following components:   WBC 14.5 (*)    RBC 3.79 (*)    Hemoglobin 11.4 (*)    HCT 34.7 (*)    Neutro Abs 11.6 (*)    All other components within normal limits  TROPONIN I (HIGH SENSITIVITY) - Abnormal; Notable for the following components:   Troponin I (High Sensitivity) 22 (*)    All other components within normal limits  URINE CULTURE  CULTURE, BLOOD (ROUTINE X 2)  CULTURE, BLOOD (ROUTINE X 2)  RESP PANEL BY RT-PCR (FLU A&B, COVID) [id number]  PROTIME-INR  LACTIC ACID, PLASMA  BRAIN NATRIURETIC PEPTIDE  LIPASE, BLOOD  CBC WITH DIFFERENTIAL/PLATELET    EKG EKG Interpretation  Date/Time:  Tuesday January 23 2021 19:18:26 EDT Ventricular Rate:  120 PR Interval:  163 QRS Duration: 70 QT Interval:  289 QTC Calculation: 409 R Axis:   51 Text Interpretation: Sinus tachycardia Abnormal R-wave progression, early  transition Borderline repolarization abnormality agree, rate increased compared to previous Confirmed by Charlesetta Shanks [phone]) on 01/24/2021 1:00:37 AM  Radiology CT Head Wo Contrast  Result Date: 01/23/2021 CLINICAL DATA:  Mental status change EXAM: CT HEAD WITHOUT CONTRAST TECHNIQUE: Contiguous axial images were obtained from the base of the skull through the vertex without intravenous contrast. COMPARISON:  CT 12/20/2020, 07/21/2020 FINDINGS: Brain: No acute territorial infarction, hemorrhage or  intracranial mass. 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 Chronic left frontal lobe infarct. Multiple chronic lacunar infarcts in the basal ganglia. Multiple small chronic cerebellar infarcts. Hypodensity within the pons on sagittal view, suspect related to artifact. There may be chronic small vessel ischemic change in the pons. Stable ventricle size. Atrophy and chronic small vessel ischemic changes of the white matter. Vascular: No hyperdense vessels. Carotid vascular calcification. Small of gas in the cavernous sinus possibly related to line placement. Skull: Normal. Negative for fracture or focal lesion. Sinuses/Orbits: No acute finding. Other: None IMPRESSION: 1. No definite CT evidence for acute intracranial abnormality. 2. Atrophy and chronic small vessel ischemic changes of the white matter. Multiple chronic bilateral infarcts including large chronic right MCA infarct. Hypodensity in the pons is suspected to be due to combination of artifact and small vessel disease. Electronically Signed   By: Donavan Foil M.D.   On: 01/23/2021 22:32   DG Chest Port 1 View  Result Date: 01/23/2021 CLINICAL DATA:  Generalized weakness EXAM: PORTABLE CHEST 1 VIEW COMPARISON:  12/19/2020 FINDINGS: Heart and mediastinal contours are within normal limits. No focal opacities or effusions. No acute bony abnormality. IMPRESSION: No active disease. Electronically  Signed   By: Rolm Baptise M.D.   On: 01/23/2021 22:16    Procedures Procedures   Medications Ordered in ED Medications  lactated ringers infusion ( Intravenous New Bag/Given 01/24/21 0032)  cefTRIAXone (ROCEPHIN) 1 g in sodium chloride 0.9 % 100 mL IVPB (1 g Intravenous New Bag/Given 01/24/21 0053)  metoprolol tartrate (LOPRESSOR) injection 5 mg (5 mg Intravenous Given 01/24/21 0026)    ED Course  I have reviewed the triage vital signs and the nursing notes.  Pertinent labs & imaging results that were available during my care of the patient were reviewed by me and considered in my medical decision making (see chart for details).    MDM Rules/Calculators/A&P                           Presents as outlined.  Urine is grossly positive.  Patient is nontoxic.  Mental status clear.  Patient is alert without respiratory distress.  At this time will treat with a gram of Rocephin and start oral antibiotics.  Patient is cared for by her daughter in the home.  Turn precautions reviewed.  Follow-up plan reviewed. Final Clinical Impression(s) / ED Diagnoses Final diagnoses:  Lower urinary tract infectious disease    Rx / DC Orders ED Discharge Orders          Ordered    amoxicillin-clavulanate (AUGMENTIN) 875-125 MG tablet  2 times daily        01/24/21 0017             Charlesetta Shanks, MD 01/24/21 0101

## 2021-01-23 NOTE — ED Triage Notes (Signed)
Pt arrives via EMS from home with UTI s/s x 1 month. Increased urine odor and increased confusion & weakness today. A&O at present.

## 2021-01-24 LAB — RESP PANEL BY RT-PCR (FLU A&B, COVID) ARPGX2
Influenza A by PCR: NEGATIVE
Influenza B by PCR: NEGATIVE
SARS Coronavirus 2 by RT PCR: NEGATIVE

## 2021-01-24 LAB — BRAIN NATRIURETIC PEPTIDE: B Natriuretic Peptide: 69 pg/mL (ref 0.0–100.0)

## 2021-01-24 MED ORDER — AMOXICILLIN-POT CLAVULANATE 875-125 MG PO TABS: 1.0000 | ORAL_TABLET | Freq: Two times a day (BID) | ORAL | 0 refills | Status: DC

## 2021-01-24 MED ORDER — SODIUM CHLORIDE 0.9 % IV SOLN
1.0000 g | Freq: Once | INTRAVENOUS | Status: AC
  Administered 2021-01-24: 1 g via INTRAVENOUS
  Filled 2021-01-24: qty 10

## 2021-01-24 NOTE — Discharge Instructions (Signed)
1.  You have been given an IV dose of antibiotics in the emergency department.  Start your Augmentin tomorrow as prescribed. 2.  Follow-up with your doctor for recheck within the next couple of days. 3.  Call [facility] tomorrow morning to schedule follow-up appointment for recurrent urinary tract infections 4.  Return to the emergency department if you have worsening or changing symptoms.

## 2021-01-25 LAB — URINE CULTURE: Culture: >=100000 — AB

## 2021-01-28 LAB — CULTURE, BLOOD (ROUTINE X 2): Culture: NO GROWTH

## 2021-02-02 ENCOUNTER — Inpatient Hospital Stay (HOSPITAL_BASED_OUTPATIENT_CLINIC_OR_DEPARTMENT_OTHER)
Admission: EM | Admit: 2021-02-02 | Discharge: 2021-02-05 | DRG: 871 | Disposition: A | Discharge status: Home Health | Payer: Medicare PPO | Attending: Internal Medicine | Admitting: Internal Medicine

## 2021-02-02 ENCOUNTER — Emergency Department (HOSPITAL_BASED_OUTPATIENT_CLINIC_OR_DEPARTMENT_OTHER): Payer: Medicare PPO

## 2021-02-02 ENCOUNTER — Other Ambulatory Visit: Payer: Self-pay

## 2021-02-02 DIAGNOSIS — E872 Acidosis, unspecified: Secondary | ICD-10-CM | POA: Diagnosis present

## 2021-02-02 DIAGNOSIS — Z79899 Other long term (current) drug therapy: Secondary | ICD-10-CM

## 2021-02-02 DIAGNOSIS — Z79811 Long term (current) use of aromatase inhibitors: Secondary | ICD-10-CM

## 2021-02-02 DIAGNOSIS — R4182 Altered mental status, unspecified: Secondary | ICD-10-CM

## 2021-02-02 DIAGNOSIS — G9341 Metabolic encephalopathy: Secondary | ICD-10-CM | POA: Diagnosis present

## 2021-02-02 DIAGNOSIS — G9340 Encephalopathy, unspecified: Secondary | ICD-10-CM | POA: Diagnosis not present

## 2021-02-02 DIAGNOSIS — Z8673 Personal history of transient ischemic attack (TIA), and cerebral infarction without residual deficits: Secondary | ICD-10-CM

## 2021-02-02 DIAGNOSIS — Z885 Allergy status to narcotic agent status: Secondary | ICD-10-CM

## 2021-02-02 DIAGNOSIS — A419 Sepsis, unspecified organism: Secondary | ICD-10-CM | POA: Diagnosis present

## 2021-02-02 DIAGNOSIS — Z87440 Personal history of urinary (tract) infections: Secondary | ICD-10-CM

## 2021-02-02 DIAGNOSIS — C50919 Malignant neoplasm of unspecified site of unspecified female breast: Secondary | ICD-10-CM | POA: Diagnosis present

## 2021-02-02 DIAGNOSIS — Z887 Allergy status to serum and vaccine status: Secondary | ICD-10-CM

## 2021-02-02 DIAGNOSIS — I10 Essential (primary) hypertension: Secondary | ICD-10-CM | POA: Diagnosis present

## 2021-02-02 DIAGNOSIS — N390 Urinary tract infection, site not specified: Secondary | ICD-10-CM | POA: Diagnosis present

## 2021-02-02 DIAGNOSIS — N3000 Acute cystitis without hematuria: Secondary | ICD-10-CM

## 2021-02-02 DIAGNOSIS — F0390 Unspecified dementia without behavioral disturbance: Secondary | ICD-10-CM | POA: Diagnosis present

## 2021-02-02 DIAGNOSIS — Z20822 Contact with and (suspected) exposure to covid-19: Secondary | ICD-10-CM | POA: Diagnosis present

## 2021-02-02 MED ORDER — ACETAMINOPHEN 500 MG PO TABS
1000.0000 mg | ORAL_TABLET | Freq: Once | ORAL | Status: AC
  Administered 2021-02-02: 1000 mg via ORAL
  Filled 2021-02-02: qty 2

## 2021-02-02 MED ORDER — ACETAMINOPHEN 160 MG/5ML PO SOLN: 650.0000 mg | Freq: Once | ORAL | Status: DC

## 2021-02-02 NOTE — ED Triage Notes (Addendum)
Family reports decreased mental status , fever and runny nose x  3 hrs , received pneumonia vaccine in am

## 2021-02-03 ENCOUNTER — Encounter (HOSPITAL_BASED_OUTPATIENT_CLINIC_OR_DEPARTMENT_OTHER): Payer: Self-pay | Admitting: Emergency Medicine

## 2021-02-03 DIAGNOSIS — Z20822 Contact with and (suspected) exposure to covid-19: Secondary | ICD-10-CM | POA: Diagnosis present

## 2021-02-03 DIAGNOSIS — N390 Urinary tract infection, site not specified: Secondary | ICD-10-CM | POA: Diagnosis present

## 2021-02-03 DIAGNOSIS — I10 Essential (primary) hypertension: Secondary | ICD-10-CM | POA: Diagnosis present

## 2021-02-03 DIAGNOSIS — Z79811 Long term (current) use of aromatase inhibitors: Secondary | ICD-10-CM | POA: Diagnosis not present

## 2021-02-03 DIAGNOSIS — Z8673 Personal history of transient ischemic attack (TIA), and cerebral infarction without residual deficits: Secondary | ICD-10-CM | POA: Diagnosis not present

## 2021-02-03 DIAGNOSIS — A419 Sepsis, unspecified organism: Secondary | ICD-10-CM | POA: Diagnosis present

## 2021-02-03 DIAGNOSIS — G9340 Encephalopathy, unspecified: Secondary | ICD-10-CM | POA: Diagnosis present

## 2021-02-03 DIAGNOSIS — Z885 Allergy status to narcotic agent status: Secondary | ICD-10-CM | POA: Diagnosis not present

## 2021-02-03 DIAGNOSIS — Z79899 Other long term (current) drug therapy: Secondary | ICD-10-CM | POA: Diagnosis not present

## 2021-02-03 DIAGNOSIS — Z887 Allergy status to serum and vaccine status: Secondary | ICD-10-CM | POA: Diagnosis not present

## 2021-02-03 DIAGNOSIS — C50919 Malignant neoplasm of unspecified site of unspecified female breast: Secondary | ICD-10-CM | POA: Diagnosis present

## 2021-02-03 DIAGNOSIS — E872 Acidosis, unspecified: Secondary | ICD-10-CM | POA: Diagnosis present

## 2021-02-03 DIAGNOSIS — F0390 Unspecified dementia without behavioral disturbance: Secondary | ICD-10-CM | POA: Diagnosis present

## 2021-02-03 DIAGNOSIS — Z87440 Personal history of urinary (tract) infections: Secondary | ICD-10-CM | POA: Diagnosis not present

## 2021-02-03 DIAGNOSIS — G9341 Metabolic encephalopathy: Secondary | ICD-10-CM | POA: Diagnosis present

## 2021-02-03 LAB — URINALYSIS, ROUTINE W REFLEX MICROSCOPIC
Bilirubin Urine: NEGATIVE
Glucose, UA: NEGATIVE mg/dL
Hgb urine dipstick: NEGATIVE
Ketones, ur: NEGATIVE mg/dL
Nitrite: NEGATIVE
Protein, ur: NEGATIVE mg/dL
Specific Gravity, Urine: 1.025 (ref 1.005–1.030)
pH: 5.5 (ref 5.0–8.0)

## 2021-02-03 LAB — CBC WITH DIFFERENTIAL/PLATELET
Abs Immature Granulocytes: 0.09 10*3/uL — ABNORMAL HIGH (ref 0.00–0.07)
Basophils Absolute: 0 10*3/uL (ref 0.0–0.1)
Basophils Relative: 0 %
Eosinophils Absolute: 0 10*3/uL (ref 0.0–0.5)
Eosinophils Relative: 0 %
HCT: 38.3 % (ref 36.0–46.0)
Hemoglobin: 12.4 g/dL (ref 12.0–15.0)
Immature Granulocytes: 1 %
Lymphocytes Relative: 1 %
Lymphs Abs: 0.1 10*3/uL — ABNORMAL LOW (ref 0.7–4.0)
MCH: 29.6 pg (ref 26.0–34.0)
MCHC: 32.4 g/dL (ref 30.0–36.0)
MCV: 91.4 fL (ref 80.0–100.0)
Monocytes Absolute: 0.4 10*3/uL (ref 0.1–1.0)
Monocytes Relative: 3 %
Neutro Abs: 14.3 10*3/uL — ABNORMAL HIGH (ref 1.7–7.7)
Neutrophils Relative %: 95 %
Platelets: 214 10*3/uL (ref 150–400)
RBC: 4.19 MIL/uL (ref 3.87–5.11)
RDW: 13.2 % (ref 11.5–15.5)
WBC: 15 10*3/uL — ABNORMAL HIGH (ref 4.0–10.5)
nRBC: 0 % (ref 0.0–0.2)

## 2021-02-03 LAB — COMPREHENSIVE METABOLIC PANEL
ALT: 15 U/L (ref 0–44)
AST: 26 U/L (ref 15–41)
Albumin: 3.6 g/dL (ref 3.5–5.0)
Alkaline Phosphatase: 92 U/L (ref 38–126)
Anion gap: 10 (ref 5–15)
BUN: 16 mg/dL (ref 8–23)
CO2: 23 mmol/L (ref 22–32)
Calcium: 9.9 mg/dL (ref 8.9–10.3)
Chloride: 103 mmol/L (ref 98–111)
Creatinine, Ser: 0.8 mg/dL (ref 0.44–1.00)
GFR, Estimated: >60 mL/min (ref >60)
Glucose, Bld: 115 mg/dL — ABNORMAL HIGH (ref 70–99)
Potassium: 4.1 mmol/L (ref 3.5–5.1)
Sodium: 136 mmol/L (ref 135–145)
Total Bilirubin: 0.6 mg/dL (ref 0.3–1.2)
Total Protein: 7 g/dL (ref 6.5–8.1)

## 2021-02-03 LAB — URINALYSIS, MICROSCOPIC (REFLEX)

## 2021-02-03 LAB — LACTIC ACID, PLASMA
Lactic Acid, Venous: 2.1 mmol/L (ref 0.5–1.9)
Lactic Acid, Venous: 2.1 mmol/L (ref 0.5–1.9)

## 2021-02-03 LAB — MAGNESIUM: Magnesium: 1.6 mg/dL — ABNORMAL LOW (ref 1.7–2.4)

## 2021-02-03 LAB — PROCALCITONIN: Procalcitonin: 1.09 ng/mL

## 2021-02-03 LAB — RESP PANEL BY RT-PCR (FLU A&B, COVID) ARPGX2
Influenza A by PCR: NEGATIVE
Influenza B by PCR: NEGATIVE
SARS Coronavirus 2 by RT PCR: NEGATIVE

## 2021-02-03 MED ORDER — SODIUM CHLORIDE 0.45 % IV SOLN: INTRAVENOUS | Status: DC

## 2021-02-03 MED ORDER — MAGNESIUM SULFATE 2 GM/50ML IV SOLN
2.0000 g | Freq: Once | INTRAVENOUS | Status: AC
  Administered 2021-02-03: 2 g via INTRAVENOUS
  Filled 2021-02-03: qty 50

## 2021-02-03 MED ORDER — METOPROLOL TARTRATE 12.5 MG HALF TABLET
12.5000 mg | ORAL_TABLET | Freq: Two times a day (BID) | ORAL | Status: DC
  Administered 2021-02-03 (×2): 12.5 mg via ORAL
  Filled 2021-02-03 (×6): qty 1

## 2021-02-03 MED ORDER — LACTATED RINGERS IV BOLUS
1000.0000 mL | Freq: Once | INTRAVENOUS | Status: AC
  Administered 2021-02-03: 1000 mL via INTRAVENOUS

## 2021-02-03 MED ORDER — HYDRALAZINE HCL 25 MG PO TABS
25.0000 mg | ORAL_TABLET | Freq: Four times a day (QID) | ORAL | Status: DC | PRN
  Administered 2021-02-03 – 2021-02-04 (×2): 25 mg via ORAL
  Filled 2021-02-03 (×2): qty 1

## 2021-02-03 MED ORDER — ENOXAPARIN SODIUM 40 MG/0.4ML IJ SOSY
40.0000 mg | PREFILLED_SYRINGE | Freq: Every day | INTRAMUSCULAR | Status: DC
  Administered 2021-02-03 – 2021-02-05 (×3): 40 mg via SUBCUTANEOUS
  Filled 2021-02-03 (×3): qty 0.4

## 2021-02-03 MED ORDER — SODIUM CHLORIDE 0.9 % IV SOLN
1.0000 g | INTRAVENOUS | Status: DC
  Administered 2021-02-04 – 2021-02-05 (×2): 1 g via INTRAVENOUS
  Filled 2021-02-03 (×2): qty 10

## 2021-02-03 MED ORDER — SODIUM CHLORIDE 0.9 % IV SOLN: INTRAVENOUS | Status: DC

## 2021-02-03 MED ORDER — ONDANSETRON HCL 4 MG PO TABS: 4.0000 mg | ORAL_TABLET | Freq: Four times a day (QID) | ORAL | Status: DC | PRN

## 2021-02-03 MED ORDER — ONDANSETRON HCL 4 MG/2ML IJ SOLN: 4.0000 mg | Freq: Four times a day (QID) | INTRAMUSCULAR | Status: DC | PRN

## 2021-02-03 MED ORDER — SODIUM CHLORIDE 0.9 % IV SOLN
1.0000 g | Freq: Once | INTRAVENOUS | Status: AC
  Administered 2021-02-03: 1 g via INTRAVENOUS
  Filled 2021-02-03 (×2): qty 10

## 2021-02-03 MED ORDER — AMLODIPINE BESYLATE 5 MG PO TABS
5.0000 mg | ORAL_TABLET | Freq: Every day | ORAL | Status: DC
  Administered 2021-02-03 – 2021-02-05 (×3): 5 mg via ORAL
  Filled 2021-02-03 (×4): qty 1

## 2021-02-03 MED ORDER — ACETAMINOPHEN 650 MG RE SUPP: 650.0000 mg | Freq: Four times a day (QID) | RECTAL | Status: DC | PRN

## 2021-02-03 MED ORDER — ACETAMINOPHEN 325 MG PO TABS
650.0000 mg | ORAL_TABLET | Freq: Four times a day (QID) | ORAL | Status: DC | PRN
  Administered 2021-02-03 (×2): 650 mg via ORAL
  Filled 2021-02-03 (×2): qty 2

## 2021-02-03 NOTE — ED Provider Notes (Signed)
[facility] Provider Note   CSN: [id number] Arrival date & time: 02/02/21  2252     History Chief Complaint  Patient presents with   Altered Mental Status   Fever    Olivia Ross is a 75 y.o. female.  The history is provided by the patient and a relative. The history is limited by the condition of the patient (level 5 caveat dementia).  Altered Mental Status Presenting symptoms: confusion, disorientation and memory loss   Severity:  Moderate Most recent episode:  Yesterday Episode history:  Single Duration:  1 day Timing:  Constant Progression:  Worsening Chronicity:  New Context: dementia   Context: not alcohol use, not drug use, not head injury and not homeless   Associated symptoms: fever   Associated symptoms: no agitation, no difficulty breathing and no vomiting   Associated symptoms comment:  Urinary symptoms and rhinorrhea that started today.       Past Medical History:  Diagnosis Date   Cancer [facility])    Hypertension     Patient Active Problem List   Diagnosis Date Noted   E coli bacteremia 12/21/2020   Acute encephalopathy 12/20/2020   Encephalopathy 12/20/2020   Generalized weakness    Acute cystitis without hematuria    Disorientation     History reviewed. No pertinent surgical history.   OB History   No obstetric history on file.     History reviewed. No pertinent family history.  Social History   Tobacco Use   Smoking status: Never   Smokeless tobacco: Never  Substance Use Topics   Alcohol use: Never   Drug use: Never    Home Medications Prior to Admission medications   Medication Sig Start Date End Date Taking? Authorizing Provider  acetaminophen (TYLENOL) 500 MG tablet Take 500 mg by mouth every 6 (six) hours as needed for moderate pain.    [provider]  amLODipine (NORVASC) 5 MG tablet Take 5 mg by mouth daily as needed (if blodd pressure is over 120). 11/02/20   [provider]   amoxicillin-clavulanate (AUGMENTIN) 875-125 MG tablet Take 1 tablet by mouth 2 (two) times daily. One po bid x 7 days 01/24/21   Charlesetta Shanks, MD  carboxymethylcellulose (REFRESH PLUS) 0.5 % SOLN Place 1 drop into both eyes 3 (three) times daily as needed (dry eyes).    [provider]  dicyclomine (BENTYL) 10 MG capsule Take 10 mg by mouth daily as needed for spasms. 07/24/20   [provider]  donepezil (ARICEPT) 10 MG tablet Take 10 mg by mouth at bedtime. 05/02/20   [provider]  letrozole (FEMARA) 2.5 MG tablet Take 2.5 mg by mouth daily. 12/12/20   [provider]  magnesium oxide (MAG-OX) 400 (240 Mg) MG tablet Take 0.5 tablets (200 mg total) by mouth 2 (two) times daily. 12/22/20   Regalado, Belkys A, MD  metoprolol tartrate (LOPRESSOR) 25 MG tablet Take 0.5 tablets (12.5 mg total) by mouth 2 (two) times daily. 12/22/20   Regalado, Belkys A, MD  Multiple Vitamin (MULTIVITAMIN WITH MINERALS) TABS tablet Take 1 tablet by mouth daily. Centrum    [provider]  valsartan (DIOVAN) 160 MG tablet Take 160 mg by mouth daily. 12/12/20   [provider]  vitamin B-12 100 MCG tablet Take 1 tablet (100 mcg total) by mouth daily. 12/23/20   Regalado, Cassie Freer, MD    Allergies    Diphth-acell pertussis-tetanus, Dtap-hepatitis b recomb-ipv, Norco [hydrocodone-acetaminophen], and Percocet [oxycodone-acetaminophen]  Review of Systems   Review of Systems  Constitutional:  Positive for fever.  HENT:  Positive for rhinorrhea.   Eyes:  Negative for redness.  Respiratory:  Negative for wheezing and stridor.   Cardiovascular:  Negative for leg swelling.  Gastrointestinal:  Negative for vomiting.  Genitourinary:  Positive for dysuria. Negative for difficulty urinating.  Musculoskeletal:  Negative for neck stiffness.  Neurological:  Negative for facial asymmetry.  Psychiatric/Behavioral:  Positive for confusion and memory loss. Negative for agitation.    All other systems reviewed and are negative.  Physical Exam Updated Vital Signs BP (!) 103/50 (BP Location: Right Arm)   Pulse 75   Temp 99.2 F (37.3 C) (Oral)   Resp 15   SpO2 100%   Physical Exam Vitals and nursing note reviewed.  Constitutional:      Appearance: Normal appearance. She is not diaphoretic.  HENT:     Head: Normocephalic and atraumatic.     Nose: Nose normal.  Eyes:     Conjunctiva/sclera: Conjunctivae normal.     Pupils: Pupils are equal, round, and reactive to light.  Cardiovascular:     Rate and Rhythm: Normal rate and regular rhythm.     Pulses: Normal pulses.     Heart sounds: Normal heart sounds.  Pulmonary:     Effort: Pulmonary effort is normal.     Breath sounds: Normal breath sounds.  Abdominal:     General: Abdomen is flat. Bowel sounds are normal.     Palpations: Abdomen is soft.     Tenderness: There is no abdominal tenderness. There is no guarding.  Musculoskeletal:        General: Normal range of motion.     Cervical back: Normal range of motion and neck supple.     Right lower leg: No edema.     Left lower leg: No edema.  Skin:    General: Skin is warm and dry.     Capillary Refill: Capillary refill takes less than 2 seconds.     Findings: No erythema.  Neurological:     General: No focal deficit present.     Mental Status: She is alert.     Deep Tendon Reflexes: Reflexes normal.  Psychiatric:        Mood and Affect: Mood normal.    ED Results / Procedures / Treatments   Labs (all labs ordered are listed, but only abnormal results are displayed) Results for orders placed or performed during the hospital encounter of 02/02/21  Resp Panel by RT-PCR (Flu A&B, Covid) Nasopharyngeal Swab   Specimen: Nasopharyngeal Swab; Nasopharyngeal(NP) swabs in vial transport medium  Result Value Ref Range   SARS Coronavirus 2 by RT PCR NEGATIVE NEGATIVE   Influenza A by PCR NEGATIVE NEGATIVE   Influenza B by PCR NEGATIVE NEGATIVE  CBC with  Differential  Result Value Ref Range   WBC 15.0 (H) 4.0 - 10.5 K/uL   RBC 4.19 3.87 - 5.11 MIL/uL   Hemoglobin 12.4 12.0 - 15.0 g/dL   HCT 38.3 36.0 - 46.0 %   MCV 91.4 80.0 - 100.0 fL   MCH 29.6 26.0 - 34.0 pg   MCHC 32.4 30.0 - 36.0 g/dL   RDW 13.2 11.5 - 15.5 %   Platelets 214 150 - 400 K/uL   nRBC 0.0 0.0 - 0.2 %   Neutrophils Relative % 95 %   Neutro Abs 14.3 (H) 1.7 - 7.7 K/uL   Lymphocytes Relative 1 %   Lymphs Abs  0.1 (L) 0.7 - 4.0 K/uL   Monocytes Relative 3 %   Monocytes Absolute 0.4 0.1 - 1.0 K/uL   Eosinophils Relative 0 %   Eosinophils Absolute 0.0 0.0 - 0.5 K/uL   Basophils Relative 0 %   Basophils Absolute 0.0 0.0 - 0.1 K/uL   Immature Granulocytes 1 %   Abs Immature Granulocytes 0.09 (H) 0.00 - 0.07 K/uL  Comprehensive metabolic panel  Result Value Ref Range   Sodium 136 135 - 145 mmol/L   Potassium 4.1 3.5 - 5.1 mmol/L   Chloride 103 98 - 111 mmol/L   CO2 23 22 - 32 mmol/L   Glucose, Bld 115 (H) 70 - 99 mg/dL   BUN 16 8 - 23 mg/dL   Creatinine, Ser 0.80 0.44 - 1.00 mg/dL   Calcium 9.9 8.9 - 10.3 mg/dL   Total Protein 7.0 6.5 - 8.1 g/dL   Albumin 3.6 3.5 - 5.0 g/dL   AST 26 15 - 41 U/L   ALT 15 0 - 44 U/L   Alkaline Phosphatase 92 38 - 126 U/L   Total Bilirubin 0.6 0.3 - 1.2 mg/dL   GFR, Estimated >60 >60 mL/min   Anion gap 10 5 - 15  Urinalysis, Routine w reflex microscopic Urine, Clean Catch  Result Value Ref Range   Color, Urine YELLOW YELLOW   APPearance HAZY (A) CLEAR   Specific Gravity, Urine 1.025 1.005 - 1.030   pH 5.5 5.0 - 8.0   Glucose, UA NEGATIVE NEGATIVE mg/dL   Hgb urine dipstick NEGATIVE NEGATIVE   Bilirubin Urine NEGATIVE NEGATIVE   Ketones, ur NEGATIVE NEGATIVE mg/dL   Protein, ur NEGATIVE NEGATIVE mg/dL   Nitrite NEGATIVE NEGATIVE   Leukocytes,Ua TRACE (A) NEGATIVE  Urinalysis, Microscopic (reflex)  Result Value Ref Range   RBC / HPF 0-5 0 - 5 RBC/hpf   WBC, UA 6-10 0 - 5 WBC/hpf   Bacteria, UA RARE (A) NONE SEEN    Squamous Epithelial / LPF 0-5 0 - 5   Mucus PRESENT    Hyaline Casts, UA PRESENT    CT Head Wo Contrast  Result Date: 02/03/2021 CLINICAL DATA:  Trauma. EXAM: CT HEAD WITHOUT CONTRAST TECHNIQUE: Contiguous axial images were obtained from the base of the skull through the vertex without intravenous contrast. COMPARISON:  Head CT dated 01/23/2021. FINDINGS: Brain: Moderate age-related atrophy and chronic microvascular ischemic changes. Large area of old infarct and encephalomalacia involving the right MCA territory as well as areas of old infarct in the left frontal lobe and bilateral cerebellar hemispheres. Small bilateral basal ganglia old lacunar infarcts. There is no acute intracranial hemorrhage. No mass effect or midline shift. No extra-axial fluid collection. Vascular: No hyperdense vessel or unexpected calcification. Skull: Normal. Negative for fracture or focal lesion. Sinuses/Orbits: No acute finding. Other: None IMPRESSION: 1. No acute intracranial pathology. 2. Moderate age-related atrophy and chronic microvascular ischemic changes. Multiple old infarcts as described. Electronically Signed   By: Anner Crete M.D.   On: 02/03/2021 00:38   CT Head Wo Contrast  Result Date: 01/23/2021 CLINICAL DATA:  Mental status change EXAM: CT HEAD WITHOUT CONTRAST TECHNIQUE: Contiguous axial images were obtained from the base of the skull through the vertex without intravenous contrast. COMPARISON:  CT 12/20/2020, 07/21/2020 FINDINGS: Brain: No acute territorial infarction, hemorrhage or intracranial mass. 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 Chronic left frontal lobe infarct. Multiple chronic lacunar infarcts in the basal ganglia. Multiple small chronic cerebellar  infarcts. Hypodensity within the pons on sagittal view, suspect related to artifact. There may be chronic small vessel ischemic change in the pons. Stable ventricle size. Atrophy and  chronic small vessel ischemic changes of the white matter. Vascular: No hyperdense vessels. Carotid vascular calcification. Small of gas in the cavernous sinus possibly related to line placement. Skull: Normal. Negative for fracture or focal lesion. Sinuses/Orbits: No acute finding. Other: None IMPRESSION: 1. No definite CT evidence for acute intracranial abnormality. 2. Atrophy and chronic small vessel ischemic changes of the white matter. Multiple chronic bilateral infarcts including large chronic right MCA infarct. Hypodensity in the pons is suspected to be due to combination of artifact and small vessel disease. Electronically Signed   By: Donavan Foil M.D.   On: 01/23/2021 22:32   DG Chest Portable 1 View  Result Date: 02/03/2021 CLINICAL DATA:  Altered mental status. EXAM: PORTABLE CHEST 1 VIEW COMPARISON:  Portable chest 01/23/2021 FINDINGS: The heart size and mediastinal contours are within normal limits. Patchy calcification again noted aortic arch. Both lungs are clear. The visualized skeletal structures are unremarkable. Again noted are surgical clips in the right axilla and left upper abdomen. IMPRESSION: No evidence of acute chest disease  or interval changes. Electronically Signed   By: Telford Nab M.D.   On: 02/03/2021 00:33   DG Chest Port 1 View  Result Date: 01/23/2021 CLINICAL DATA:  Generalized weakness EXAM: PORTABLE CHEST 1 VIEW COMPARISON:  12/19/2020 FINDINGS: Heart and mediastinal contours are within normal limits. No focal opacities or effusions. No acute bony abnormality. IMPRESSION: No active disease. Electronically Signed   By: Rolm Baptise M.D.   On: 01/23/2021 22:16    EKG EKG Interpretation  Date/Time:  Friday February 02 2021 23:13:19 EDT Ventricular Rate:  75 PR Interval:  129 QRS Duration: 72 QT Interval:  355 QTC Calculation: 397 R Axis:   59 Text Interpretation: Sinus rhythm Confirmed by Dory Horn) on 02/02/2021 11:21:35 PM  Radiology CT  Head Wo Contrast  Result Date: 02/03/2021 CLINICAL DATA:  Trauma. EXAM: CT HEAD WITHOUT CONTRAST TECHNIQUE: Contiguous axial images were obtained from the base of the skull through the vertex without intravenous contrast. COMPARISON:  Head CT dated 01/23/2021. FINDINGS: Brain: Moderate age-related atrophy and chronic microvascular ischemic changes. Large area of old infarct and encephalomalacia involving the right MCA territory as well as areas of old infarct in the left frontal lobe and bilateral cerebellar hemispheres. Small bilateral basal ganglia old lacunar infarcts. There is no acute intracranial hemorrhage. No mass effect or midline shift. No extra-axial fluid collection. Vascular: No hyperdense vessel or unexpected calcification. Skull: Normal. Negative for fracture or focal lesion. Sinuses/Orbits: No acute finding. Other: None IMPRESSION: 1. No acute intracranial pathology. 2. Moderate age-related atrophy and chronic microvascular ischemic changes. Multiple old infarcts as described. Electronically Signed   By: Anner Crete M.D.   On: 02/03/2021 00:38   DG Chest Portable 1 View  Result Date: 02/03/2021 CLINICAL DATA:  Altered mental status. EXAM: PORTABLE CHEST 1 VIEW COMPARISON:  Portable chest 01/23/2021 FINDINGS: The heart size and mediastinal contours are within normal limits. Patchy calcification again noted aortic arch. Both lungs are clear. The visualized skeletal structures are unremarkable. Again noted are surgical clips in the right axilla and left upper abdomen. IMPRESSION: No evidence of acute chest disease  or interval changes. Electronically Signed   By: Telford Nab M.D.   On: 02/03/2021 00:33    Procedures Procedures   Medications Ordered in ED  Medications  acetaminophen (TYLENOL) 160 MG/5ML solution 650 mg (650 mg Oral Not Given 02/03/21 0041)  0.9 %  sodium chloride infusion (has no administration in time range)  cefTRIAXone (ROCEPHIN) 1 g in sodium chloride 0.9 % 100  mL IVPB (has no administration in time range)  acetaminophen (TYLENOL) tablet 1,000 mg (1,000 mg Oral Given 02/02/21 2353)    ED Course  I have reviewed the triage vital signs and the nursing notes.  Pertinent labs & imaging results that were available during my care of the patient were reviewed by me and considered in my medical decision making (see chart for details).    Ruble Buttler was evaluated in Emergency Department on 02/03/2021 for the symptoms described in the history of present illness. She was evaluated in the context of the global COVID-19 pandemic, which necessitated consideration that the patient might be at risk for infection with the SARS-CoV-2 virus that causes COVID-19. Institutional protocols and algorithms that pertain to the evaluation of patients at risk for COVID-19 are in a state of rapid change based on information released by regulatory bodies including the CDC and federal and state organizations. These policies and algorithms were followed during the patient's care in the ED.  Final Clinical Impression(s) / ED Diagnoses Final diagnoses:  Urinary tract infection without hematuria, site unspecified   Admit to medicine for infection with AMS.   Rx / DC Orders ED Discharge Orders     None        Bettyanne Dittman, MD 02/03/21 0518

## 2021-02-03 NOTE — Progress Notes (Signed)
Notified admitting MD of patient's arrival from [place].

## 2021-02-03 NOTE — Progress Notes (Signed)
Notified [facility] Admitting and Consults that pt just arrived from [place].

## 2021-02-03 NOTE — ED Notes (Signed)
Pt was incontinent of urine. Provided bedside care, full linen change, and applied Purewick and clean brief.

## 2021-02-03 NOTE — Progress Notes (Signed)
Patient: Olivia Ross, Olivia Ross  DOB: 2045/04/29 [id number]   Transferring facility: [facility] Requesting provider: Dr. Randal Buba (EDP at [facility]) Reason for transfer: admission for further evaluation and management of acute encephalopathy   75 year old female with history of underlying dementia, recurrent urinary tract infections, who presented to [place] ED on 02/03/2021 for evaluation of 1 day of objective fever, rhinorrhea, generalized myalgias.  Daughter also reported that the patient has exhibited 1 day of worsening confusion superimposed on her baseline dementia.  Patient also has a history of recurrent urinary tract infections, and appears to have most recently been diagnosed with UTI on 01/23/2021, completing interval antibiotic course as outpatient, but noting continued presence of dysuria per evaluation at [facility] today.  Reportedly denies any chest pain, shortness of breath, cough.  Objective fever of 102.4 at [facility] today, with CBC notable for wbc of 15,000 compared to 14,500 on 10/25.  COVID-19/influenza negative.  Chest x-ray reportedly showed no acute cardiopulmonary process.  Urinalysis showed 6-10 white blood cells.   Presentation felt to be consistent with acute encephalopathy in the setting of concern for severe sepsis due to potential viral upper respiratory infection versus incompletely treated urinary tract infection given perpetuation of dysuria, subjective fever and leukocytosis.  Blood cultures collected.  Started on Rocephin.  Lactic acid pending.   Subsequently, I accepted this patient for transfer for admission to a MedSurg bed at either [facility] or [facility] (first available) for further work-up and management of suspected acute encephalopathy, as above.     Babs Bertin, DO Hospitalist

## 2021-02-03 NOTE — ED Notes (Signed)
Obtained signature to consent to transfer. Printed paperwork to prepare for xfer to [employer].

## 2021-02-03 NOTE — ED Notes (Signed)
Notified provider of elevated lactic acid 2.1

## 2021-02-03 NOTE — H&P (Signed)
History and Physical    Olivia Ross WCH:[id number] DOB: Jan 16, 1946 DOA: 02/02/2021  PCP: Jolinda Croak, MD   Patient coming from: Home.  I have personally briefly reviewed patient's old medical records in [place]  Chief Complaint: Altered mental status.  HPI: Olivia Ross is a 75 y.o. female with medical history significant of breast cancer, history of other known hemorrhagic CVA, hypertension, dementia without behavioral disturbance, history of E. coli bacteremia in September 2022 who was taken to the emergency department after at [facility] due to altered mental status.  The patient is oriented to name, she knows she is in the hospital but thought she was at [facility].  She gets disoriented to time, date and situation.  She is able to answer simple questions and denied headache, sore throat, dyspnea, joint, muscle, abdominal back or chest pain.  ED Course: 102.4 F, pulse 87, respiration 18, BP 170/65 mmHg O2 sat 100% on room air.  The patient received a 1000 mL LR bolus, acetaminophen 1000 mg orally and ceftriaxone 1 g IVPB.  Lab work: Her urinalysis was hazy with trace leukocyte esterase and rare bacteria.  WBC were 6-10.  Coronavirus and influenza PCR was negative.  CBC showed a white count of 15.0 with 95% neutrophils, hemoglobin 12.4 g/dL and platelets 214.  Lactic acid was 2.1 mmol/L twice.  CMP showed a glucose of 115 mg/dL but all other measurements were unremarkable.  Imaging: A portable 1 view chest radiograph did not show any acute cardiopulmonary disease.  CT head without contrast showed moderate age-related atrophy and chronic microvascular ischemic changes with multiple old infarcts but no acute intracranial pathology.  Please see images and full radiology report for further details.  Review of Systems: As per HPI otherwise all other systems reviewed and are negative.  Past Medical History:  Diagnosis Date   Cancer [facility])    Hypertension    History reviewed. No  pertinent surgical history.  Social History  reports that she has never smoked. She has never used smokeless tobacco. She reports that she does not drink alcohol and does not use drugs.  Allergies  Allergen Reactions   Diphth-Acell Pertussis-Tetanus     Other reaction(s): Other (See Comments)   Dtap-Hepatitis B Recomb-Ipv Other (See Comments)    unknown   Norco [Hydrocodone-Acetaminophen] Nausea And Vomiting   Percocet [Oxycodone-Acetaminophen] Nausea And Vomiting   History reviewed. No pertinent family history.  Prior to Admission medications   Medication Sig Start Date End Date Taking? Authorizing Provider  acetaminophen (TYLENOL) 500 MG tablet Take 500 mg by mouth every 6 (six) hours as needed for moderate pain.   Yes [provider]  amLODipine (NORVASC) 5 MG tablet Take 5 mg by mouth daily as needed (if blodd pressure is over 120). 11/02/20  Yes [provider]  amoxicillin-clavulanate (AUGMENTIN) 875-125 MG tablet Take 1 tablet by mouth 2 (two) times daily. One po bid x 7 days 01/24/21  Yes Pfeiffer, Jeannie Done, MD  carboxymethylcellulose (REFRESH PLUS) 0.5 % SOLN Place 1 drop into both eyes 3 (three) times daily as needed (dry eyes).   Yes [provider]  dicyclomine (BENTYL) 10 MG capsule Take 10 mg by mouth daily as needed for spasms. 07/24/20  Yes [provider]  letrozole (FEMARA) 2.5 MG tablet Take 2.5 mg by mouth daily. 12/12/20  Yes [provider]  magnesium oxide (MAG-OX) 400 (240 Mg) MG tablet Take 0.5 tablets (200 mg total) by mouth 2 (two) times daily. 12/22/20  Yes  Regalado, Belkys A, MD  metoprolol tartrate (LOPRESSOR) 25 MG tablet Take 0.5 tablets (12.5 mg total) by mouth 2 (two) times daily. 12/22/20  Yes Regalado, Belkys A, MD  Multiple Vitamin (MULTIVITAMIN WITH MINERALS) TABS tablet Take 1 tablet by mouth daily. Centrum   Yes [provider]  valsartan (DIOVAN) 160 MG tablet Take 160 mg by mouth daily. 12/12/20  Yes  [provider]  vitamin B-12 100 MCG tablet Take 1 tablet (100 mcg total) by mouth daily. 12/23/20  Yes Elmarie Shiley, MD   Physical Exam: Vitals:   02/03/21 0900 02/03/21 1038 02/03/21 1106 02/03/21 1158  BP: (!) 174/55 (!) 175/54  (!) 143/51  Pulse: 74 73  (!) 103  Resp: _0 Temp:  (!) 101.1 F (38.4 C)  99.5 F (37.5 C)  TempSrc:  Oral  Oral  SpO2: 100% 100%  100%  Weight:   65.1 kg   Height:   _1  (1.651 m)    Constitutional: NAD, calm, comfortable Eyes: PERRL, lids and conjunctivae normal ENMT: Mucous membranes are mildly dry. Posterior pharynx clear of any exudate or lesions. Neck: normal, supple, no masses, no thyromegaly Respiratory: clear to auscultation bilaterally, no wheezing, no crackles. Normal respiratory effort. No accessory muscle use.  Cardiovascular: Regular rate and rhythm, no murmurs / rubs / gallops. No extremity edema. 2+ pedal pulses. No carotid bruits.  Abdomen: No distention.  Bowel sounds positive. Soft, no tenderness, no masses palpated. No hepatosplenomegaly.  Musculoskeletal: Moderate generalized weakness.  No clubbing / cyanosis.  Good ROM, no contractures. Normal muscle tone.  Skin: no acute rashes, lesions, ulcers on limited dermatological examination. Neurologic: CN 2-12 grossly intact. Sensation intact, DTR normal. Strength seems symmetric. Psychiatric: Awake, alert oriented x1.  Labs on Admission: I have personally reviewed following labs and imaging studies  CBC: Recent Labs  Lab 02/03/21 0105  WBC 15.0*  NEUTROABS 14.3*  HGB 12.4  HCT 38.3  MCV 91.4  PLT 811   Basic Metabolic Panel: Recent Labs  Lab 02/03/21 0105  NA 136  K 4.1  CL 103  CO2 23  GLUCOSE 115*  BUN 16  CREATININE 0.80  CALCIUM 9.9   GFR: Estimated Creatinine Clearance: 53 mL/min (by C-G formula based on SCr of 0.8 mg/dL).  Liver Function Tests: Recent Labs  Lab 02/03/21 0105  AST 26  ALT 15  ALKPHOS 92  BILITOT 0.6  PROT 7.0   ALBUMIN 3.6    Urine analysis:    Component Value Date/Time   COLORURINE YELLOW 02/02/2021 2346   APPEARANCEUR HAZY (A) 02/02/2021 2346   LABSPEC 1.025 02/02/2021 2346   PHURINE 5.5 02/02/2021 2346   GLUCOSEU NEGATIVE 02/02/2021 2346   HGBUR NEGATIVE 02/02/2021 2346   BILIRUBINUR NEGATIVE 02/02/2021 2346   KETONESUR NEGATIVE 02/02/2021 2346   PROTEINUR NEGATIVE 02/02/2021 2346   NITRITE NEGATIVE 02/02/2021 2346   LEUKOCYTESUR TRACE (A) 02/02/2021 2346   Radiological Exams on Admission: CT Head Wo Contrast  Result Date: 02/03/2021 CLINICAL DATA:  Trauma. EXAM: CT HEAD WITHOUT CONTRAST TECHNIQUE: Contiguous axial images were obtained from the base of the skull through the vertex without intravenous contrast. COMPARISON:  Head CT dated 01/23/2021. FINDINGS: Brain: Moderate age-related atrophy and chronic microvascular ischemic changes. Large area of old infarct and encephalomalacia involving the right MCA territory as well as areas of old infarct in the left frontal lobe and bilateral cerebellar hemispheres. Small bilateral basal ganglia old lacunar infarcts. There is no acute intracranial hemorrhage. No  mass effect or midline shift. No extra-axial fluid collection. Vascular: No hyperdense vessel or unexpected calcification. Skull: Normal. Negative for fracture or focal lesion. Sinuses/Orbits: No acute finding. Other: None IMPRESSION: 1. No acute intracranial pathology. 2. Moderate age-related atrophy and chronic microvascular ischemic changes. Multiple old infarcts as described. Electronically Signed   By: Anner Crete M.D.   On: 02/03/2021 00:38   DG Chest Portable 1 View  Result Date: 02/03/2021 CLINICAL DATA:  Altered mental status. EXAM: PORTABLE CHEST 1 VIEW COMPARISON:  Portable chest 01/23/2021 FINDINGS: The heart size and mediastinal contours are within normal limits. Patchy calcification again noted aortic arch. Both lungs are clear. The visualized skeletal structures are  unremarkable. Again noted are surgical clips in the right axilla and left upper abdomen. IMPRESSION: No evidence of acute chest disease  or interval changes. Electronically Signed   By: Telford Nab M.D.   On: 02/03/2021 00:33    EKG: Independently reviewed.  Vent. rate 75 BPM PR interval 129 ms QRS duration 72 ms QT/QTcB 355/397 ms P-R-T axes 5 59 72 Sinus rhythm  Assessment/Plan Principal Problem:   Sepsis due to undetermined organism ([place]) Sepsis criteria met: Clinical (fever, tachypnea and tachycardia). Work-up (leukocytosis and mild lactic acidosis). However there is no clear source. Admit to MedSurg/inpatient. Continue gentle IV hydration. Antipyretic as needed. Continue ceftriaxone 1 g daily. Follow-up blood culture and sensitivity. Check procalcitonin level. Follow-up CBC and chemistry in AM.  Active Problems:   Acute encephalopathy Improved after fluids and ceftriaxone. Continue current measures as above. Supportive care and neurochecks every shift.    Hypertension Continue metoprolol 12.5 mg p.o. twice daily. Continue amlodipine 5 mg p.o. daily. Hydralazine 25 mg p.o. every 6 hours PRN SBP > 159 mmHg. Monitor blood pressure and heart rate closely.    Hypomagnesemia Magnesium sulfate 2 g IVPB x2.    Dementia without behavioral disturbance ([place]) As above.    History of CVA (cerebrovascular accident) Supportive care.     DVT prophylaxis: Lovenox SQ. Code Status:   Full code. Family Communication:   Disposition Plan:   Patient is from:  Home.  Anticipated DC to:  Home.  Anticipated DC date:  02/05/2021 or 02/06/2021.  Anticipated DC barriers: Clinical status. Consults called:   Admission status:  Observation/MedSurg.   Severity of Illness: High severity after presenting with  acute encephalopathy in the setting of sepsis due to undetermined organism.  The patient will remain for at least IV antibiotic therapy for 48 to 72 hours.  Reubin Milan MD [facility]  How to contact the [facility] Attending or Consulting provider [place] or covering provider during after hours [place], for this patient?   Check the care team in [facility] and look for a) attending/consulting [facility] provider listed and b) the [facility] team listed Log into [email or web address] and use [place]'s universal password to access. If you do not have the password, please contact the hospital operator. Locate the [facility] provider you are looking for under [facility] and page to a number that you can be directly reached. If you still have difficulty reaching the provider, please page the [facility] (Director on Call) for the Hospitalists listed on amion for assistance.  02/03/2021, 12:11 PM   This document was prepared using Dragon voice recognition software and may contain some unintended transcription errors.

## 2021-02-04 DIAGNOSIS — A419 Sepsis, unspecified organism: Secondary | ICD-10-CM | POA: Diagnosis not present

## 2021-02-04 LAB — BASIC METABOLIC PANEL
Anion gap: 8 (ref 5–15)
BUN: 14 mg/dL (ref 8–23)
CO2: 22 mmol/L (ref 22–32)
Calcium: 9.3 mg/dL (ref 8.9–10.3)
Chloride: 103 mmol/L (ref 98–111)
Creatinine, Ser: 0.65 mg/dL (ref 0.44–1.00)
GFR, Estimated: >60 mL/min (ref >60)
Glucose, Bld: 71 mg/dL (ref 70–99)
Potassium: 3.6 mmol/L (ref 3.5–5.1)
Sodium: 133 mmol/L — ABNORMAL LOW (ref 135–145)

## 2021-02-04 LAB — CBC WITH DIFFERENTIAL/PLATELET
Band Neutrophils: 2 %
Basophils Relative: 0 %
Blasts: NONE SEEN %
Eosinophils Relative: 0 %
HCT: 37.3 % (ref 36.0–46.0)
Hemoglobin: 12.1 g/dL (ref 12.0–15.0)
Lymphocytes Relative: 5 %
MCH: 29.7 pg (ref 26.0–34.0)
MCHC: 32.4 g/dL (ref 30.0–36.0)
MCV: 91.6 fL (ref 80.0–100.0)
Metamyelocytes Relative: NONE SEEN %
Monocytes Relative: 4 %
Myelocytes: NONE SEEN %
Neutrophils Relative %: 89 %
Platelets: 175 10*3/uL (ref 150–400)
Promyelocytes Relative: NONE SEEN %
RBC Morphology: NORMAL
RBC: 4.07 MIL/uL (ref 3.87–5.11)
RDW: 13.3 % (ref 11.5–15.5)
WBC Morphology: NORMAL
WBC: 6.8 10*3/uL (ref 4.0–10.5)
nRBC: 0 % (ref 0.0–0.2)
nRBC: NONE SEEN /100 WBC

## 2021-02-04 NOTE — Plan of Care (Signed)
  Problem: Education: Goal: Knowledge of General Education information will improve Description Including pain rating scale, medication(s)/side effects and non-pharmacologic comfort measures Outcome: Progressing   Problem: Health Behavior/Discharge Planning: Goal: Ability to manage health-related needs will improve Outcome: Progressing   

## 2021-02-04 NOTE — Progress Notes (Signed)
PROGRESS NOTE    Adreanne Yono  YQI:[id number] DOB: 10-03-1945 DOA: 02/02/2021 PCP: Jolinda Croak, MD   Brief Narrative:  Olivia Ross is a 75 y.o. female with medical history significant of breast cancer, history of other known hemorrhagic CVA, hypertension, dementia without behavioral disturbance, history of E. coli bacteremia in September 2022 who was taken to the emergency department after at [facility] due to altered mental status.  [name] for infectious process given negative chest x-ray or other symptoms questionable urinary frequency.   Assessment & Plan:  Sepsis due to undetermined organism [facility]), presumed UTI POA Concurrent lactic acidosis Cultures pending, sepsis criteria improving with antibiotics and IV fluids Presumed UTI, continue ceftriaxone x3 days Procalcitonin greater than 1   Acute metabolic encephalopathy secondary to above, resolving continues to improve after IV fluids and antibiotics consistent with infectious process CT head shows old infarcts but no acute process Improved after fluids and ceftriaxone. Continue current measures as above. Supportive care and neurochecks every shift.   Hypertension Continue metoprolol, amlodipine Continue hydralazine as needed  Hypomagnesemia Magnesium sulfate 2 g IVPB x2.   Dementia without behavioral disturbance ([place]) As above.   History of CVA (cerebrovascular accident) Supportive care.   DVT prophylaxis:      Lovenox SQ. Code Status:              Full code. Family Communication: None available  Status is: Inpatient  Dispo: The patient is from: Home              Anticipated d/c is to: Home              Anticipated d/c date is: 24 to 48 hours              Patient currently not medically stable for discharge  Consultants:  None  Procedures:  None  Antimicrobials:  Ceftriaxone x3 days  Subjective: Is no acute issues or events overnight denies nausea vomiting  diarrhea constipation headache fevers chills or chest pain  Objective: Vitals:   02/03/21 1106 02/03/21 1158 02/03/21 2105 02/04/21 0618  BP:  (!) 143/51 (!) 173/75 (!) 150/45  Pulse:  (!) 103 (!) 102 (!) 55  Resp:  18 15 16   Temp:  99.5 F (37.5 C) 99.2 F (37.3 C) 99.5 F (37.5 C)  TempSrc:  Oral Oral Oral  SpO2:  100% 100% 100%  Weight: 65.1 kg     Height: 5\' 5"  (1.651 m)       Intake/Output Summary (Last 24 hours) at 02/04/2021 0755 Last data filed at 02/04/2021 0618 Gross per 24 hour  Intake 1051.19 ml  Output 1250 ml  Net -198.81 ml   Filed Weights   02/03/21 1106  Weight: 65.1 kg    Examination:  General exam: Appears calm and comfortable  Respiratory system: Clear to auscultation. Respiratory effort normal. Cardiovascular system: S1 & S2 heard, RRR. No JVD, murmurs, rubs, gallops or clicks. No pedal edema. Gastrointestinal system: Abdomen is nondistended, soft and nontender. No organomegaly or masses felt. Normal bowel sounds heard. Central nervous system: Alert and oriented. No focal neurological deficits. Extremities: Symmetric 5 x 5 power. Skin: No rashes, lesions or ulcers Psychiatry: Judgement and insight appear normal. Mood & affect appropriate.     Data Reviewed: I have personally reviewed following labs and imaging studies  CBC: Recent Labs  Lab 02/03/21 0105 02/04/21 0331  WBC 15.0* 6.8  NEUTROABS 14.3*  --   HGB 12.4 12.1  HCT 38.3 37.3  MCV 91.4 91.6  PLT 214 254   Basic Metabolic Panel: Recent Labs  Lab 02/03/21 0105 02/03/21 1145 02/04/21 0331  NA 136  --  133*  K 4.1  --  3.6  CL 103  --  103  CO2 23  --  22  GLUCOSE 115*  --  71  BUN 16  --  14  CREATININE 0.80  --  0.65  CALCIUM 9.9  --  9.3  MG  --  1.6*  --    GFR: Estimated Creatinine Clearance: 53 mL/min (by C-G formula based on SCr of 0.65 mg/dL). Liver Function Tests: Recent Labs  Lab 02/03/21 0105  AST 26  ALT 15  ALKPHOS 92  BILITOT 0.6  PROT 7.0   ALBUMIN 3.6   No results for input(s): LIPASE, AMYLASE in the last 168 hours. No results for input(s): AMMONIA in the last 168 hours. Coagulation Profile: No results for input(s): INR, PROTIME in the last 168 hours. Cardiac Enzymes: No results for input(s): CKTOTAL, CKMB, CKMBINDEX, TROPONINI in the last 168 hours. BNP (last 3 results) No results for input(s): PROBNP in the last 8760 hours. HbA1C: No results for input(s): HGBA1C in the last 72 hours. CBG: No results for input(s): GLUCAP in the last 168 hours. Lipid Profile: No results for input(s): CHOL, HDL, LDLCALC, TRIG, CHOLHDL, LDLDIRECT in the last 72 hours. Thyroid Function Tests: No results for input(s): TSH, T4TOTAL, FREET4, T3FREE, THYROIDAB in the last 72 hours. Anemia Panel: No results for input(s): VITAMINB12, FOLATE, FERRITIN, TIBC, IRON, RETICCTPCT in the last 72 hours. Sepsis Labs: Recent Labs  Lab 02/03/21 0315 02/03/21 0505 02/03/21 1145  PROCALCITON  --   --  1.09  LATICACIDVEN 2.1* 2.1*  --     Recent Results (from the past 240 hour(s))  Resp Panel by RT-PCR (Flu A&B, Covid) Nasopharyngeal Swab     Status: None   Collection Time: 02/02/21 11:14 PM   Specimen: Nasopharyngeal Swab; Nasopharyngeal(NP) swabs in vial transport medium  Result Value Ref Range Status   SARS Coronavirus 2 by RT PCR NEGATIVE NEGATIVE Final    Comment: (NOTE) SARS-CoV-2 target nucleic acids are NOT DETECTED.  The SARS-CoV-2 RNA is generally detectable in upper respiratory specimens during the acute phase of infection. The lowest concentration of SARS-CoV-2 viral copies this assay can detect is 138 copies/mL. A negative result does not preclude SARS-Cov-2 infection and should not be used as the sole basis for treatment or other patient management decisions. A negative result may occur with  improper specimen collection/handling, submission of specimen other than nasopharyngeal swab, presence of viral mutation(s) within  the areas targeted by this assay, and inadequate number of viral copies(<138 copies/mL). A negative result must be combined with clinical observations, patient history, and epidemiological information. The expected result is Negative.  Fact Sheet for Patients:  EntrepreneurPulse.com.au  Fact Sheet for Healthcare Providers:  IncredibleEmployment.be  This test is no t yet approved or cleared by the [place] FDA and  has been authorized for detection and/or diagnosis of SARS-CoV-2 by FDA under an Emergency Use Authorization (EUA). This EUA will remain  in effect (meaning this test can be used) for the duration of the COVID-19 declaration under Section 564(b)(1) of the Act, 21 U.S.C.section 360bbb-3(b)(1), unless the authorization is terminated  or revoked sooner.       Influenza A by PCR NEGATIVE NEGATIVE Final   Influenza B by PCR NEGATIVE NEGATIVE Final    Comment: (NOTE) The  Xpert Xpress SARS-CoV-2/FLU/RSV plus assay is intended as an aid in the diagnosis of influenza from Nasopharyngeal swab specimens and should not be used as a sole basis for treatment. Nasal washings and aspirates are unacceptable for Xpert Xpress SARS-CoV-2/FLU/RSV testing.  Fact Sheet for Patients: EntrepreneurPulse.com.au  Fact Sheet for Healthcare Providers: IncredibleEmployment.be  This test is not yet approved or cleared by the [place] FDA and has been authorized for detection and/or diagnosis of SARS-CoV-2 by FDA under an Emergency Use Authorization (EUA). This EUA will remain in effect (meaning this test can be used) for the duration of the COVID-19 declaration under Section 564(b)(1) of the Act, 21 U.S.C. section 360bbb-3(b)(1), unless the authorization is terminated or revoked.  Performed at [facility], [address], [place], [place] [address]          Radiology Studies: CT Head Wo  Contrast  Result Date: 02/03/2021 CLINICAL DATA:  Trauma. EXAM: CT HEAD WITHOUT CONTRAST TECHNIQUE: Contiguous axial images were obtained from the base of the skull through the vertex without intravenous contrast. COMPARISON:  Head CT dated 01/23/2021. FINDINGS: Brain: Moderate age-related atrophy and chronic microvascular ischemic changes. Large area of old infarct and encephalomalacia involving the right MCA territory as well as areas of old infarct in the left frontal lobe and bilateral cerebellar hemispheres. Small bilateral basal ganglia old lacunar infarcts. There is no acute intracranial hemorrhage. No mass effect or midline shift. No extra-axial fluid collection. Vascular: No hyperdense vessel or unexpected calcification. Skull: Normal. Negative for fracture or focal lesion. Sinuses/Orbits: No acute finding. Other: None IMPRESSION: 1. No acute intracranial pathology. 2. Moderate age-related atrophy and chronic microvascular ischemic changes. Multiple old infarcts as described. Electronically Signed   By: Anner Crete M.D.   On: 02/03/2021 00:38   DG Chest Portable 1 View  Result Date: 02/03/2021 CLINICAL DATA:  Altered mental status. EXAM: PORTABLE CHEST 1 VIEW COMPARISON:  Portable chest 01/23/2021 FINDINGS: The heart size and mediastinal contours are within normal limits. Patchy calcification again noted aortic arch. Both lungs are clear. The visualized skeletal structures are unremarkable. Again noted are surgical clips in the right axilla and left upper abdomen. IMPRESSION: No evidence of acute chest disease  or interval changes. Electronically Signed   By: Telford Nab M.D.   On: 02/03/2021 00:33     Scheduled Meds:  acetaminophen (TYLENOL) oral liquid 160 mg/5 mL  650 mg Oral Once   amLODipine  5 mg Oral Daily   enoxaparin (LOVENOX) injection  40 mg Subcutaneous Daily   metoprolol tartrate  12.5 mg Oral BID   Continuous Infusions:  sodium chloride 75 mL/hr at 02/04/21 0016    cefTRIAXone (ROCEPHIN)  IV 1 g (02/04/21 0530)     LOS: 1 day   Time spent: 26min  Irva Loser C Caitlain Tweed, DO [facility]  If 7PM-7AM, please contact night-coverage [email or web address]  02/04/2021, 7:55 AM

## 2021-02-04 NOTE — Evaluation (Signed)
Physical Therapy Evaluation Patient Details Name: Olivia Ross MRN: [id number] DOB: [birth year] Today's Date: 02/04/2021  History of Present Illness  75 yo female admitted with acute encephalopathy, weakness, and sepsis. Hx of dementia, recurrent UTI, breast ca, CVA  Clinical Impression  On eval, pt required Mod A for mobility. She walked around the bed, ~15 feet, with use of a RW. Pt requires multimodal cueing for participation. She doesn't initiate tasks well. No family present during session. Recommend HHPT f/u (if daughter is agreeable) and 24/7 supervision/assist. Will plan to follow pt during hospital stay.       Recommendations for follow up therapy are one component of a multi-disciplinary discharge planning process, led by the attending physician.  Recommendations may be updated based on patient status, additional functional criteria and insurance authorization.  Follow Up Recommendations Home health PT    Assistance Recommended at Discharge Frequent or constant Supervision/Assistance  Functional Status Assessment    Equipment Recommendations  None recommended by PT    Recommendations for Other Services       Precautions / Restrictions Precautions Precautions: Fall Precaution Comments: incontinent Restrictions Weight Bearing Restrictions: No      Mobility  Bed Mobility Overal bed mobility: Needs Assistance Bed Mobility: Supine to Sit     Supine to sit: Mod assist;HOB elevated     General bed mobility comments: Assist for trunk and bil LEs. Utilized bedpad to assist with scooting.    Transfers Overall transfer level: Needs assistance Equipment used: Rolling walker (2 wheels) Transfers: Sit to/from Stand Sit to Stand: Min assist;From elevated surface           General transfer comment: Multimodal cueing required. Assist to power up, control descent.    Ambulation/Gait Ambulation/Gait assistance: Min assist Gait Distance (Feet): 15 Feet Assistive  device: Rolling walker (2 wheels) Gait Pattern/deviations: Step-through pattern;Decreased stride length     General Gait Details: Walked around bed with RW. Slow gait. Multimodal cueing required.  Stairs            Wheelchair Mobility    Modified Rankin (Stroke Patients Only)       Balance Overall balance assessment: Needs assistance         Standing balance support: Bilateral upper extremity supported Standing balance-Leahy Scale: Poor                               Pertinent Vitals/Pain Pain Assessment: No/denies pain    Home Living Family/patient expects to be discharged to:: Private residence Living Arrangements: Children Available Help at Discharge: Available 24 hours/day Type of Home: Apartment         Home Layout: One level Home Equipment: Conservation officer, nature (2 wheels);Shower seat Additional Comments: info taken from previous admission    Prior Function Prior Level of Function : Needs assist             Mobility Comments: uses RW ADLs Comments: daughter assists as needed     Hand Dominance        Extremity/Trunk Assessment   Upper Extremity Assessment Upper Extremity Assessment: Generalized weakness    Lower Extremity Assessment Lower Extremity Assessment: Generalized weakness    Cervical / Trunk Assessment Cervical / Trunk Assessment: Kyphotic  Communication   Communication: No difficulties  Cognition Arousal/Alertness: Awake/alert Behavior During Therapy: WFL for tasks assessed/performed Overall Cognitive Status: History of cognitive impairments - at baseline  General Comments: increased time. requires multimodal cueing. poor initiation of tasks        General Comments      Exercises     Assessment/Plan    PT Assessment Patient needs continued PT services  PT Problem List Decreased strength;Decreased balance;Decreased activity tolerance;Decreased  mobility;Decreased cognition;Decreased safety awareness;Decreased knowledge of use of DME       PT Treatment Interventions DME instruction;Gait training;Functional mobility training;Therapeutic activities;Balance training;Patient/family education;Therapeutic exercise    PT Goals (Current goals can be found in the Care Plan section)  Acute Rehab PT Goals Patient Stated Goal: none stated PT Goal Formulation: Patient unable to participate in goal setting Time For Goal Achievement: 02/18/21 Potential to Achieve Goals: Fair    Frequency Min 3X/week   Barriers to discharge        Co-evaluation               AM-PAC PT "6 Clicks" Mobility  Outcome Measure Help needed turning from your back to your side while in a flat bed without using bedrails?: A Lot Help needed moving from lying on your back to sitting on the side of a flat bed without using bedrails?: A Lot Help needed moving to and from a bed to a chair (including a wheelchair)?: A Little Help needed standing up from a chair using your arms (e.g., wheelchair or bedside chair)?: A Little Help needed to walk in hospital room?: A Little Help needed climbing 3-5 steps with a railing? : A Lot 6 Click Score: 15    End of Session Equipment Utilized During Treatment: Gait belt Activity Tolerance: Patient tolerated treatment well Patient left: in chair;with call bell/phone within reach;with chair alarm set   PT Visit Diagnosis: Muscle weakness (generalized) (M62.81);Difficulty in walking, not elsewhere classified (R26.2)    Time: 3491-7915 PT Time Calculation (min) (ACUTE ONLY): 15 min   Charges:   PT Evaluation $PT Eval Moderate Complexity: 1 Mod             Doreatha Massed, PT Acute Rehabilitation  Office: [phone] Pager: [phone]

## 2021-02-04 NOTE — Plan of Care (Signed)
  Problem: Education: Goal: Knowledge of General Education information will improve Description: Including pain rating scale, medication(s)/side effects and non-pharmacologic comfort measures Outcome: Progressing   Problem: Coping: Goal: Level of anxiety will decrease Outcome: Progressing   Problem: Safety: Goal: Ability to remain free from injury will improve Outcome: Progressing   

## 2021-02-05 DIAGNOSIS — A419 Sepsis, unspecified organism: Secondary | ICD-10-CM | POA: Diagnosis not present

## 2021-02-05 LAB — CBC WITH DIFFERENTIAL/PLATELET
Abs Immature Granulocytes: 0.02 10*3/uL (ref 0.00–0.07)
Basophils Absolute: 0 10*3/uL (ref 0.0–0.1)
Basophils Relative: 0 %
Eosinophils Absolute: 0.1 10*3/uL (ref 0.0–0.5)
Eosinophils Relative: 2 %
HCT: 34.8 % — ABNORMAL LOW (ref 36.0–46.0)
Hemoglobin: 11.6 g/dL — ABNORMAL LOW (ref 12.0–15.0)
Immature Granulocytes: 0 %
Lymphocytes Relative: 17 %
Lymphs Abs: 1.1 10*3/uL (ref 0.7–4.0)
MCH: 29.8 pg (ref 26.0–34.0)
MCHC: 33.3 g/dL (ref 30.0–36.0)
MCV: 89.5 fL (ref 80.0–100.0)
Monocytes Absolute: 0.4 10*3/uL (ref 0.1–1.0)
Monocytes Relative: 6 %
Neutro Abs: 4.9 10*3/uL (ref 1.7–7.7)
Neutrophils Relative %: 75 %
Platelets: 169 10*3/uL (ref 150–400)
RBC: 3.89 MIL/uL (ref 3.87–5.11)
RDW: 13.2 % (ref 11.5–15.5)
WBC: 6.6 10*3/uL (ref 4.0–10.5)
nRBC: 0 % (ref 0.0–0.2)

## 2021-02-05 NOTE — Progress Notes (Signed)
VAST consult received to obtain IV access. Pt's arms assessed bilaterally utilizing ultrasound. No appropriate vessels for USGIV placement noted. Possible placement of midline if necessary. Unit RN notified and to contact MD regarding arm assessment. Unit RN will place another consult if midline needed.

## 2021-02-05 NOTE — Progress Notes (Signed)
Patient discharged to home w/ dtr. Given all belongings, instructions. Verbalized understanding of instructions. Escorted to pov via w/c. 

## 2021-02-05 NOTE — TOC Transition Note (Signed)
Transition of Care [facility]) - CM/SW Discharge Note  Patient Details  Name: Kylynn Street MRN: [id number] Date of Birth: 14-Mar-1946  [facility] CM/SW Contact:  Sherie Don, LCSW Phone Number: 02/05/2021, 1:37 PM  Clinical Narrative: PT evaluation recommended HHPT. Per chart review, patient was set up with [facility] through [place] in September. CSW confirmed patient is active with [facility] for PT and OT. New HH orders placed. CSW notified Alvis Lemmings of orders. TOC signing off.  Final next level of care: [place] Barriers to Discharge: Barriers Resolved  Discharge Plan and Services        DME Arranged: N/A DME Agency: NA HH Arranged: PT, OT HH Agency: [place] Date [place]: 02/05/21 Representative spoke with at [place]: Cindie/Cory  Readmission Risk Interventions No flowsheet data found.

## 2021-02-05 NOTE — Discharge Summary (Signed)
Physician Discharge Summary  Olivia Ross YPP:[id number] DOB: 08/27/1945 DOA: 02/02/2021  PCP: Jolinda Croak, MD  Admit date: 02/02/2021 Discharge date: 02/05/2021  Admitted From: Home Disposition: Home  Recommendations for Outpatient Follow-up:  Follow up with PCP in 1-2 weeks Please obtain BMP/CBC in one week  Home Health: Home health PT OT Equipment/Devices: None  Discharge Condition: Stable CODE STATUS: Full Diet recommendation: As tolerated  Brief/Interim Summary: Olivia Ross is a 75 y.o. female with medical history significant of breast cancer, history of other known hemorrhagic CVA, hypertension, dementia without behavioral disturbance, history of E. coli bacteremia in September 2022 who was taken to the emergency department after at [facility] due to altered mental status.  [name] for infectious process given negative chest x-ray or other symptoms questionable urinary frequency.   Patient admitted as above with sepsis and altered mental status secondary to UTI and concurrent lactic acidosis resolved quite quickly with IV fluids and antibiotics.  She is now completed her therapy course, otherwise stable and agreeable for discharge home with family to resume home health PT and OT.  Mental status appears to be back to baseline per discussion with family.   Assessment & Plan:   Sepsis due to undetermined organism [facility]), presumed UTI POA Concurrent lactic acidosis Cultures pending, sepsis criteria improving with antibiotics and IV fluids Presumed UTI, continue ceftriaxone x3 days Procalcitonin greater than 1   Acute metabolic encephalopathy secondary to above, resolving continues to improve after IV fluids and antibiotics consistent with infectious process CT head shows old infarcts but no acute process Improved after fluids and ceftriaxone. Continue current measures as above. Supportive care and neurochecks every shift.    Hypertension Continue metoprolol, amlodipine Continue hydralazine as needed  Hypomagnesemia Magnesium sulfate 2 g IVPB x2.   Dementia without behavioral disturbance ([place]) As above.   History of CVA (cerebrovascular accident) Supportive care.  Discharge Instructions  Discharge Instructions     Face-to-face encounter (required for Medicare/Medicaid patients)   Complete by: As directed    The encounter with the patient was in whole, or in part, for the following medical condition, which is the primary reason for home health care: ambulatory dysfunction, UTI   I certify that, based on my findings, the following services are medically necessary home health services: Physical therapy   Reason for Medically Necessary Home Health Services: Therapy- Personnel officer, Public librarian   My clinical findings support the need for the above services: Unable to leave home safely without assistance and/or assistive device   Further, I certify that my clinical findings support that this patient is homebound due to: Unable to leave home safely without assistance   Home Health   Complete by: As directed    To provide the following care/treatments: PT      Allergies as of 02/05/2021       Reactions   Diphth-acell Pertussis-tetanus    Other reaction(s): Other (See Comments)   Dtap-hepatitis B Recomb-ipv Other (See Comments)   unknown   Norco [hydrocodone-acetaminophen] Nausea And Vomiting   Percocet [oxycodone-acetaminophen] Nausea And Vomiting        Medication List     STOP taking these medications    amoxicillin-clavulanate 875-125 MG tablet Commonly known as: Augmentin       TAKE these medications    acetaminophen 500 MG tablet Commonly known as: TYLENOL Take 500 mg by mouth every 6 (six) hours as needed for moderate pain.   amLODipine  5 MG tablet Commonly known as: NORVASC Take 5 mg by mouth daily as needed (if blodd pressure is over 120).    carboxymethylcellulose 0.5 % Soln Commonly known as: REFRESH PLUS Place 1 drop into both eyes 3 (three) times daily as needed (dry eyes).   cyanocobalamin 100 MCG tablet Take 1 tablet (100 mcg total) by mouth daily.   dicyclomine 10 MG capsule Commonly known as: BENTYL Take 10 mg by mouth daily as needed for spasms.   letrozole 2.5 MG tablet Commonly known as: FEMARA Take 2.5 mg by mouth daily.   magnesium oxide 400 (240 Mg) MG tablet Commonly known as: MAG-OX Take 0.5 tablets (200 mg total) by mouth 2 (two) times daily.   metoprolol tartrate 25 MG tablet Commonly known as: LOPRESSOR Take 0.5 tablets (12.5 mg total) by mouth 2 (two) times daily.   multivitamin with minerals Tabs tablet Take 1 tablet by mouth daily. Centrum   valsartan 160 MG tablet Commonly known as: DIOVAN Take 160 mg by mouth daily.        Follow-up Information     Care, [facility] Follow up.   Specialty: Home Health Services Why: PT and OT Contact information: [address] [place] [address] [phone]                Allergies  Allergen Reactions   Diphth-Acell Pertussis-Tetanus     Other reaction(s): Other (See Comments)   Dtap-Hepatitis B Recomb-Ipv Other (See Comments)    unknown   Norco [Hydrocodone-Acetaminophen] Nausea And Vomiting   Percocet [Oxycodone-Acetaminophen] Nausea And Vomiting   Procedures/Studies: CT Head Wo Contrast  Result Date: 02/03/2021 CLINICAL DATA:  Trauma. EXAM: CT HEAD WITHOUT CONTRAST TECHNIQUE: Contiguous axial images were obtained from the base of the skull through the vertex without intravenous contrast. COMPARISON:  Head CT dated 01/23/2021. FINDINGS: Brain: Moderate age-related atrophy and chronic microvascular ischemic changes. Large area of old infarct and encephalomalacia involving the right MCA territory as well as areas of old infarct in the left frontal lobe and bilateral cerebellar hemispheres. Small bilateral basal  ganglia old lacunar infarcts. There is no acute intracranial hemorrhage. No mass effect or midline shift. No extra-axial fluid collection. Vascular: No hyperdense vessel or unexpected calcification. Skull: Normal. Negative for fracture or focal lesion. Sinuses/Orbits: No acute finding. Other: None IMPRESSION: 1. No acute intracranial pathology. 2. Moderate age-related atrophy and chronic microvascular ischemic changes. Multiple old infarcts as described. Electronically Signed   By: Anner Crete M.D.   On: 02/03/2021 00:38   CT Head Wo Contrast  Result Date: 01/23/2021 CLINICAL DATA:  Mental status change EXAM: CT HEAD WITHOUT CONTRAST TECHNIQUE: Contiguous axial images were obtained from the base of the skull through the vertex without intravenous contrast. COMPARISON:  CT 12/20/2020, 07/21/2020 FINDINGS: Brain: No acute territorial infarction, hemorrhage or intracranial mass. 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 Chronic left frontal lobe infarct. Multiple chronic lacunar infarcts in the basal ganglia. Multiple small chronic cerebellar infarcts. Hypodensity within the pons on sagittal view, suspect related to artifact. There may be chronic small vessel ischemic change in the pons. Stable ventricle size. Atrophy and chronic small vessel ischemic changes of the white matter. Vascular: No hyperdense vessels. Carotid vascular calcification. Small of gas in the cavernous sinus possibly related to line placement. Skull: Normal. Negative for fracture or focal lesion. Sinuses/Orbits: No acute finding. Other: None IMPRESSION: 1. No definite CT evidence for acute intracranial abnormality.  2. Atrophy and chronic small vessel ischemic changes of the white matter. Multiple chronic bilateral infarcts including large chronic right MCA infarct. Hypodensity in the pons is suspected to be due to combination of artifact and small vessel disease. Electronically  Signed   By: Donavan Foil M.D.   On: 01/23/2021 22:32   DG Chest Portable 1 View  Result Date: 02/03/2021 CLINICAL DATA:  Altered mental status. EXAM: PORTABLE CHEST 1 VIEW COMPARISON:  Portable chest 01/23/2021 FINDINGS: The heart size and mediastinal contours are within normal limits. Patchy calcification again noted aortic arch. Both lungs are clear. The visualized skeletal structures are unremarkable. Again noted are surgical clips in the right axilla and left upper abdomen. IMPRESSION: No evidence of acute chest disease  or interval changes. Electronically Signed   By: Telford Nab M.D.   On: 02/03/2021 00:33   DG Chest Port 1 View  Result Date: 01/23/2021 CLINICAL DATA:  Generalized weakness EXAM: PORTABLE CHEST 1 VIEW COMPARISON:  12/19/2020 FINDINGS: Heart and mediastinal contours are within normal limits. No focal opacities or effusions. No acute bony abnormality. IMPRESSION: No active disease. Electronically Signed   By: Rolm Baptise M.D.   On: 01/23/2021 22:16     Subjective: No acute issues or events overnight denies nausea vomiting diarrhea constipation headache fevers chills or chest pain   Discharge Exam: Vitals:   02/05/21 1034 02/05/21 1344  BP:  112/82  Pulse: (!) 58 (!) 55  Resp:  18  Temp:  98.2 F (36.8 C)  SpO2:  100%   Vitals:   02/04/21 2328 02/05/21 0523 02/05/21 1034 02/05/21 1344  BP: (!) 155/50 (!) 143/66  112/82  Pulse: 60 60 (!) 58 (!) 55  Resp: 16 16  18   Temp: 99 F (37.2 C) 98.4 F (36.9 C)  98.2 F (36.8 C)  TempSrc: Oral Oral  Oral  SpO2: 100% 100%  100%  Weight:      Height:        General: Pt is alert, awake, not in acute distress Cardiovascular: RRR, S1/S2 +, no rubs, no gallops Respiratory: CTA bilaterally, no wheezing, no rhonchi Abdominal: Soft, NT, ND, bowel sounds + Extremities: no edema, no cyanosis    The results of significant diagnostics from this hospitalization (including imaging, microbiology, ancillary and  laboratory) are listed below for reference.     Microbiology: Recent Results (from the past 240 hour(s))  Resp Panel by RT-PCR (Flu A&B, Covid) Nasopharyngeal Swab     Status: None   Collection Time: 02/02/21 11:14 PM   Specimen: Nasopharyngeal Swab; Nasopharyngeal(NP) swabs in vial transport medium  Result Value Ref Range Status   SARS Coronavirus 2 by RT PCR NEGATIVE NEGATIVE Final    Comment: (NOTE) SARS-CoV-2 target nucleic acids are NOT DETECTED.  The SARS-CoV-2 RNA is generally detectable in upper respiratory specimens during the acute phase of infection. The lowest concentration of SARS-CoV-2 viral copies this assay can detect is 138 copies/mL. A negative result does not preclude SARS-Cov-2 infection and should not be used as the sole basis for treatment or other patient management decisions. A negative result may occur with  improper specimen collection/handling, submission of specimen other than nasopharyngeal swab, presence of viral mutation(s) within the areas targeted by this assay, and inadequate number of viral copies(<138 copies/mL). A negative result must be combined with clinical observations, patient history, and epidemiological information. The expected result is Negative.  Fact Sheet for Patients:  EntrepreneurPulse.com.au  Fact Sheet for Healthcare Providers:  IncredibleEmployment.be  This test is no t yet approved or cleared by the [place] and  has been authorized for detection and/or diagnosis of SARS-CoV-2 by FDA under an Emergency Use Authorization (EUA). This EUA will remain  in effect (meaning this test can be used) for the duration of the COVID-19 declaration under Section 564(b)(1) of the Act, 21 U.S.C.section 360bbb-3(b)(1), unless the authorization is terminated  or revoked sooner.       Influenza A by PCR NEGATIVE NEGATIVE Final   Influenza B by PCR NEGATIVE NEGATIVE Final    Comment: (NOTE) The  Xpert Xpress SARS-CoV-2/FLU/RSV plus assay is intended as an aid in the diagnosis of influenza from Nasopharyngeal swab specimens and should not be used as a sole basis for treatment. Nasal washings and aspirates are unacceptable for Xpert Xpress SARS-CoV-2/FLU/RSV testing.  Fact Sheet for Patients: EntrepreneurPulse.com.au  Fact Sheet for Healthcare Providers: IncredibleEmployment.be  This test is not yet approved or cleared by the [place] FDA and has been authorized for detection and/or diagnosis of SARS-CoV-2 by FDA under an Emergency Use Authorization (EUA). This EUA will remain in effect (meaning this test can be used) for the duration of the COVID-19 declaration under Section 564(b)(1) of the Act, 21 U.S.C. section 360bbb-3(b)(1), unless the authorization is terminated or revoked.  Performed at [facility], [address] [place], [place] [address]   Blood culture (routine x 2)     Status: None (Preliminary result)   Collection Time: 02/03/21  1:05 AM   Specimen: BLOOD  Result Value Ref Range Status   Specimen Description   Final    BLOOD RIGHT ANTECUBITAL Performed at [facility], [address] [place], [place] [address]    Special Requests   Final    BOTTLES DRAWN AEROBIC AND ANAEROBIC Blood Culture adequate volume Performed at [facility], [address] [place], [place] [address]    Culture   Final    NO GROWTH 2 DAYS Performed at [facility], [place] [address] [place], [place] [address]    Report Status PENDING  Incomplete  Blood culture (routine x 2)     Status: None (Preliminary result)   Collection Time: 02/03/21  3:02 AM   Specimen: BLOOD RIGHT HAND  Result Value Ref Range Status   Specimen Description   Final    BLOOD RIGHT HAND Performed at [facility], [address] [place], [place] [address]    Special Requests   Final    BOTTLES DRAWN AEROBIC AND  ANAEROBIC Blood Culture results may not be optimal due to an inadequate volume of blood received in culture bottles Performed at [facility], [address] [place], [place] [address]    Culture   Final    NO GROWTH 2 DAYS Performed at [facility], [place] [address] [place], [place] [address]    Report Status PENDING  Incomplete     Labs: BNP (last 3 results) Recent Labs    01/23/21 2306  BNP 10.9   Basic Metabolic Panel: Recent Labs  Lab 02/03/21 0105 02/03/21 1145 02/04/21 0331  NA 136  --  133*  K 4.1  --  3.6  CL 103  --  103  CO2 23  --  22  GLUCOSE 115*  --  71  BUN 16  --  14  CREATININE 0.80  --  0.65  CALCIUM 9.9  --  9.3  MG  --  1.6*  --    Liver Function Tests: Recent Labs  Lab 02/03/21 0105  AST 26  ALT 15  ALKPHOS 92  BILITOT 0.6  PROT 7.0  ALBUMIN 3.6   No results for input(s): LIPASE, AMYLASE in the last 168 hours. No results for input(s): AMMONIA in the last 168 hours. CBC: Recent Labs  Lab 02/03/21 0105 02/04/21 0331 02/05/21 0327  WBC 15.0* 6.8 6.6  NEUTROABS 14.3*  --  4.9  HGB 12.4 12.1 11.6*  HCT 38.3 37.3 34.8*  MCV 91.4 91.6 89.5  PLT 214 175 169   Cardiac Enzymes: No results for input(s): CKTOTAL, CKMB, CKMBINDEX, TROPONINI in the last 168 hours. BNP: Invalid input(s): POCBNP CBG: No results for input(s): GLUCAP in the last 168 hours. D-Dimer No results for input(s): DDIMER in the last 72 hours. Hgb A1c No results for input(s): HGBA1C in the last 72 hours. Lipid Profile No results for input(s): CHOL, HDL, LDLCALC, TRIG, CHOLHDL, LDLDIRECT in the last 72 hours. Thyroid function studies No results for input(s): TSH, T4TOTAL, T3FREE, THYROIDAB in the last 72 hours.  Invalid input(s): FREET3 Anemia work up No results for input(s): VITAMINB12, FOLATE, FERRITIN, TIBC, IRON, RETICCTPCT in the last 72 hours. Urinalysis    Component Value Date/Time   COLORURINE YELLOW 02/02/2021 2346   APPEARANCEUR HAZY  (A) 02/02/2021 2346   LABSPEC 1.025 02/02/2021 2346   PHURINE 5.5 02/02/2021 2346   GLUCOSEU NEGATIVE 02/02/2021 2346   HGBUR NEGATIVE 02/02/2021 2346   [place] 02/02/2021 2346   KETONESUR NEGATIVE 02/02/2021 2346   PROTEINUR NEGATIVE 02/02/2021 2346   NITRITE NEGATIVE 02/02/2021 2346   LEUKOCYTESUR TRACE (A) 02/02/2021 2346   Sepsis Labs Invalid input(s): PROCALCITONIN,  WBC,  LACTICIDVEN Microbiology Recent Results (from the past 240 hour(s))  Resp Panel by RT-PCR (Flu A&B, Covid) Nasopharyngeal Swab     Status: None   Collection Time: 02/02/21 11:14 PM   Specimen: Nasopharyngeal Swab; Nasopharyngeal(NP) swabs in vial transport medium  Result Value Ref Range Status   SARS Coronavirus 2 by RT PCR NEGATIVE NEGATIVE Final    Comment: (NOTE) SARS-CoV-2 target nucleic acids are NOT DETECTED.  The SARS-CoV-2 RNA is generally detectable in upper respiratory specimens during the acute phase of infection. The lowest concentration of SARS-CoV-2 viral copies this assay can detect is 138 copies/mL. A negative result does not preclude SARS-Cov-2 infection and should not be used as the sole basis for treatment or other patient management decisions. A negative result may occur with  improper specimen collection/handling, submission of specimen other than nasopharyngeal swab, presence of viral mutation(s) within the areas targeted by this assay, and inadequate number of viral copies(<138 copies/mL). A negative result must be combined with clinical observations, patient history, and epidemiological information. The expected result is Negative.  Fact Sheet for Patients:  EntrepreneurPulse.com.au  Fact Sheet for Healthcare Providers:  IncredibleEmployment.be  This test is no t yet approved or cleared by the [place] FDA and  has been authorized for detection and/or diagnosis of SARS-CoV-2 by FDA under an Emergency Use Authorization  (EUA). This EUA will remain  in effect (meaning this test can be used) for the duration of the COVID-19 declaration under Section 564(b)(1) of the Act, 21 U.S.C.section 360bbb-3(b)(1), unless the authorization is terminated  or revoked sooner.       Influenza A by PCR NEGATIVE NEGATIVE Final   Influenza B by PCR NEGATIVE NEGATIVE Final    Comment: (NOTE) The Xpert Xpress  SARS-CoV-2/FLU/RSV plus assay is intended as an aid in the diagnosis of influenza from Nasopharyngeal swab specimens and should not be used as a sole basis for treatment. Nasal washings and aspirates are unacceptable for Xpert Xpress SARS-CoV-2/FLU/RSV testing.  Fact Sheet for Patients: EntrepreneurPulse.com.au  Fact Sheet for Healthcare Providers: IncredibleEmployment.be  This test is not yet approved or cleared by the [place] FDA and has been authorized for detection and/or diagnosis of SARS-CoV-2 by FDA under an Emergency Use Authorization (EUA). This EUA will remain in effect (meaning this test can be used) for the duration of the COVID-19 declaration under Section 564(b)(1) of the Act, 21 U.S.C. section 360bbb-3(b)(1), unless the authorization is terminated or revoked.  Performed at [facility], [address] [place], [place] [address]   Blood culture (routine x 2)     Status: None (Preliminary result)   Collection Time: 02/03/21  1:05 AM   Specimen: BLOOD  Result Value Ref Range Status   Specimen Description   Final    BLOOD RIGHT ANTECUBITAL Performed at [facility], [address] [place], [place] [address]    Special Requests   Final    BOTTLES DRAWN AEROBIC AND ANAEROBIC Blood Culture adequate volume Performed at [facility], [address] [place], [place] [address]    Culture   Final    NO GROWTH 2 DAYS Performed at [facility], [place] [address] [place], [place] [address]    Report Status PENDING   Incomplete  Blood culture (routine x 2)     Status: None (Preliminary result)   Collection Time: 02/03/21  3:02 AM   Specimen: BLOOD RIGHT HAND  Result Value Ref Range Status   Specimen Description   Final    BLOOD RIGHT HAND Performed at [facility], [address] [place], [place] [address]    Special Requests   Final    BOTTLES DRAWN AEROBIC AND ANAEROBIC Blood Culture results may not be optimal due to an inadequate volume of blood received in culture bottles Performed at [facility], [address] [place], [place] [address]    Culture   Final    NO GROWTH 2 DAYS Performed at [facility], [place] [address] [place], [place] [address]    Report Status PENDING  Incomplete     Time coordinating discharge: Over 30 minutes  SIGNED:   Little Ishikawa, DO [facility] 02/05/2021, 1:51 PM Pager   If 7PM-7AM, please contact night-coverage [email or web address]

## 2021-02-08 LAB — CULTURE, BLOOD (ROUTINE X 2)
Culture: NO GROWTH
Culture: NO GROWTH
Special Requests: ADEQUATE

## 2021-04-30 ENCOUNTER — Other Ambulatory Visit: Payer: Self-pay

## 2021-04-30 ENCOUNTER — Emergency Department (HOSPITAL_COMMUNITY)
Admission: EM | Admit: 2021-04-30 | Discharge: 2021-04-30 | Disposition: A | Discharge status: Home / Self Care | Payer: Medicare PPO | Attending: Emergency Medicine | Admitting: Emergency Medicine

## 2021-04-30 ENCOUNTER — Encounter (HOSPITAL_COMMUNITY): Payer: Self-pay | Admitting: Emergency Medicine

## 2021-04-30 DIAGNOSIS — Z853 Personal history of malignant neoplasm of breast: Secondary | ICD-10-CM | POA: Insufficient documentation

## 2021-04-30 DIAGNOSIS — R42 Dizziness and giddiness: Secondary | ICD-10-CM | POA: Insufficient documentation

## 2021-04-30 DIAGNOSIS — F0390 Unspecified dementia without behavioral disturbance: Secondary | ICD-10-CM | POA: Insufficient documentation

## 2021-04-30 DIAGNOSIS — I10 Essential (primary) hypertension: Secondary | ICD-10-CM | POA: Insufficient documentation

## 2021-04-30 DIAGNOSIS — N390 Urinary tract infection, site not specified: Secondary | ICD-10-CM

## 2021-04-30 DIAGNOSIS — A0472 Enterocolitis due to Clostridium difficile, not specified as recurrent: Secondary | ICD-10-CM

## 2021-04-30 DIAGNOSIS — E860 Dehydration: Secondary | ICD-10-CM | POA: Insufficient documentation

## 2021-04-30 DIAGNOSIS — R197 Diarrhea, unspecified: Secondary | ICD-10-CM | POA: Diagnosis present

## 2021-04-30 HISTORY — DX: Malignant neoplasm of unspecified site of unspecified female breast: C50.919

## 2021-04-30 HISTORY — DX: Unspecified dementia, unspecified severity, without behavioral disturbance, psychotic disturbance, mood disturbance, and anxiety: F03.90

## 2021-04-30 LAB — GASTROINTESTINAL PANEL BY PCR, STOOL (REPLACES STOOL CULTURE)

## 2021-04-30 LAB — CBC WITH DIFFERENTIAL/PLATELET
Abs Immature Granulocytes: 0.05 10*3/uL (ref 0.00–0.07)
Basophils Absolute: 0 10*3/uL (ref 0.0–0.1)
Basophils Relative: 0 %
Eosinophils Absolute: 0 10*3/uL (ref 0.0–0.5)
Eosinophils Relative: 0 %
HCT: 39.5 % (ref 36.0–46.0)
Hemoglobin: 12.3 g/dL (ref 12.0–15.0)
Immature Granulocytes: 0 %
Lymphocytes Relative: 5 %
Lymphs Abs: 0.9 10*3/uL (ref 0.7–4.0)
MCH: 28.8 pg (ref 26.0–34.0)
MCHC: 31.1 g/dL (ref 30.0–36.0)
MCV: 92.5 fL (ref 80.0–100.0)
Monocytes Absolute: 0.8 10*3/uL (ref 0.1–1.0)
Monocytes Relative: 5 %
Neutro Abs: 15 10*3/uL — ABNORMAL HIGH (ref 1.7–7.7)
Neutrophils Relative %: 90 %
Platelets: 177 10*3/uL (ref 150–400)
RBC: 4.27 MIL/uL (ref 3.87–5.11)
RDW: 13.2 % (ref 11.5–15.5)
WBC: 16.8 10*3/uL — ABNORMAL HIGH (ref 4.0–10.5)
nRBC: 0 % (ref 0.0–0.2)

## 2021-04-30 LAB — COMPREHENSIVE METABOLIC PANEL
ALT: 11 U/L (ref 0–44)
AST: 35 U/L (ref 15–41)
Albumin: 3.4 g/dL — ABNORMAL LOW (ref 3.5–5.0)
Alkaline Phosphatase: 79 U/L (ref 38–126)
Anion gap: 11 (ref 5–15)
BUN: 14 mg/dL (ref 8–23)
CO2: 17 mmol/L — ABNORMAL LOW (ref 22–32)
Calcium: 9.8 mg/dL (ref 8.9–10.3)
Chloride: 108 mmol/L (ref 98–111)
Creatinine, Ser: 0.85 mg/dL (ref 0.44–1.00)
GFR, Estimated: >60 mL/min (ref >60)
Glucose, Bld: 102 mg/dL — ABNORMAL HIGH (ref 70–99)
Potassium: 5.2 mmol/L — ABNORMAL HIGH (ref 3.5–5.1)
Sodium: 136 mmol/L (ref 135–145)
Total Bilirubin: 1.9 mg/dL — ABNORMAL HIGH (ref 0.3–1.2)
Total Protein: 6.2 g/dL — ABNORMAL LOW (ref 6.5–8.1)

## 2021-04-30 LAB — C DIFFICILE QUICK SCREEN W PCR REFLEX
C Diff antigen: POSITIVE — AB
C Diff toxin: NEGATIVE

## 2021-04-30 LAB — URINALYSIS, ROUTINE W REFLEX MICROSCOPIC
Bacteria, UA: NONE SEEN
Bilirubin Urine: NEGATIVE
Glucose, UA: NEGATIVE mg/dL
Hgb urine dipstick: NEGATIVE
Ketones, ur: 5 mg/dL — AB
Nitrite: NEGATIVE
Protein, ur: NEGATIVE mg/dL
Specific Gravity, Urine: 1.013 (ref 1.005–1.030)
pH: 6 (ref 5.0–8.0)

## 2021-04-30 LAB — CLOSTRIDIUM DIFFICILE BY PCR, REFLEXED: Toxigenic C. Difficile by PCR: POSITIVE — AB

## 2021-04-30 MED ORDER — VANCOMYCIN HCL 125 MG PO CAPS
125.0000 mg | ORAL_CAPSULE | Freq: Once | ORAL | Status: AC
  Administered 2021-04-30: 125 mg via ORAL
  Filled 2021-04-30: qty 1

## 2021-04-30 MED ORDER — LACTATED RINGERS IV BOLUS
1000.0000 mL | Freq: Once | INTRAVENOUS | Status: AC
  Administered 2021-04-30: 1000 mL via INTRAVENOUS

## 2021-04-30 MED ORDER — FOSFOMYCIN TROMETHAMINE 3 G PO PACK
3.0000 g | PACK | Freq: Once | ORAL | Status: AC
  Administered 2021-04-30: 3 g via ORAL
  Filled 2021-04-30: qty 3

## 2021-04-30 MED ORDER — VANCOMYCIN HCL 125 MG PO CAPS: 125.0000 mg | ORAL_CAPSULE | Freq: Four times a day (QID) | ORAL | 0 refills | Status: DC

## 2021-04-30 NOTE — ED Triage Notes (Signed)
Pt. BIB [facility] c/o dehydration and diarrhea. Pt. Was being assisted to stand by her daughter when she began to feel dizzy. She has had diarrhea for the last few days and has not been eating or drinking very much. Given 542ml NS en route by EMS. PT. Is demented at baseline.  EMS VS: HR:120 CBG:130 Sitting BP:110/80 Standing BP: 80/50

## 2021-04-30 NOTE — ED Provider Notes (Addendum)
[facility] DEPT Provider Note: Olivia Spurling, MD, FACEP  CSN: [id number] MRN: [id number] ARRIVAL: 04/30/21 at [place]: [place]  Dehydration and Diarrhea  Level 5 caveat: Dementia HISTORY OF PRESENT ILLNESS  04/30/21 12:58 AM Olivia Ross is a 76 y.o. female who has had diarrhea for several days and has had decreased appetite and oral intake.  Her daughter believes she is dehydrated because she had difficulty standing without assistance and was feeling lightheaded earlier.  She was given 500 mL normal saline IV by EMS prior to arrival.  Patient is unable to give a history and denies any acute complaints.  She was treated for urinary tract infection 02/02/2021 and admitted to the hospital for 3 days.   Past Medical History:  Diagnosis Date   Breast cancer ([place])    Dementia ([place])    Hypertension     History reviewed. No pertinent surgical history.  History reviewed. No pertinent family history.  Social History   Tobacco Use   Smoking status: Never   Smokeless tobacco: Never  Substance Use Topics   Alcohol use: Never   Drug use: Never    Prior to Admission medications   Medication Sig Start Date End Date Taking? Authorizing Provider  vancomycin (VANCOCIN) 125 MG capsule Take 1 capsule (125 mg total) by mouth 4 (four) times daily. 04/30/21  Yes Mehtab Dolberry, MD  acetaminophen (TYLENOL) 500 MG tablet Take 500 mg by mouth every 6 (six) hours as needed for moderate pain.    [provider]  amLODipine (NORVASC) 5 MG tablet Take 5 mg by mouth daily as needed (if blodd pressure is over 120). 11/02/20   [provider]  carboxymethylcellulose (REFRESH PLUS) 0.5 % SOLN Place 1 drop into both eyes 3 (three) times daily as needed (dry eyes).    [provider]  dicyclomine (BENTYL) 10 MG capsule Take 10 mg by mouth daily as needed for spasms. 07/24/20   [provider]  letrozole (FEMARA) 2.5 MG tablet Take 2.5 mg by mouth  daily. 12/12/20   [provider]  magnesium oxide (MAG-OX) 400 (240 Mg) MG tablet Take 0.5 tablets (200 mg total) by mouth 2 (two) times daily. 12/22/20   Regalado, Belkys A, MD  metoprolol tartrate (LOPRESSOR) 25 MG tablet Take 0.5 tablets (12.5 mg total) by mouth 2 (two) times daily. 12/22/20   Regalado, Belkys A, MD  Multiple Vitamin (MULTIVITAMIN WITH MINERALS) TABS tablet Take 1 tablet by mouth daily. Centrum    [provider]  valsartan (DIOVAN) 160 MG tablet Take 160 mg by mouth daily. 12/12/20   [provider]  vitamin B-12 100 MCG tablet Take 1 tablet (100 mcg total) by mouth daily. 12/23/20   Regalado, Cassie Freer, MD    Allergies Diphth-acell pertussis-tetanus, Dtap-hepatitis b recomb-ipv, Norco [hydrocodone-acetaminophen], and Percocet [oxycodone-acetaminophen]   REVIEW OF SYSTEMS  Negative except as noted here or in the History of Present Illness.   PHYSICAL EXAMINATION  Initial Vital Signs Blood pressure (!) 178/88, pulse (!) 105, temperature 98.4 F (36.9 C), temperature source Oral, resp. rate 20, SpO2 100 %.  Examination General: Well-developed, well-nourished female in no acute distress; appearance consistent with age of record HENT: normocephalic; atraumatic; poor dentition Eyes: pupils equal, round and reactive to light; extraocular muscles grossly intact Neck: supple Heart: regular rate and rhythm Lungs: clear to auscultation bilaterally Abdomen: soft; nondistended; nontender; bowel sounds present Extremities: No deformity; full range of motion; pulses normal Neurologic: Awake, alert, confused;  motor function intact in all extremities and symmetric; no facial droop Skin: Warm and dry Psychiatric: Normal mood and affect   RESULTS  Summary of this visit's results, reviewed and interpreted by myself:   EKG Interpretation  Date/Time:    Ventricular Rate:    PR Interval:    QRS Duration:   QT Interval:    QTC Calculation:   R  Axis:     Text Interpretation:         Laboratory Studies: Results for orders placed or performed during the hospital encounter of 04/30/21 (from the past 24 hour(s))  C Difficile Quick Screen w PCR reflex     Status: Abnormal   Collection Time: 04/30/21  1:03 AM   Specimen: STOOL  Result Value Ref Range   C Diff antigen POSITIVE (A) NEGATIVE   C Diff toxin NEGATIVE NEGATIVE   C Diff interpretation Results are indeterminate. See PCR results.   C. Diff by PCR, Reflexed     Status: Abnormal   Collection Time: 04/30/21  1:03 AM  Result Value Ref Range   Toxigenic C. Difficile by PCR POSITIVE (A) NEGATIVE  Comprehensive metabolic panel     Status: Abnormal   Collection Time: 04/30/21  1:04 AM  Result Value Ref Range   Sodium 136 135 - 145 mmol/L   Potassium 5.2 (H) 3.5 - 5.1 mmol/L   Chloride 108 98 - 111 mmol/L   CO2 17 (L) 22 - 32 mmol/L   Glucose, Bld 102 (H) 70 - 99 mg/dL   BUN 14 8 - 23 mg/dL   Creatinine, Ser 0.85 0.44 - 1.00 mg/dL   Calcium 9.8 8.9 - 10.3 mg/dL   Total Protein 6.2 (L) 6.5 - 8.1 g/dL   Albumin 3.4 (L) 3.5 - 5.0 g/dL   AST 35 15 - 41 U/L   ALT 11 0 - 44 U/L   Alkaline Phosphatase 79 38 - 126 U/L   Total Bilirubin 1.9 (H) 0.3 - 1.2 mg/dL   GFR, Estimated >60 >60 mL/min   Anion gap 11 5 - 15  Urinalysis, Routine w reflex microscopic Urine, In & Out Cath     Status: Abnormal   Collection Time: 04/30/21  1:04 AM  Result Value Ref Range   Color, Urine YELLOW YELLOW   APPearance CLEAR CLEAR   Specific Gravity, Urine 1.013 1.005 - 1.030   pH 6.0 5.0 - 8.0   Glucose, UA NEGATIVE NEGATIVE mg/dL   Hgb urine dipstick NEGATIVE NEGATIVE   Bilirubin Urine NEGATIVE NEGATIVE   Ketones, ur 5 (A) NEGATIVE mg/dL   Protein, ur NEGATIVE NEGATIVE mg/dL   Nitrite NEGATIVE NEGATIVE   Leukocytes,Ua LARGE (A) NEGATIVE   RBC / HPF 0-5 0 - 5 RBC/hpf   WBC, UA 11-20 0 - 5 WBC/hpf   Bacteria, UA NONE SEEN NONE SEEN   Squamous Epithelial / LPF 0-5 0 - 5   Mucus PRESENT    CBC with Differential/Platelet     Status: Abnormal   Collection Time: 04/30/21  3:00 AM  Result Value Ref Range   WBC 16.8 (H) 4.0 - 10.5 K/uL   RBC 4.27 3.87 - 5.11 MIL/uL   Hemoglobin 12.3 12.0 - 15.0 g/dL   HCT 39.5 36.0 - 46.0 %   MCV 92.5 80.0 - 100.0 fL   MCH 28.8 26.0 - 34.0 pg   MCHC 31.1 30.0 - 36.0 g/dL   RDW 13.2 11.5 - 15.5 %   Platelets 177 150 - 400 K/uL  nRBC 0.0 0.0 - 0.2 %   Neutrophils Relative % 90 %   Neutro Abs 15.0 (H) 1.7 - 7.7 K/uL   Lymphocytes Relative 5 %   Lymphs Abs 0.9 0.7 - 4.0 K/uL   Monocytes Relative 5 %   Monocytes Absolute 0.8 0.1 - 1.0 K/uL   Eosinophils Relative 0 %   Eosinophils Absolute 0.0 0.0 - 0.5 K/uL   Basophils Relative 0 %   Basophils Absolute 0.0 0.0 - 0.1 K/uL   Immature Granulocytes 0 %   Abs Immature Granulocytes 0.05 0.00 - 0.07 K/uL   Imaging Studies: No results found.  ED COURSE and MDM  Nursing notes, initial and subsequent vitals signs, including pulse oximetry, reviewed and interpreted by myself.  Vitals:   04/30/21 0215 04/30/21 0230 04/30/21 0245 04/30/21 0445  BP: (!) 185/98 (!) 188/68 (!) 199/89 (!) 191/67  Pulse: (!) 122 (!) 58 61 (!) 54  Resp: 17 15 17 16   Temp:    (!) 97.5 F (36.4 C)  TempSrc:    Oral  SpO2: 100% 100% 100% 100%   Medications  lactated ringers bolus 1,000 mL (1,000 mLs Intravenous New Bag/Given 04/30/21 0147)  fosfomycin (MONUROL) packet 3 g (3 g Oral Given 04/30/21 0426)   4:14 AM Patient given 1 L of lactated Ringer's for rehydration.  Her urinalysis is suggestive of a nascent urinary tract infection.  Her daughter, who is now at the bedside, relates that the patient has had some cold-like symptoms this week and she is concerned that this may have triggered her acute diarrhea.  She has a history of recurrent diarrhea and is followed by gastroenterology.  She also has chronic urinary tract infections and we will treat with a single dose of fosfomycin in the ED.  Her C. difficile screen  shows positive antigen but negative toxin.  This is not consistent with an active infection but verification by PCR is pending, and a GI panel by PCR has been ordered to consider alternative infectious etiologies for her diarrhea (some of which may require antibiotic therapy).  The patient's daughter was advised of her slightly elevated T bili and potassium but I do not believe these warrant acute investigation in the ED and can be followed up as an outpatient.  4:59 AM Patient's PCR is positive for C. difficile.  We will start patient on vancomycin 125 mg 4 times daily for 10 days.   PROCEDURES  Procedures   ED DIAGNOSES     ICD-10-CM   1. Diarrhea in adult patient  R19.7     2. Lower urinary tract infectious disease  N39.0     3. C. difficile diarrhea  A04.72          Monasia Lair, MD 04/30/21 0416    Shanon Rosser, MD 04/30/21 0417    Shanon Rosser, MD 04/30/21 0500    Cherrelle Plante, MD 05/22/21 2243    Oluwatobi Ruppe, Jenny Reichmann, MD 05/22/21 2244

## 2021-07-07 ENCOUNTER — Emergency Department (HOSPITAL_COMMUNITY): Payer: Medicare PPO

## 2021-07-07 ENCOUNTER — Encounter (HOSPITAL_COMMUNITY): Payer: Self-pay

## 2021-07-07 ENCOUNTER — Other Ambulatory Visit: Payer: Self-pay

## 2021-07-07 ENCOUNTER — Emergency Department (HOSPITAL_COMMUNITY)
Admission: EM | Admit: 2021-07-07 | Discharge: 2021-07-08 | Disposition: A | Discharge status: Home / Self Care | Payer: Medicare PPO | Attending: Emergency Medicine | Admitting: Emergency Medicine

## 2021-07-07 DIAGNOSIS — Z79899 Other long term (current) drug therapy: Secondary | ICD-10-CM | POA: Insufficient documentation

## 2021-07-07 DIAGNOSIS — W19XXXA Unspecified fall, initial encounter: Secondary | ICD-10-CM

## 2021-07-07 DIAGNOSIS — W06XXXA Fall from bed, initial encounter: Secondary | ICD-10-CM | POA: Diagnosis not present

## 2021-07-07 DIAGNOSIS — Z8673 Personal history of transient ischemic attack (TIA), and cerebral infarction without residual deficits: Secondary | ICD-10-CM | POA: Insufficient documentation

## 2021-07-07 DIAGNOSIS — S8992XA Unspecified injury of left lower leg, initial encounter: Secondary | ICD-10-CM | POA: Diagnosis present

## 2021-07-07 DIAGNOSIS — S80212A Abrasion, left knee, initial encounter: Secondary | ICD-10-CM | POA: Insufficient documentation

## 2021-07-07 LAB — CBC
HCT: 40 % (ref 36.0–46.0)
Hemoglobin: 12.4 g/dL (ref 12.0–15.0)
MCH: 30 pg (ref 26.0–34.0)
MCHC: 31 g/dL (ref 30.0–36.0)
MCV: 96.9 fL (ref 80.0–100.0)
Platelets: 291 10*3/uL (ref 150–400)
RBC: 4.13 MIL/uL (ref 3.87–5.11)
RDW: 15.1 % (ref 11.5–15.5)
WBC: 15.6 10*3/uL — ABNORMAL HIGH (ref 4.0–10.5)
nRBC: 0 % (ref 0.0–0.2)

## 2021-07-07 LAB — URINALYSIS, ROUTINE W REFLEX MICROSCOPIC
Bacteria, UA: NONE SEEN
Bilirubin Urine: NEGATIVE
Glucose, UA: NEGATIVE mg/dL
Hgb urine dipstick: NEGATIVE
Ketones, ur: 20 mg/dL — AB
Leukocytes,Ua: NEGATIVE
Nitrite: NEGATIVE
Protein, ur: 100 mg/dL — AB
Specific Gravity, Urine: 1.019 (ref 1.005–1.030)
pH: 5 (ref 5.0–8.0)

## 2021-07-07 LAB — BASIC METABOLIC PANEL
Anion gap: 14 (ref 5–15)
BUN: 15 mg/dL (ref 8–23)
CO2: 18 mmol/L — ABNORMAL LOW (ref 22–32)
Calcium: 10.1 mg/dL (ref 8.9–10.3)
Chloride: 107 mmol/L (ref 98–111)
Creatinine, Ser: 0.94 mg/dL (ref 0.44–1.00)
GFR, Estimated: >60 mL/min (ref >60)
Glucose, Bld: 201 mg/dL — ABNORMAL HIGH (ref 70–99)
Potassium: 3.5 mmol/L (ref 3.5–5.1)
Sodium: 139 mmol/L (ref 135–145)

## 2021-07-07 NOTE — ED Triage Notes (Addendum)
BIB [facility] after fall from bed. No LOC, unsure of hit head. Abrasion to L knee. Pain to neck, placed in c-collar. No blood thinners. Hx of UTI x 1 year - increasing UTI symptoms over the last 2 days. Confusion at baseline. VSS with EMS.  ?

## 2021-07-07 NOTE — ED Provider Notes (Signed)
?[facility] DEPT ?Provider Note ? ? ?CSN: [id number] ?Arrival date & time: 07/07/21  2114 ? ?  ? ?History ? ?Chief Complaint  ?Patient presents with  ? Fall  ? ? ?Olivia Ross is a 76 y.o. female. ? ?76 year old female with prior medical history as detailed below presents for evaluation.  Patient reports that she fell when she was trying to get out of her bed.  She missed the bed and hit the floor.  She does not think that she hit her head.  She cannot be on a percent sure.  She complains of an abrasion to the left knee.  Family also request that that she be evaluated for possible UTI. ? ?Patient denies chest pain or back pain.  She denies neck pain.  She denies headache.  She denies other acute complaint. ? ?The history is provided by the patient.  ?Fall ?This is a new problem. The current episode started 1 to 2 hours ago. The problem occurs rarely. The problem has not changed since onset.Nothing aggravates the symptoms. Nothing relieves the symptoms.  ? ?  ? ?Home Medications ?Prior to Admission medications   ?Medication Sig Start Date End Date Taking? Authorizing Provider  ?acetaminophen (TYLENOL) 500 MG tablet Take 500 mg by mouth every 6 (six) hours as needed for moderate pain.    [provider]  ?amLODipine (NORVASC) 5 MG tablet Take 5 mg by mouth daily as needed (if blodd pressure is over 120). 11/02/20   [provider]  ?carboxymethylcellulose (REFRESH PLUS) 0.5 % SOLN Place 1 drop into both eyes 3 (three) times daily as needed (dry eyes).    [provider]  ?dicyclomine (BENTYL) 10 MG capsule Take 10 mg by mouth daily as needed for spasms. 07/24/20   [provider]  ?letrozole (FEMARA) 2.5 MG tablet Take 2.5 mg by mouth daily. 12/12/20   [provider]  ?magnesium oxide (MAG-OX) 400 (240 Mg) MG tablet Take 0.5 tablets (200 mg total) by mouth 2 (two) times daily. 12/22/20   Regalado, Belkys A, MD  ?metoprolol tartrate (LOPRESSOR) 25  MG tablet Take 0.5 tablets (12.5 mg total) by mouth 2 (two) times daily. 12/22/20   Regalado, Jerald Kief A, MD  ?Multiple Vitamin (MULTIVITAMIN WITH MINERALS) TABS tablet Take 1 tablet by mouth daily. Centrum    [provider]  ?valsartan (DIOVAN) 160 MG tablet Take 160 mg by mouth daily. 12/12/20   [provider]  ?vancomycin (VANCOCIN) 125 MG capsule Take 1 capsule (125 mg total) by mouth 4 (four) times daily. 04/30/21   Molpus, John, MD  ?vitamin B-12 100 MCG tablet Take 1 tablet (100 mcg total) by mouth daily. 12/23/20   Regalado, Cassie Freer, MD  ?   ? ?Allergies    ?Diphth-acell pertussis-tetanus, Dtap-hepatitis b recomb-ipv, Norco [hydrocodone-acetaminophen], and Percocet [oxycodone-acetaminophen]   ? ?Review of Systems   ?Review of Systems  ?All other systems reviewed and are negative. ? ?Physical Exam ?Updated Vital Signs ?BP (!) 170/85   Pulse 75   Temp 98.7 ?F (37.1 ?C) (Oral)   Resp 20   Ht '5\' 5"'$  (1.651 m)   Wt 65.1 kg   SpO2 100%   BMI 23.88 kg/m?  ?Physical Exam ?Vitals and nursing note reviewed.  ?Constitutional:   ?   General: She is not in acute distress. ?   Appearance: Normal appearance. She is well-developed.  ?HENT:  ?   Head: Normocephalic and atraumatic.  ?Eyes:  ?   Conjunctiva/sclera: Conjunctivae  normal.  ?   Pupils: Pupils are equal, round, and reactive to light.  ?Cardiovascular:  ?   Rate and Rhythm: Normal rate and regular rhythm.  ?   Heart sounds: Normal heart sounds.  ?Pulmonary:  ?   Effort: Pulmonary effort is normal. No respiratory distress.  ?   Breath sounds: Normal breath sounds.  ?Abdominal:  ?   General: There is no distension.  ?   Palpations: Abdomen is soft.  ?   Tenderness: There is no abdominal tenderness.  ?Musculoskeletal:     ?   General: No deformity. Normal range of motion.  ?   Cervical back: Normal range of motion and neck supple.  ?Skin: ?   General: Skin is warm and dry.  ?   Comments: Superficial abrasion to the anterior left knee.   ?Neurological:  ?   General: No focal deficit present.  ?   Mental Status: She is alert. Mental status is at baseline.  ? ? ?ED Results / Procedures / Treatments   ?Labs ?(all labs ordered are listed, but only abnormal results are displayed) ?Labs Reviewed  ?URINALYSIS, ROUTINE W REFLEX MICROSCOPIC - Abnormal; Notable for the following components:  ?    Result Value  ? APPearance HAZY (*)   ? Ketones, ur 20 (*)   ? Protein, ur 100 (*)   ? All other components within normal limits  ?BASIC METABOLIC PANEL - Abnormal; Notable for the following components:  ? CO2 18 (*)   ? Glucose, Bld 201 (*)   ? All other components within normal limits  ?CBC - Abnormal; Notable for the following components:  ? WBC 15.6 (*)   ? All other components within normal limits  ?URINE CULTURE  ? ? ?EKG ?None ? ?Radiology ?DG Pelvis 1-2 Views ? ?Result Date: 07/07/2021 ?CLINICAL DATA:  Status post fall. EXAM: PELVIS - 1-2 VIEW COMPARISON:  December 19, 2020 FINDINGS: There is no evidence of pelvic fracture or diastasis. No pelvic bone lesions are seen. Degenerative changes seen involving both hips, in the form of joint space narrowing and acetabular sclerosis. There is mild to moderate severity vascular calcification. IMPRESSION: Degenerative changes without an acute osseous abnormality. Electronically Signed   By: Virgina Norfolk M.D.   On: 07/07/2021 22:10  ? ?DG Knee 2 Views Left ? ?Result Date: 07/07/2021 ?CLINICAL DATA:  Status post fall. EXAM: LEFT KNEE - 1-2 VIEW COMPARISON:  None. FINDINGS: An ill-defined curvilinear cortical lucency is seen just below the left lateral tibial plateau. There is no evidence of dislocation. Medial and lateral tibiofemoral compartment space narrowing is seen. Moderate severity vascular calcification is noted. IMPRESSION: 1. Findings which may represent a nondisplaced fracture of the left lateral tibial plateau. CT correlation is recommended. 2. Degenerative changes of the left knee. Electronically Signed    By: Virgina Norfolk M.D.   On: 07/07/2021 22:08   ? ?Procedures ?Procedures  ? ? ?Medications Ordered in ED ?Medications - No data to display ? ?ED Course/ Medical Decision Making/ A&P ?  ?                        ?Medical Decision Making ?Amount and/or Complexity of Data Reviewed ?Labs: ordered. ?Radiology: ordered. ? ? ? ?Medical Screen Complete ? ?This patient presented to the ED with complaint of fall. ? ?This complaint involves an extensive number of treatment options. The initial differential diagnosis includes, but is not limited to, injury related to fall, etc. ? ?  This presentation is: Acute, Chronic, Self-Limited, Previously Undiagnosed, Uncertain Prognosis, Complicated, Systemic Symptoms, and Threat to Life/Bodily Function ?Patient is presenting for evaluation after reported fall. ? ?Patient without clear evidence of significant injury related to fall. ? ?Baseline labs are without evidence of significant abnormality. ? ?UA obtained is without evidence of infection. ? ?CT imaging of head and C-spine are without evidence of acute pathology. ? ?Plain films of the left knee are concerning for possible tibial plateau fracture.  Radiology is requesting CT for further examination of the knee.  Patient without symptoms suggestive of left knee tibial plateau fracture. ? ? ?Co morbidities that complicated the patient's evaluation ? ?Advanced age, history of stroke ? ? ?Additional history obtained: ? ?Additional history obtained from EMS ?External records from outside sources obtained and reviewed including prior ED visits and prior Inpatient records.  ? ? ?Lab Tests: ? ?I ordered and personally interpreted labs.  The pertinent results include: CBC, BMP, UA ? ? ?Imaging Studies ordered: ? ?I ordered imaging studies including CT head, CT cervical spine, plain films of pelvis and left knee, CT left knee ?I independently visualized and interpreted obtained imaging which showed nad ?I agree with the radiologist  interpretation. ? ? ?Cardiac Monitoring: ? ?The patient was maintained on a cardiac monitor.  I personally viewed and interpreted the cardiac monitor which showed an underlying rhythm of: NSR ? ? ?Problem List / ED Course: ? ?f

## 2021-07-07 NOTE — Discharge Instructions (Addendum)
You were evaluated in the Emergency Department and after careful evaluation, we did not find any emergent condition requiring admission or further testing in the hospital. ? ?Your exam/testing today was overall reassuring.  X-rays and CT scans did not show any significant injuries. ? ?Please return to the Emergency Department if you experience any worsening of your condition.  Thank you for allowing [place] to be a part of your care. ? ? ? ?

## 2021-07-07 NOTE — ED Provider Notes (Signed)
?  Provider Note ?MRN:  [id number]  ?Arrival date & time: 07/08/21    ?ED Course and Medical Decision Making  ?Assumed care from Dr. Francia Greaves at shift change. ? ?Fall, overall reassuring work-up awaiting CT knee to exclude fracture.  Good range of motion.  Anticipating discharge if normal. ? ?Procedures ? ?Final Clinical Impressions(s) / ED Diagnoses  ? ?  ICD-10-CM   ?1. Fall, initial encounter  W19.Merril Abbe   ?  ?  ?ED Discharge Orders   ? ? None  ? ?  ?  ? ? ?Discharge Instructions   ? ?  ?You were evaluated in the Emergency Department and after careful evaluation, we did not find any emergent condition requiring admission or further testing in the hospital. ? ?Your exam/testing today was overall reassuring.  X-rays and CT scans did not show any significant injuries. ? ?Please return to the Emergency Department if you experience any worsening of your condition.  Thank you for allowing [place] to be a part of your care. ? ? ? ? ? ? ?Barth Kirks. Sedonia Small, MD ?[facility] Emergency Medicine ?[email or web address] ? ?  ?Maudie Flakes, MD ?07/08/21 0030 ? ?

## 2021-07-08 NOTE — ED Notes (Signed)
[facility] called for transport.  

## 2021-07-10 LAB — URINE CULTURE: Culture: 60000 — AB

## 2021-07-11 ENCOUNTER — Telehealth: Payer: Self-pay | Admitting: Emergency Medicine

## 2021-07-11 NOTE — Telephone Encounter (Signed)
Post ED Visit - Positive Culture Follow-up ? ?Culture report reviewed by antimicrobial stewardship pharmacist: ?[place] Team ?'[]'$  Elenor Quinones, Pharm.D. ?'[]'$  Heide Guile, Pharm.D., BCPS AQ-ID ?'[]'$  Parks Neptune, Pharm.D., BCPS ?'[]'$  Alycia Rossetti, Pharm.D., BCPS ?'[]'$  [place], Pharm.D., BCPS, AAHIVP ?'[]'$  Legrand Como, Pharm.D., BCPS, AAHIVP ?'[]'$  Salome Arnt, PharmD, BCPS ?'[]'$  Johnnette Gourd, PharmD, BCPS ?'[]'$  Hughes Better, PharmD, BCPS ?'[]'$  Leeroy Cha, PharmD ?'[]'$  Laqueta Linden, PharmD, BCPS ?'[]'$  Albertina Parr, PharmD ? ?[place] Team ?'[]'$  Leodis Sias, PharmD ?'[]'$  Lindell Spar, PharmD ?'[]'$  Royetta Asal, PharmD ?'[]'$  Graylin Shiver, Rph ?'[]'$  Rema Fendt) Glennon Mac, PharmD ?'[]'$  Arlyn Dunning, PharmD ?'[]'$  Netta Cedars, PharmD ?'[]'$  Dia Sitter, PharmD ?'[]'$  Leone Haven, PharmD ?'[]'$  Gretta Arab, PharmD ?'[]'$  Theodis Shove, PharmD ?'[]'$  Peggyann Juba, PharmD ?'[]'$  Reuel Boom, PharmD ?Jimmy Footman PharmD ? ? ?Positive urine culture ?Treated with none, asymptomatic, no further patient follow-up is required at this time. ? ?Olivia Ross ?07/11/2021, 10:12 AM ?  ?

## 2021-07-11 NOTE — Progress Notes (Signed)
ED Antimicrobial Stewardship Positive Culture Follow Up  ? ?Olivia Ross is an 76 y.o. female who presented to [facility] on 07/07/2021 with a chief complaint of  ?Chief Complaint  ?Patient presents with  ? Fall  ? ? ?Recent Results (from the past 720 hour(s))  ?Urine Culture     Status: Abnormal  ? Collection Time: 07/07/21  9:27 PM  ? Specimen: In/Out Cath Urine  ?Result Value Ref Range Status  ? Specimen Description   Final  ?  IN/OUT CATH URINE ?Performed at [facility], [place] [address], [place], [place] [address] ?  ? Special Requests   Final  ?  NONE ?Performed at [facility], [place] [address] [place], [place] [address] ?  ? Culture 60,000 COLONIES/mL ESCHERICHIA COLI (A)  Final  ? Report Status 07/10/2021 FINAL  Final  ? Organism ID, Bacteria ESCHERICHIA COLI (A)  Final  ?    Susceptibility  ? Escherichia coli - MIC*  ?  AMPICILLIN <=2 SENSITIVE Sensitive   ?  CEFAZOLIN <=4 SENSITIVE Sensitive   ?  CEFEPIME <=0.12 SENSITIVE Sensitive   ?  CEFTRIAXONE <=0.25 SENSITIVE Sensitive   ?  CIPROFLOXACIN <=0.25 SENSITIVE Sensitive   ?  GENTAMICIN <=1 SENSITIVE Sensitive   ?  IMIPENEM <=0.25 SENSITIVE Sensitive   ?  NITROFURANTOIN <=16 SENSITIVE Sensitive   ?  TRIMETH/SULFA <=20 SENSITIVE Sensitive   ?  AMPICILLIN/SULBACTAM <=2 SENSITIVE Sensitive   ?  PIP/TAZO <=4 SENSITIVE Sensitive   ?  * 60,000 COLONIES/mL ESCHERICHIA COLI  ? ? ?Patient asymptomatic. Insignificant growth. Do not treat.  ? ?ED Provider: Dene Gentry, MD  ? ? ?Jimmy Footman, PharmD, BCPS, BCIDP ?Infectious Diseases Clinical Pharmacist ?Phone: [phone] ?07/11/2021, 9:12 AM ? ?

## 2021-08-06 ENCOUNTER — Emergency Department (HOSPITAL_COMMUNITY): Payer: Medicare PPO

## 2021-08-06 ENCOUNTER — Emergency Department (HOSPITAL_COMMUNITY)
Admission: EM | Admit: 2021-08-06 | Discharge: 2021-08-07 | Disposition: A | Discharge status: Home / Self Care | Payer: Medicare PPO | Attending: Emergency Medicine | Admitting: Emergency Medicine

## 2021-08-06 ENCOUNTER — Other Ambulatory Visit: Payer: Self-pay

## 2021-08-06 DIAGNOSIS — B9689 Other specified bacterial agents as the cause of diseases classified elsewhere: Secondary | ICD-10-CM | POA: Insufficient documentation

## 2021-08-06 DIAGNOSIS — I10 Essential (primary) hypertension: Secondary | ICD-10-CM | POA: Insufficient documentation

## 2021-08-06 DIAGNOSIS — D500 Iron deficiency anemia secondary to blood loss (chronic): Secondary | ICD-10-CM | POA: Insufficient documentation

## 2021-08-06 DIAGNOSIS — N390 Urinary tract infection, site not specified: Secondary | ICD-10-CM | POA: Diagnosis not present

## 2021-08-06 DIAGNOSIS — R309 Painful micturition, unspecified: Secondary | ICD-10-CM | POA: Diagnosis present

## 2021-08-06 DIAGNOSIS — Z79899 Other long term (current) drug therapy: Secondary | ICD-10-CM | POA: Insufficient documentation

## 2021-08-06 DIAGNOSIS — F0390 Unspecified dementia without behavioral disturbance: Secondary | ICD-10-CM | POA: Insufficient documentation

## 2021-08-06 DIAGNOSIS — R195 Other fecal abnormalities: Secondary | ICD-10-CM | POA: Insufficient documentation

## 2021-08-06 LAB — CBC WITH DIFFERENTIAL/PLATELET
Abs Immature Granulocytes: 0.02 10*3/uL (ref 0.00–0.07)
Basophils Absolute: 0 10*3/uL (ref 0.0–0.1)
Basophils Relative: 0 %
Eosinophils Absolute: 0 10*3/uL (ref 0.0–0.5)
Eosinophils Relative: 0 %
HCT: 27.9 % — ABNORMAL LOW (ref 36.0–46.0)
Hemoglobin: 8.8 g/dL — ABNORMAL LOW (ref 12.0–15.0)
Immature Granulocytes: 0 %
Lymphocytes Relative: 8 %
Lymphs Abs: 0.7 10*3/uL (ref 0.7–4.0)
MCH: 31.3 pg (ref 26.0–34.0)
MCHC: 31.5 g/dL (ref 30.0–36.0)
MCV: 99.3 fL (ref 80.0–100.0)
Monocytes Absolute: 0.4 10*3/uL (ref 0.1–1.0)
Monocytes Relative: 5 %
Neutro Abs: 7.4 10*3/uL (ref 1.7–7.7)
Neutrophils Relative %: 87 %
Platelets: 168 10*3/uL (ref 150–400)
RBC: 2.81 MIL/uL — ABNORMAL LOW (ref 3.87–5.11)
RDW: 15.4 % (ref 11.5–15.5)
WBC: 8.6 10*3/uL (ref 4.0–10.5)
nRBC: 0 % (ref 0.0–0.2)

## 2021-08-06 LAB — URINALYSIS, ROUTINE W REFLEX MICROSCOPIC
Bilirubin Urine: NEGATIVE
Glucose, UA: NEGATIVE mg/dL
Hgb urine dipstick: NEGATIVE
Ketones, ur: 20 mg/dL — AB
Nitrite: NEGATIVE
Protein, ur: 30 mg/dL — AB
Specific Gravity, Urine: 1.016 (ref 1.005–1.030)
WBC, UA: >50 WBC/hpf — ABNORMAL HIGH (ref 0–5)
pH: 5 (ref 5.0–8.0)

## 2021-08-06 LAB — COMPREHENSIVE METABOLIC PANEL
ALT: 12 U/L (ref 0–44)
AST: 18 U/L (ref 15–41)
Albumin: 3 g/dL — ABNORMAL LOW (ref 3.5–5.0)
Alkaline Phosphatase: 57 U/L (ref 38–126)
Anion gap: 8 (ref 5–15)
BUN: 12 mg/dL (ref 8–23)
CO2: 22 mmol/L (ref 22–32)
Calcium: 9.4 mg/dL (ref 8.9–10.3)
Chloride: 112 mmol/L — ABNORMAL HIGH (ref 98–111)
Creatinine, Ser: 0.78 mg/dL (ref 0.44–1.00)
GFR, Estimated: >60 mL/min (ref >60)
Glucose, Bld: 110 mg/dL — ABNORMAL HIGH (ref 70–99)
Potassium: 3.5 mmol/L (ref 3.5–5.1)
Sodium: 142 mmol/L (ref 135–145)
Total Bilirubin: 1 mg/dL (ref 0.3–1.2)
Total Protein: 5.6 g/dL — ABNORMAL LOW (ref 6.5–8.1)

## 2021-08-06 MED ORDER — CEFTRIAXONE SODIUM 2 G IJ SOLR
2.0000 g | Freq: Once | INTRAMUSCULAR | Status: AC
  Administered 2021-08-06: 2 g via INTRAVENOUS
  Filled 2021-08-06: qty 20

## 2021-08-06 NOTE — ED Triage Notes (Signed)
BIB [facility] from home with c/o of possible UTI. Daughter says that pt has been kinda staring off, not acting like her normal self.  ? ?Hx of dementia.  ?

## 2021-08-06 NOTE — ED Provider Notes (Signed)
?[facility] DEPT ?Provider Note ? ? ?CSN: [id number] ?Arrival date & time: 08/06/21  2218 ? ?  ? ?History ? ?Chief Complaint  ?Patient presents with  ? Dysuria  ? ? ?Olivia Ross is a 76 y.o. female. ? ?The history is provided by the patient and a relative. The history is limited by the condition of the patient (Dementia).  ?Dysuria ?She has history of hypertension, stroke, dementia and was brought in by her daughter because she was having pain with urinating today and daughter stated that she was not quite acting right.  She does have a history of UTIs.  There has been no known fever or chills.  There has been no cough.  There has been no vomiting or diarrhea. ?  ?Home Medications ?Prior to Admission medications   ?Medication Sig Start Date End Date Taking? Authorizing Provider  ?acetaminophen (TYLENOL) 500 MG tablet Take 500 mg by mouth every 6 (six) hours as needed for moderate pain.    [provider]  ?amLODipine (NORVASC) 5 MG tablet Take 5 mg by mouth daily as needed (if blodd pressure is over 120). 11/02/20   [provider]  ?carboxymethylcellulose (REFRESH PLUS) 0.5 % SOLN Place 1 drop into both eyes 3 (three) times daily as needed (dry eyes).    [provider]  ?dicyclomine (BENTYL) 10 MG capsule Take 10 mg by mouth daily as needed for spasms. 07/24/20   [provider]  ?letrozole (FEMARA) 2.5 MG tablet Take 2.5 mg by mouth daily. 12/12/20   [provider]  ?magnesium oxide (MAG-OX) 400 (240 Mg) MG tablet Take 0.5 tablets (200 mg total) by mouth 2 (two) times daily. 12/22/20   Regalado, Belkys A, MD  ?metoprolol tartrate (LOPRESSOR) 25 MG tablet Take 0.5 tablets (12.5 mg total) by mouth 2 (two) times daily. 12/22/20   Regalado, Jerald Kief A, MD  ?Multiple Vitamin (MULTIVITAMIN WITH MINERALS) TABS tablet Take 1 tablet by mouth daily. Centrum    [provider]  ?valsartan (DIOVAN) 160 MG tablet Take 160 mg by mouth daily. 12/12/20    [provider]  ?vancomycin (VANCOCIN) 125 MG capsule Take 1 capsule (125 mg total) by mouth 4 (four) times daily. 04/30/21   Molpus, John, MD  ?vitamin B-12 100 MCG tablet Take 1 tablet (100 mcg total) by mouth daily. 12/23/20   Regalado, Cassie Freer, MD  ?   ? ?Allergies    ?Diphth-acell pertussis-tetanus, Dtap-hepatitis b recomb-ipv, Norco [hydrocodone-acetaminophen], and Percocet [oxycodone-acetaminophen]   ? ?Review of Systems   ?Review of Systems  ?Unable to perform ROS: Dementia  ?Genitourinary:  Positive for dysuria.  ? ?Physical Exam ?Updated Vital Signs ?BP (!) 148/59   Pulse 66   Temp 97.6 ?F (36.4 ?C) (Oral)   Resp 20   Ht '5\' 5"'$  (1.324 m)   Wt 65.1 kg   SpO2 100%   BMI 23.88 kg/m?  ?Physical Exam ?Vitals and nursing note reviewed. Exam conducted with a chaperone present.  ?76 year old female, resting comfortably and in no acute distress. Vital signs are significant for elevated blood pressure. Oxygen saturation is 100%, which is normal. ?Head is normocephalic and atraumatic. PERRLA, EOMI. Oropharynx is clear. ?Neck is nontender and supple without adenopathy or JVD. ?Back is nontender and there is no CVA tenderness. ?Lungs are clear without rales, wheezes, or rhonchi. ?Chest is nontender. ?Heart has regular rate and rhythm without murmur. ?Abdomen is soft, flat, nontender. ?Rectal: Normal sphincter tone.  Moderate amount of brown stool  which is trace Hemoccult positive. ?Extremities have no cyanosis or edema, full range of motion is present. ?Skin is warm and dry without rash. ?Neurologic: Awake and alert, oriented to person and place but not time, cranial nerves are intact, moves all extremities equally. ? ?ED Results / Procedures / Treatments   ?Labs ?(all labs ordered are listed, but only abnormal results are displayed) ?Labs Reviewed  ?URINALYSIS, ROUTINE W REFLEX MICROSCOPIC - Abnormal; Notable for the following components:  ?    Result Value  ? Color, Urine AMBER (*)   ? APPearance HAZY  (*)   ? Ketones, ur 20 (*)   ? Protein, ur 30 (*)   ? Leukocytes,Ua MODERATE (*)   ? WBC, UA >50 (*)   ? Bacteria, UA MANY (*)   ? All other components within normal limits  ?COMPREHENSIVE METABOLIC PANEL - Abnormal; Notable for the following components:  ? Chloride 112 (*)   ? Glucose, Bld 110 (*)   ? Total Protein 5.6 (*)   ? Albumin 3.0 (*)   ? All other components within normal limits  ?CBC WITH DIFFERENTIAL/PLATELET - Abnormal; Notable for the following components:  ? RBC 2.81 (*)   ? Hemoglobin 8.8 (*)   ? HCT 27.9 (*)   ? All other components within normal limits  ?POC OCCULT BLOOD, ED - Abnormal; Notable for the following components:  ? Fecal Occult Bld POSITIVE (*)   ? All other components within normal limits  ?URINE CULTURE  ?VITAMIN B12  ?FOLATE  ?IRON AND TIBC  ?FERRITIN  ?RETICULOCYTES  ? ?Radiology ?DG Chest Port 1 View ? ?Result Date: 08/06/2021 ?CLINICAL DATA:  Altered mental status. EXAM: PORTABLE CHEST 1 VIEW COMPARISON:  02/03/2021 FINDINGS: The heart is normal in size. Stable mediastinal contours with aortic atherosclerosis. The lungs are clear. Pulmonary vasculature is normal. No consolidation, pleural effusion, or pneumothorax. No acute osseous abnormalities are seen. Right axillary and bilateral chest wall surgical clips. IMPRESSION: No acute chest finding. Electronically Signed   By: Keith Rake M.D.   On: 08/06/2021 23:37   ? ?Procedures ?Procedures  ? ? ?Medications Ordered in ED ?Medications  ?cefTRIAXone (ROCEPHIN) 2 g in sodium chloride 0.9 % 100 mL IVPB (2 g Intravenous New Bag/Given 08/06/21 2330)  ? ? ?ED Course/ Medical Decision Making/ A&P ?  ?                        ?Medical Decision Making ?Amount and/or Complexity of Data Reviewed ?Labs: ordered. ?Radiology: ordered. ? ? ?Altered mentation with urinary symptoms, probable UTI.  Also need to look for occult infection, will will check chest x-ray.  We will also check electrolytes and blood sugar and renal function.  Old records  are reviewed, and she does have history of ED visits for UTI as well as admissions for urinary sepsis. ? ?Urinalysis does show evidence of infection with greater than 50 WBCs and many bacteria.  Urine is sent for culture and she is given a dose of ceftriaxone. ? ?Chest x-ray shows no acute process.  I have independently viewed the images, and agree with radiologist's interpretation.  Metabolic panel is unremarkable.  CBC shows hemoglobin of 8.8, which is a 3.6 g drop compared with 07/07/2021.  Rectal exam was done to obtain stool sample and stool is trace Hemoccult positive.  Blood is sent for anemia panel.  Review of her medications shows that she is not on any NSAIDs or anticoagulants.  As  patient is hemodynamically stable and not actively bleeding, it is felt that she can be discharged with being placed on iron and follow-up with her gastroenterologist.  She is also discharged with a prescription for cephalexin for her urinary tract infection.  Return precautions are discussed. ? ?Final Clinical Impression(s) / ED Diagnoses ?Final diagnoses:  ?Urinary tract infection without hematuria, site unspecified  ?Anemia due to blood loss  ?Guaiac positive stools  ? ? ?Rx / DC Orders ?ED Discharge Orders   ? ?      Ordered  ?  cephALEXin (KEFLEX) 500 MG capsule  3 times daily       ? 08/07/21 0015  ?  ferrous sulfate 325 (65 FE) MG tablet  Daily       ? 08/07/21 0015  ? ?  ?  ? ?  ? ? ?  ?Delora Fuel, MD ?14/83/07 0018 ? ?

## 2021-08-07 LAB — RETICULOCYTES
Immature Retic Fract: 12.8 % (ref 2.3–15.9)
RBC.: 2.81 MIL/uL — ABNORMAL LOW (ref 3.87–5.11)
Retic Count, Absolute: 42.2 10*3/uL (ref 19.0–186.0)
Retic Ct Pct: 1.5 % (ref 0.4–3.1)

## 2021-08-07 LAB — IRON AND TIBC
Iron: 30 ug/dL (ref 28–170)
Saturation Ratios: 14 % (ref 10.4–31.8)
TIBC: 210 ug/dL — ABNORMAL LOW (ref 250–450)
UIBC: 180 ug/dL

## 2021-08-07 LAB — VITAMIN B12: Vitamin B-12: 992 pg/mL — ABNORMAL HIGH (ref 180–914)

## 2021-08-07 LAB — FOLATE: Folate: 17.9 ng/mL (ref >5.9)

## 2021-08-07 LAB — POC OCCULT BLOOD, ED: Fecal Occult Bld: POSITIVE — AB

## 2021-08-07 LAB — FERRITIN: Ferritin: 78 ng/mL (ref 11–307)

## 2021-08-07 MED ORDER — FERROUS SULFATE 325 (65 FE) MG PO TABS: 325.0000 mg | ORAL_TABLET | Freq: Every day | ORAL | 0 refills | Status: DC

## 2021-08-07 MED ORDER — CEPHALEXIN 500 MG PO CAPS: 500.0000 mg | ORAL_CAPSULE | Freq: Three times a day (TID) | ORAL | 0 refills | Status: DC

## 2021-08-07 NOTE — Discharge Instructions (Addendum)
You have developed an anemia, and need to be evaluated by your gastroenterologist to see if we can find the source of internal bleeding.  That appointment needs to be made as soon as possible.  In the meantime, if you have any problems such as weakness or dizziness or start passing blood or black stools, then return to the emergency department. ?

## 2021-08-09 LAB — URINE CULTURE: Culture: >=100000 — AB

## 2021-08-10 ENCOUNTER — Telehealth: Payer: Self-pay

## 2021-08-10 NOTE — Telephone Encounter (Signed)
Post ED Visit - Positive Culture Follow-up ? ?Culture report reviewed by antimicrobial stewardship pharmacist: ?[facility] Team ?'[]'$  Elenor Quinones, Pharm.D. ?'[]'$  Heide Guile, Pharm.D., BCPS AQ-ID ?'[]'$  Parks Neptune, Pharm.D., BCPS ?'[x]'$  Alycia Rossetti, Pharm.D., BCPS ?'[]'$  Sterling, Pharm.D., BCPS, AAHIVP ?'[]'$  Legrand Como, Pharm.D., BCPS, AAHIVP ?'[]'$  Salome Arnt, PharmD, BCPS ?'[]'$  Johnnette Gourd, PharmD, BCPS ?'[]'$  Hughes Better, PharmD, BCPS ?'[]'$  Leeroy Cha, PharmD ?'[]'$  Laqueta Linden, PharmD, BCPS ?'[]'$  Albertina Parr, PharmD ? ?[facility] Team ?'[]'$  Leodis Sias, PharmD ?'[]'$  Lindell Spar, PharmD ?'[]'$  Royetta Asal, PharmD ?'[]'$  Graylin Shiver, Rph ?'[]'$  Rema Fendt) Olivia Mac, PharmD ?'[]'$  Arlyn Dunning, PharmD ?'[]'$  Netta Cedars, PharmD ?'[]'$  Dia Sitter, PharmD ?'[]'$  Leone Haven, PharmD ?'[]'$  Gretta Arab, PharmD ?'[]'$  Theodis Shove, PharmD ?'[]'$  Peggyann Juba, PharmD ?'[]'$  Reuel Boom, PharmD ? ? ?Positive urine culture ?Treated with Cephalexin, organism sensitive to the same and no further patient follow-up is required at this time. ? ?Olivia Ross ?08/10/2021, 8:47 AM ?  ?

## 2021-08-26 ENCOUNTER — Observation Stay (HOSPITAL_COMMUNITY)
Admission: EM | Admit: 2021-08-26 | Discharge: 2021-08-28 | Disposition: A | Discharge status: Home Health | Payer: Medicare PPO | Attending: Internal Medicine | Admitting: Internal Medicine

## 2021-08-26 ENCOUNTER — Emergency Department (HOSPITAL_COMMUNITY): Payer: Medicare PPO

## 2021-08-26 ENCOUNTER — Other Ambulatory Visit: Payer: Self-pay

## 2021-08-26 ENCOUNTER — Encounter (HOSPITAL_COMMUNITY): Payer: Self-pay

## 2021-08-26 DIAGNOSIS — R001 Bradycardia, unspecified: Secondary | ICD-10-CM

## 2021-08-26 DIAGNOSIS — Z79899 Other long term (current) drug therapy: Secondary | ICD-10-CM | POA: Insufficient documentation

## 2021-08-26 DIAGNOSIS — F0390 Unspecified dementia without behavioral disturbance: Secondary | ICD-10-CM | POA: Diagnosis not present

## 2021-08-26 DIAGNOSIS — I10 Essential (primary) hypertension: Secondary | ICD-10-CM | POA: Diagnosis not present

## 2021-08-26 DIAGNOSIS — Z853 Personal history of malignant neoplasm of breast: Secondary | ICD-10-CM | POA: Diagnosis not present

## 2021-08-26 DIAGNOSIS — R2681 Unsteadiness on feet: Secondary | ICD-10-CM | POA: Insufficient documentation

## 2021-08-26 DIAGNOSIS — R55 Syncope and collapse: Secondary | ICD-10-CM

## 2021-08-26 DIAGNOSIS — M6281 Muscle weakness (generalized): Secondary | ICD-10-CM | POA: Diagnosis not present

## 2021-08-26 DIAGNOSIS — R4182 Altered mental status, unspecified: Secondary | ICD-10-CM

## 2021-08-26 DIAGNOSIS — R404 Transient alteration of awareness: Principal | ICD-10-CM | POA: Diagnosis present

## 2021-08-26 LAB — MAGNESIUM: Magnesium: 1.4 mg/dL — ABNORMAL LOW (ref 1.7–2.4)

## 2021-08-26 LAB — COMPREHENSIVE METABOLIC PANEL
ALT: 13 U/L (ref 0–44)
AST: 23 U/L (ref 15–41)
Albumin: 3.3 g/dL — ABNORMAL LOW (ref 3.5–5.0)
Alkaline Phosphatase: 61 U/L (ref 38–126)
Anion gap: 9 (ref 5–15)
BUN: 8 mg/dL (ref 8–23)
CO2: 19 mmol/L — ABNORMAL LOW (ref 22–32)
Calcium: 10.1 mg/dL (ref 8.9–10.3)
Chloride: 107 mmol/L (ref 98–111)
Creatinine, Ser: 0.91 mg/dL (ref 0.44–1.00)
GFR, Estimated: >60 mL/min (ref >60)
Glucose, Bld: 109 mg/dL — ABNORMAL HIGH (ref 70–99)
Potassium: 4.1 mmol/L (ref 3.5–5.1)
Sodium: 135 mmol/L (ref 135–145)
Total Bilirubin: 0.7 mg/dL (ref 0.3–1.2)
Total Protein: 5.9 g/dL — ABNORMAL LOW (ref 6.5–8.1)

## 2021-08-26 LAB — URINALYSIS, ROUTINE W REFLEX MICROSCOPIC
Bilirubin Urine: NEGATIVE
Glucose, UA: NEGATIVE mg/dL
Hgb urine dipstick: NEGATIVE
Ketones, ur: 5 mg/dL — AB
Leukocytes,Ua: NEGATIVE
Nitrite: NEGATIVE
Protein, ur: NEGATIVE mg/dL
Specific Gravity, Urine: 1.014 (ref 1.005–1.030)
pH: 6 (ref 5.0–8.0)

## 2021-08-26 LAB — CBC
HCT: 40.8 % (ref 36.0–46.0)
Hemoglobin: 12.7 g/dL (ref 12.0–15.0)
MCH: 31.3 pg (ref 26.0–34.0)
MCHC: 31.1 g/dL (ref 30.0–36.0)
MCV: 100.5 fL — ABNORMAL HIGH (ref 80.0–100.0)
Platelets: 179 10*3/uL (ref 150–400)
RBC: 4.06 MIL/uL (ref 3.87–5.11)
RDW: 14.5 % (ref 11.5–15.5)
WBC: 10.6 10*3/uL — ABNORMAL HIGH (ref 4.0–10.5)
nRBC: 0 % (ref 0.0–0.2)

## 2021-08-26 LAB — TSH: TSH: 3.664 u[IU]/mL (ref 0.350–4.500)

## 2021-08-26 LAB — CBG MONITORING, ED: Glucose-Capillary: 86 mg/dL (ref 70–99)

## 2021-08-26 LAB — TROPONIN I (HIGH SENSITIVITY): Troponin I (High Sensitivity): 12 ng/L (ref <18)

## 2021-08-26 MED ORDER — GADOBUTROL 1 MMOL/ML IV SOLN
7.0000 mL | Freq: Once | INTRAVENOUS | Status: AC | PRN
  Administered 2021-08-26: 7 mL via INTRAVENOUS

## 2021-08-26 MED ORDER — LORAZEPAM 2 MG/ML IJ SOLN
0.5000 mg | Freq: Once | INTRAMUSCULAR | Status: DC
Start: 2021-08-26 — End: 2021-08-27

## 2021-08-26 MED ORDER — HYDRALAZINE HCL 20 MG/ML IJ SOLN
10.0000 mg | Freq: Once | INTRAMUSCULAR | Status: AC
  Administered 2021-08-26: 10 mg via INTRAVENOUS
  Filled 2021-08-26: qty 1

## 2021-08-26 MED ORDER — AMLODIPINE BESYLATE 5 MG PO TABS
5.0000 mg | ORAL_TABLET | Freq: Once | ORAL | Status: AC
  Administered 2021-08-26: 5 mg via ORAL
  Filled 2021-08-26: qty 1

## 2021-08-26 NOTE — ED Notes (Signed)
Daughter reports patient has not taken her BP medication today.

## 2021-08-26 NOTE — ED Notes (Signed)
Patient returned from MRI.

## 2021-08-26 NOTE — ED Triage Notes (Addendum)
Patient arrived by [facility] following a 3 minute period at home where she was non-verbal and not following commands. Patient quickly returned to normal baseline and on arrival alert and oriented to baseline. Patient alert to person and place. Strong smell of urine. Confused to time . When patient placed on cardiac monitor noted to be in a bradycardiac rhythm/ patient with HR 30s, denies CP. EMS did not report any brady rhythms during transport

## 2021-08-26 NOTE — Assessment & Plan Note (Signed)
New. HR in the 40-50s. Pt on lopressor at home. Will hold lopressor. May need different HTN meds that don't cause bradycardia. Add prn hydralazine 10 mg q4h prn SBP >170

## 2021-08-26 NOTE — H&P (Signed)
History and Physical    Olivia Ross OJJ:[id number] DOB: 07-11-1945 DOA: 08/26/2021  DOS: the patient was seen and examined on 08/26/2021  PCP: Jolinda Croak, MD   Patient coming from: Home  I have personally briefly reviewed patient's old medical records in [place]  CC: altered mental status HPI: 76 year old African-American female history of hypertension, dementia who presents to the ER today with altered consciousness.  Daughter had reported to EDP that she witnessed about a 3-minute.  Patient was nonverbal and not following commands.  Patient's eyes were open and awake.  She then became more aware and was returned back to her baseline.  Patient brought to the ER for evaluation.  MRI of the brain was negative for acute stroke.  EDP discussed the case with neurology.  MRI does show prior strokes with encephalomalacia of the right posterior MCA territory in addition to the left frontal left pons.  Neurology was concern for possible seizure etiology as a cause for altered mental status.  They recommended hospital admission.  Labs: Sodium 135 BUN of 8 creatinine 0.9 White count 10.6 hemoglobin 12.7 platelets of 179 UA negative  EKG which I personally interpreted shows sinus bradycardia with with a PVC.  [facility] hospitalist contacted for admission.   ED Course: MRI brain negative for acute stroke.  Shows old encephalomalacia.  Review of Systems:  Review of Systems  Unable to perform ROS: Dementia   Past Medical History:  Diagnosis Date   Breast cancer ([place])    Dementia ([place])    Hypertension     History reviewed. No pertinent surgical history.   reports that she has never smoked. She has never used smokeless tobacco. She reports that she does not drink alcohol and does not use drugs.  Allergies  Allergen Reactions   Diphth-Acell Pertussis-Tetanus     Other reaction(s): Other (See Comments)   Dtap-Hepatitis B Recomb-Ipv Other (See Comments)    unknown    Norco [Hydrocodone-Acetaminophen] Nausea And Vomiting   Percocet [Oxycodone-Acetaminophen] Nausea And Vomiting    No family history on file.  Prior to Admission medications   Medication Sig Start Date End Date Taking? Authorizing Provider  acetaminophen (TYLENOL) 500 MG tablet Take 500 mg by mouth every 6 (six) hours as needed for moderate pain.    [provider]  amLODipine (NORVASC) 5 MG tablet Take 5 mg by mouth daily as needed (if blodd pressure is over 120). 11/02/20   [provider]  carboxymethylcellulose (REFRESH PLUS) 0.5 % SOLN Place 1 drop into both eyes 3 (three) times daily as needed (dry eyes).    [provider]  cephALEXin (KEFLEX) 500 MG capsule Take 1 capsule (500 mg total) by mouth 3 (three) times daily. 12/02/69   Delora Fuel, MD  dicyclomine (BENTYL) 10 MG capsule Take 10 mg by mouth daily as needed for spasms. 07/24/20   [provider]  ferrous sulfate 325 (65 FE) MG tablet Take 1 tablet (325 mg total) by mouth daily. 09/07/65   Delora Fuel, MD  letrozole [employer]) 2.5 MG tablet Take 2.5 mg by mouth daily. 12/12/20   [provider]  magnesium oxide (MAG-OX) 400 (240 Mg) MG tablet Take 0.5 tablets (200 mg total) by mouth 2 (two) times daily. 12/22/20   Regalado, Belkys A, MD  metoprolol tartrate (LOPRESSOR) 25 MG tablet Take 0.5 tablets (12.5 mg total) by mouth 2 (two) times daily. 12/22/20   Regalado, Jerald Kief A, MD  Multiple Vitamin (MULTIVITAMIN WITH MINERALS) TABS tablet  Take 1 tablet by mouth daily. Centrum    [provider]  valsartan (DIOVAN) 160 MG tablet Take 160 mg by mouth daily. 12/12/20   [provider]  vancomycin (VANCOCIN) 125 MG capsule Take 1 capsule (125 mg total) by mouth 4 (four) times daily. 04/30/21   Molpus, John, MD  vitamin B-12 100 MCG tablet Take 1 tablet (100 mcg total) by mouth daily. 12/23/20   Elmarie Shiley, MD    Physical Exam: Vitals:   08/26/21 1800 08/26/21 1915 08/26/21 2000  08/26/21 2015  BP: (!) 192/69 (!) 179/87 (!) 188/72 (!) 184/71  Pulse:  (!) 58 63 (!) 53  Resp: '17 18 14 15  '$ Temp:      TempSrc:      SpO2: 92% 100% 100% 100%  Weight:      Height:        Physical Exam Vitals and nursing note reviewed.  Constitutional:      General: She is not in acute distress.    Appearance: She is not ill-appearing, toxic-appearing or diaphoretic.     Comments: Pleasantly demented  HENT:     Head: Normocephalic and atraumatic.     Nose: Nose normal. No rhinorrhea.  Cardiovascular:     Rate and Rhythm: Regular rhythm. Bradycardia present.     Pulses: Normal pulses.  Pulmonary:     Effort: Pulmonary effort is normal. No respiratory distress.     Breath sounds: No wheezing or rales.  Abdominal:     General: Abdomen is flat. Bowel sounds are normal. There is no distension.     Tenderness: There is no abdominal tenderness. There is no guarding or rebound.  Musculoskeletal:     Right lower leg: No edema.     Left lower leg: No edema.  Skin:    General: Skin is warm and dry.     Capillary Refill: Capillary refill takes less than 2 seconds.  Neurological:     General: No focal deficit present.     Mental Status: She is alert. She is disoriented.     Labs on Admission: I have personally reviewed following labs and imaging studies  CBC: Recent Labs  Lab 08/26/21 1455  WBC 10.6*  HGB 12.7  HCT 40.8  MCV 100.5*  PLT 825   Basic Metabolic Panel: Recent Labs  Lab 08/26/21 1455  NA 135  K 4.1  CL 107  CO2 19*  GLUCOSE 109*  BUN 8  CREATININE 0.91  CALCIUM 10.1   GFR: Estimated Creatinine Clearance: 50.9 mL/min (by C-G formula based on SCr of 0.91 mg/dL). Liver Function Tests: Recent Labs  Lab 08/26/21 1455  AST 23  ALT 13  ALKPHOS 61  BILITOT 0.7  PROT 5.9*  ALBUMIN 3.3*   No results for input(s): LIPASE, AMYLASE in the last 168 hours. No results for input(s): AMMONIA in the last 168 hours. Coagulation Profile: No results for  input(s): INR, PROTIME in the last 168 hours. Cardiac Enzymes: No results for input(s): CKTOTAL, CKMB, CKMBINDEX, TROPONINI, TROPONINIHS in the last 168 hours. BNP (last 3 results) No results for input(s): PROBNP in the last 8760 hours. HbA1C: No results for input(s): HGBA1C in the last 72 hours. CBG: Recent Labs  Lab 08/26/21 1525  GLUCAP 86   Lipid Profile: No results for input(s): CHOL, HDL, LDLCALC, TRIG, CHOLHDL, LDLDIRECT in the last 72 hours. Thyroid Function Tests: No results for input(s): TSH, T4TOTAL, FREET4, T3FREE, THYROIDAB in the last 72 hours. Anemia Panel: No results  for input(s): VITAMINB12, FOLATE, FERRITIN, TIBC, IRON, RETICCTPCT in the last 72 hours. Urine analysis:    Component Value Date/Time   COLORURINE YELLOW 08/26/2021 1736   APPEARANCEUR CLEAR 08/26/2021 1736   LABSPEC 1.014 08/26/2021 1736   PHURINE 6.0 08/26/2021 1736   GLUCOSEU NEGATIVE 08/26/2021 1736   HGBUR NEGATIVE 08/26/2021 1736   BILIRUBINUR NEGATIVE 08/26/2021 1736   KETONESUR 5 (A) 08/26/2021 1736   PROTEINUR NEGATIVE 08/26/2021 1736   NITRITE NEGATIVE 08/26/2021 1736   LEUKOCYTESUR NEGATIVE 08/26/2021 1736    Radiological Exams on Admission: I have personally reviewed images MR Brain W and Wo Contrast  Result Date: 08/26/2021 CLINICAL DATA:  Neuro deficit, acute, stroke suspected EXAM: MRI HEAD WITHOUT AND WITH CONTRAST TECHNIQUE: Multiplanar, multiecho pulse sequences of the brain and surrounding structures were obtained without and with intravenous contrast. CONTRAST:  85m GADAVIST GADOBUTROL 1 MMOL/ML IV SOLN COMPARISON:  CT head April 8, 23. FINDINGS: Brain: No acute infarction, acute hemorrhage, hydrocephalus, extra-axial collection or mass lesion. 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 Additional remote left frontal, left pons, and bilateral cerebellar infarcts. Additional patchy T2/FLAIR hyperintensities in the white  matter, nonspecific but compatible with chronic microvascular disease. No pathologic enhancement. Cerebral atrophy. Vascular: Major arterial flow voids are maintained at the skull base. Poor visualization of the right MCA flow void, probably chronic given above findings. 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 Skull and upper cervical spine: Normal marrow signal. Sinuses/Orbits: Clear sinuses.  No acute orbital findings. Other: No mastoid effusions. IMPRESSION: 1. No evidence of acute intracranial abnormality. 2. Multiple remote infarcts, described above. 3. Cerebral atrophy (ICD10-G31.9). Electronically Signed   By: FMargaretha SheffieldM.D.   On: 08/26/2021 19:11    EKG: My personal interpretation of EKG shows: sinus bradycardia    Assessment/Plan Principal Problem:   Transient alteration of awareness Active Problems:   Dementia without behavioral disturbance (HCC)   Hypertension   Sinus bradycardia    Assessment and Plan: * Transient alteration of awareness Observation telemetry bed. EDP has consulted neurology to see patient. Concern for possible seizure.  MRI brain negative for CVA.  Transient alteration of awareness is a Acute illness/condition that poses a threat to life or bodily function.   Hypertension Uncontrolled in ER. Given her persistent bradycardia, will hold lopressor. Continue diovan 160 mg.  Dementia without behavioral disturbance (HCC) Chronic.  Sinus bradycardia New. HR in the 40-50s. Pt on lopressor at home. Will hold lopressor. May need different HTN meds that don't cause bradycardia. Add prn hydralazine 10 mg q4h prn SBP >170   DVT prophylaxis: SQ Heparin Code Status: Full Code by default Family Communication: attempted to call pt's dtr Olivia Ross. She was driving home and could not speak to this wProbation officer  Disposition Plan: return home  Consults called: EDP has consulted neurology  Admission status: Observation, Telemetry  bed   EKristopher Oppenheim DO [facility] 08/26/2021, 9:06 PM

## 2021-08-26 NOTE — Assessment & Plan Note (Signed)
Uncontrolled in ER. Given her persistent bradycardia, will hold lopressor. Continue diovan 160 mg.

## 2021-08-26 NOTE — Consult Note (Signed)
Discussed briefly with Dr. Dina Rich, MRI brain reviewed.   This is a patient who had a brief syncopal event (unwitnessed onset) lasting approximately 3 minutes but then very quickly returned to baseline without any focal neurological deficits afterwards.  There was some urinary incontinence.  Patient has dementia at baseline as well as hypertension  Dr. Dina Rich questions whether this event may represent seizure.  Her MRI brain does demonstrate chronic right parieto-occipital stroke, which may be a seizure focus.  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 Dina Rich is unsure if this may represent monitor artifact due to PVCs versus being a true finding.  Event is described as not clearly convincing for seizure to me, however given her risk factors of age, prior stroke, and dementia, obtaining a routine EEG is reasonable.  Would not empirically start antiseizure medication based on the story.  Cardiac work-up per ED/primary team  These were brief curbside recommendations, if EEG is abnormal please reach out to neurology for formal consult or if there are other acute neurological questions to address.  Lesleigh Noe MD-PhD [facility] [phone]  Available 7 PM to 7 AM, outside of these hours please call Neurologist on call as listed on Amion.

## 2021-08-26 NOTE — Subjective & Objective (Signed)
CC: altered mental status HPI: 76 year old African-American female history of hypertension, dementia who presents to the ER today with altered consciousness.  Daughter had reported to EDP that she witnessed about a 3-minute.  Patient was nonverbal and not following commands.  Patient's eyes were open and awake.  She then became more aware and was returned back to her baseline.  Patient brought to the ER for evaluation.  MRI of the brain was negative for acute stroke.  EDP discussed the case with neurology.  MRI does show prior strokes with encephalomalacia of the right posterior MCA territory in addition to the left frontal left pons.  Neurology was concern for possible seizure etiology as a cause for altered mental status.  They recommended hospital admission.  Labs: Sodium 135 BUN of 8 creatinine 0.9 White count 10.6 hemoglobin 12.7 platelets of 179 UA negative  EKG which I personally interpreted shows sinus bradycardia with with a PVC.  [facility] hospitalist contacted for admission.

## 2021-08-26 NOTE — Assessment & Plan Note (Signed)
Observation telemetry bed. EDP has consulted neurology to see patient. Concern for possible seizure.  MRI brain negative for CVA.  Transient alteration of awareness is a Acute illness/condition that poses a threat to life or bodily function.

## 2021-08-26 NOTE — ED Notes (Signed)
NS called RN to inform of patient's elevated HR. RN entered room and observed patient HR to be ~120s. RN performed EKG to report to admitting provider.

## 2021-08-26 NOTE — ED Provider Notes (Signed)
[facility] EMERGENCY DEPARTMENT Provider Note   CSN: [id number] Arrival date & time: 08/26/21  1408     History  No chief complaint on file.   Vila Hascall is a 76 y.o. female.  HPI  76 year old female with past medical history of dementia, HTN presents to the emergency department after an episode of change in mental status.  Report from EMS is that the daughter witnessed an approximately 5-minute period at home where the patient was nonverbal and not following commands.  She was reportedly eyes open and awake, staring straight ahead.  The daughter states when she tried to talk to her she would attempt to [facility] but presents.  She reportedly quickly returned back to normal which is conversational, disoriented at times but pleasant.  History of previous urinary tract infection about a month ago.  Patient otherwise denies any acute illness or complaints, no family at bedside.  Daughter is reportedly on her way here.  Home Medications Prior to Admission medications   Medication Sig Start Date End Date Taking? Authorizing Provider  acetaminophen (TYLENOL) 500 MG tablet Take 500 mg by mouth every 6 (six) hours as needed for moderate pain.    [provider]  amLODipine (NORVASC) 5 MG tablet Take 5 mg by mouth daily as needed (if blodd pressure is over 120). 11/02/20   [provider]  carboxymethylcellulose (REFRESH PLUS) 0.5 % SOLN Place 1 drop into both eyes 3 (three) times daily as needed (dry eyes).    [provider]  cephALEXin (KEFLEX) 500 MG capsule Take 1 capsule (500 mg total) by mouth 3 (three) times daily. 10/07/27   Delora Fuel, MD  dicyclomine (BENTYL) 10 MG capsule Take 10 mg by mouth daily as needed for spasms. 07/24/20   [provider]  ferrous sulfate 325 (65 FE) MG tablet Take 1 tablet (325 mg total) by mouth daily. 08/05/19   Delora Fuel, MD  letrozole [facility]) 2.5 MG tablet Take 2.5 mg by mouth daily. 12/12/20   [provider]  magnesium oxide (MAG-OX) 400 (240 Mg) MG tablet Take 0.5 tablets (200 mg total) by mouth 2 (two) times daily. 12/22/20   Regalado, Belkys A, MD  metoprolol tartrate (LOPRESSOR) 25 MG tablet Take 0.5 tablets (12.5 mg total) by mouth 2 (two) times daily. 12/22/20   Regalado, Belkys A, MD  Multiple Vitamin (MULTIVITAMIN WITH MINERALS) TABS tablet Take 1 tablet by mouth daily. Centrum    [provider]  valsartan (DIOVAN) 160 MG tablet Take 160 mg by mouth daily. 12/12/20   [provider]  vancomycin (VANCOCIN) 125 MG capsule Take 1 capsule (125 mg total) by mouth 4 (four) times daily. 04/30/21   Molpus, John, MD  vitamin B-12 100 MCG tablet Take 1 tablet (100 mcg total) by mouth daily. 12/23/20   Regalado, Cassie Freer, MD      Allergies    Diphth-acell pertussis-tetanus, Dtap-hepatitis b recomb-ipv, Norco [hydrocodone-acetaminophen], and Percocet [oxycodone-acetaminophen]    Review of Systems   Review of Systems  Unable to perform ROS: Dementia   Physical Exam Updated Vital Signs BP (!) 167/77   Pulse (!) 36   Temp 97.7 F (36.5 C) (Oral)   Resp 16   SpO2 100%  Physical Exam Vitals and nursing note reviewed.  Constitutional:      General: She is not in acute distress.    Appearance: Normal appearance.  HENT:     Head: Normocephalic.     Mouth/Throat:  Mouth: Mucous membranes are moist.  Eyes:     Pupils: Pupils are equal, round, and reactive to light.  Cardiovascular:     Rate and Rhythm: Normal rate.  Pulmonary:     Effort: Pulmonary effort is normal. No respiratory distress.  Abdominal:     Palpations: Abdomen is soft.     Tenderness: There is no abdominal tenderness.  Skin:    General: Skin is warm.  Neurological:     Mental Status: She is alert. She is disoriented.     Comments: Pleasant, aaox1  Psychiatric:        Mood and Affect: Mood normal.    ED Results / Procedures / Treatments   Labs (all labs ordered are listed, but only  abnormal results are displayed) Labs Reviewed  CBC - Abnormal; Notable for the following components:      Result Value   WBC 10.6 (*)    MCV 100.5 (*)    All other components within normal limits  URINALYSIS, ROUTINE W REFLEX MICROSCOPIC  COMPREHENSIVE METABOLIC PANEL  CBG MONITORING, ED    EKG None  Radiology No results found.  Procedures Procedures    Medications Ordered in ED Medications - No data to display  ED Course/ Medical Decision Making/ A&P                           Medical Decision Making Amount and/or Complexity of Data Reviewed Labs: ordered. Radiology: ordered.  Risk Prescription drug management. Decision regarding hospitalization.   77 year old female presents emergency department after a period of altered mental status.  She was reportedly eating when she began staring straight off in the distance.  The daughter states when she tried to talk it was really just mumbling.  This lasted about 5 minutes and then she returned to baseline.  Does not sound like there was a postictal period.  EMS reported heart rate in the 30s with a stable blood pressure.  On arrival she is triage with a heart rate in the 30s however on physical evaluation and EKG she has sinus bradycardia.  She does have frequent PVCs and periods of sinus arrhythmia/PACs, but I do not see any genuine sinus bradycardia.  Patient has been baseline.  Her work-up is reassuring, urinalysis shows no infection.  MRI of the brain with and without given her breast cancer history shows no metastatic disease.  It does show remote strokes.  Consulted with neurology, Dr. Curly Shores given her symptoms in regards to suspicion for TIA/seizure.  She states that her previous stroke certain specific part of the brain that could increase her risk for epilepsy.  She recommends overnight observation and EEG in the morning and further reevaluation.    I reviewed the patient's telemetry continues to show sinus arrhythmia with  PACs, I do not see any genuine periods of sinus bradycardia.  Patient has had no further episodes here while being monitored.  Plan for medical admission, daughter at bedside agrees.  While the patient was eating she became tachycardic, repeat EKG shows what I believe is sinus tachycardia with arrhythmia and PACs.  I guess it could possibly be short runs of atrial fibrillation but the patient was otherwise asymptomatic throughout this episode, no hypotension.  Patients evaluation and results requires admission for further treatment and care.  Spoke with hospitalist Dr. Bridgett Larsson, reviewed patient's ED course and they accept admission.  Patient agrees with admission plan, offers no new complaints and is stable/unchanged  at time of admit.        Final Clinical Impression(s) / ED Diagnoses Final diagnoses:  None    Rx / DC Orders ED Discharge Orders     None         Lorelle Gibbs, DO 08/26/21 2159

## 2021-08-26 NOTE — Assessment & Plan Note (Signed)
Chronic. 

## 2021-08-26 NOTE — H&P (Signed)
Additional history obtained from dtr Glasgow.  Patient was eating lunch this afternoon in her bedroom.  Daughter had served her lunch.  She came back about 5 or 10 minutes later.  She noticed the patient was staring straight ahead.  She was sort of slumped over to her side.  Eyes were open.  She was drooling.  No tonic-clonic activity was noted.  About 5 minutes after her initial episode while the daughter was on the phone with EMS, patient started to come around.  No urinary incontinence.  By time EMS arrived to the house, patient was back to her neurologic baseline.  Patient does not recall any events after she was served lunch.

## 2021-08-26 NOTE — ED Notes (Signed)
Patient transported to MRI 

## 2021-08-27 ENCOUNTER — Encounter (HOSPITAL_COMMUNITY): Payer: Self-pay | Admitting: Internal Medicine

## 2021-08-27 DIAGNOSIS — R404 Transient alteration of awareness: Secondary | ICD-10-CM | POA: Diagnosis not present

## 2021-08-27 LAB — LIPID PANEL
Cholesterol: 256 mg/dL — ABNORMAL HIGH (ref 0–200)
HDL: 44 mg/dL (ref >40)
LDL Cholesterol: 195 mg/dL — ABNORMAL HIGH (ref 0–99)
Total CHOL/HDL Ratio: 5.8 RATIO
Triglycerides: 84 mg/dL (ref <150)
VLDL: 17 mg/dL (ref 0–40)

## 2021-08-27 LAB — COMPREHENSIVE METABOLIC PANEL
ALT: 15 U/L (ref 0–44)
AST: 21 U/L (ref 15–41)
Albumin: 3.4 g/dL — ABNORMAL LOW (ref 3.5–5.0)
Alkaline Phosphatase: 64 U/L (ref 38–126)
Anion gap: 6 (ref 5–15)
BUN: 8 mg/dL (ref 8–23)
CO2: 26 mmol/L (ref 22–32)
Calcium: 10.3 mg/dL (ref 8.9–10.3)
Chloride: 104 mmol/L (ref 98–111)
Creatinine, Ser: 0.67 mg/dL (ref 0.44–1.00)
GFR, Estimated: >60 mL/min (ref >60)
Glucose, Bld: 118 mg/dL — ABNORMAL HIGH (ref 70–99)
Potassium: 4.1 mmol/L (ref 3.5–5.1)
Sodium: 136 mmol/L (ref 135–145)
Total Bilirubin: 0.7 mg/dL (ref 0.3–1.2)
Total Protein: 6.1 g/dL — ABNORMAL LOW (ref 6.5–8.1)

## 2021-08-27 LAB — CBC WITH DIFFERENTIAL/PLATELET
Abs Immature Granulocytes: 0.06 10*3/uL (ref 0.00–0.07)
Basophils Absolute: 0 10*3/uL (ref 0.0–0.1)
Basophils Relative: 0 %
Eosinophils Absolute: 0.1 10*3/uL (ref 0.0–0.5)
Eosinophils Relative: 1 %
HCT: 39.2 % (ref 36.0–46.0)
Hemoglobin: 12.4 g/dL (ref 12.0–15.0)
Immature Granulocytes: 1 %
Lymphocytes Relative: 27 %
Lymphs Abs: 2.5 10*3/uL (ref 0.7–4.0)
MCH: 30.2 pg (ref 26.0–34.0)
MCHC: 31.6 g/dL (ref 30.0–36.0)
MCV: 95.6 fL (ref 80.0–100.0)
Monocytes Absolute: 0.5 10*3/uL (ref 0.1–1.0)
Monocytes Relative: 5 %
Neutro Abs: 6.1 10*3/uL (ref 1.7–7.7)
Neutrophils Relative %: 66 %
Platelets: 214 10*3/uL (ref 150–400)
RBC: 4.1 MIL/uL (ref 3.87–5.11)
RDW: 14.4 % (ref 11.5–15.5)
WBC: 9.3 10*3/uL (ref 4.0–10.5)
nRBC: 0 % (ref 0.0–0.2)

## 2021-08-27 LAB — TSH: TSH: 3.658 u[IU]/mL (ref 0.350–4.500)

## 2021-08-27 LAB — TROPONIN I (HIGH SENSITIVITY): Troponin I (High Sensitivity): 13 ng/L (ref <18)

## 2021-08-27 LAB — MAGNESIUM: Magnesium: 1.4 mg/dL — ABNORMAL LOW (ref 1.7–2.4)

## 2021-08-27 MED ORDER — ACETAMINOPHEN 325 MG PO TABS: 650.0000 mg | ORAL_TABLET | Freq: Four times a day (QID) | ORAL | Status: DC | PRN

## 2021-08-27 MED ORDER — AMLODIPINE BESYLATE 10 MG PO TABS
10.0000 mg | ORAL_TABLET | Freq: Every day | ORAL | Status: DC | PRN
  Administered 2021-08-28: 10 mg via ORAL
  Filled 2021-08-27: qty 1

## 2021-08-27 MED ORDER — HEPARIN SODIUM (PORCINE) 5000 UNIT/ML IJ SOLN
5000.0000 [IU] | Freq: Three times a day (TID) | INTRAMUSCULAR | Status: DC
  Administered 2021-08-27 – 2021-08-28 (×5): 5000 [IU] via SUBCUTANEOUS
  Filled 2021-08-27 (×5): qty 1

## 2021-08-27 MED ORDER — HYDRALAZINE HCL 10 MG PO TABS
10.0000 mg | ORAL_TABLET | ORAL | Status: DC | PRN
  Administered 2021-08-27 – 2021-08-28 (×2): 10 mg via ORAL
  Filled 2021-08-27 (×2): qty 1

## 2021-08-27 MED ORDER — ONDANSETRON HCL 4 MG PO TABS: 4.0000 mg | ORAL_TABLET | Freq: Four times a day (QID) | ORAL | Status: DC | PRN

## 2021-08-27 MED ORDER — ACETAMINOPHEN 650 MG RE SUPP: 650.0000 mg | Freq: Four times a day (QID) | RECTAL | Status: DC | PRN

## 2021-08-27 MED ORDER — ONDANSETRON HCL 4 MG/2ML IJ SOLN: 4.0000 mg | Freq: Four times a day (QID) | INTRAMUSCULAR | Status: DC | PRN

## 2021-08-27 MED ORDER — IRBESARTAN 150 MG PO TABS
150.0000 mg | ORAL_TABLET | Freq: Every day | ORAL | Status: DC
  Administered 2021-08-27 – 2021-08-28 (×2): 150 mg via ORAL
  Filled 2021-08-27 (×2): qty 1

## 2021-08-27 MED ORDER — MAGNESIUM SULFATE 2 GM/50ML IV SOLN
2.0000 g | Freq: Once | INTRAVENOUS | Status: AC
  Administered 2021-08-27: 2 g via INTRAVENOUS
  Filled 2021-08-27: qty 50

## 2021-08-27 NOTE — Progress Notes (Signed)
PROGRESS NOTE  Olivia Ross  EPP:[id number] DOB: July 14, 1945 DOA: 08/26/2021 PCP: Jolinda Croak, MD   Brief Narrative:  Patient is a 76 year old African-American female with history of hypertension, dementia who presented from home with complaints of altered mental status.  She lives with her daughter.  Daughter found her staring, slumped on her side with eyes open, drooling.  When she was brought to the emergency department.Seizure suspected.  Neurology consulted.  EEG pending.  MRI of the brain was negative for acute findings.  Assessment & Plan:  Principal Problem:   Transient alteration of awareness Active Problems:   Dementia without behavioral disturbance (HCC)   Hypertension   Sinus bradycardia   Altered mental status/transient loss of consciousness: Patient lives with her daughter.  Has history of dementia, prior stroke.  Found to be stating, slumped on her side and unresponsive at home by the daughter.  Admitted for the suspicion of seizure.  Neurology opinion obtained, no recommendation for restarting on AED, pending EEG.  If EEG shows seizure, will consult neurology formally. Monitor mental status. MRI did not show any acute intracranial abnormalities.  Showed evidence of prior strokes with encephalomalacia of the right posterior MCA territory. As per daughter ,this was the second episode.  Hypertension: Hypertensive on presentation.  On avapro.  Continue as needed medications for severe hypertension  Dementia: Chronic.  Continue supportive care, delirium precautions.  During evaluation, she was alert, oriented to place, knew month.  Currently at baseline.  Sinus bradycardia: Heart rate in the range of 40-50.  Patient on Lopressor at home.  Currently on hold.  Hypomagnesemia: Supplemented  Debility/deconditioning: Might need PT/OT consultation          DVT prophylaxis:heparin injection 5,000 Units Start: 08/27/21 0715 SCDs Start: 08/27/21 6063     Code  Status: Full Code  Family Communication: Called and discussed with daughter on phone on 5/29  Patient status:Inpatient  Patient is from :Home  Anticipated discharge KZ:[facility] vs [facility]  Estimated DC date:Pending seizure work up   Consultants: None  Procedures: None  Antimicrobials:  Anti-infectives (From admission, onward)    None       Subjective: Patient seen and examined at the bedside this morning.  Hemodynamically stable.  She was alert, awake, sitting in the bed.  Communicates well.  She is aware where she is, knows current month.  Not in any kind of distress.  Objective: Vitals:   08/27/21 0430 08/27/21 0730 08/27/21 0800 08/27/21 0815  BP: (!) 150/67 (!) 149/63 (!) 161/64   Pulse: 63  (!) 59 60  Resp: 15 20 (!) 22 12  Temp:      TempSrc:      SpO2: 99%  100% 100%  Weight:      Height:       No intake or output data in the 24 hours ending 08/27/21 0926 Filed Weights   08/26/21 1636  Weight: 70.3 kg    Examination:  General exam: Overall comfortable, not in distress HEENT: PERRL Respiratory system:  no wheezes or crackles  Cardiovascular system: S1 & S2 heard, RRR.  Gastrointestinal system: Abdomen is nondistended, soft and nontender. Central nervous system: Alert and awake, oriented to place, knows month Extremities: No edema, no clubbing ,no cyanosis Skin: No rashes, no ulcers,no icterus     Data Reviewed: I have personally reviewed following labs and imaging studies  CBC: Recent Labs  Lab 08/26/21 1455  WBC 10.6*  HGB 12.7  HCT 40.8  MCV 100.5*  PLT  833   Basic Metabolic Panel: Recent Labs  Lab 08/26/21 1455 08/26/21 2242  NA 135  --   K 4.1  --   CL 107  --   CO2 19*  --   GLUCOSE 109*  --   BUN 8  --   CREATININE 0.91  --   CALCIUM 10.1  --   MG  --  1.4*     No results found for this or any previous visit (from the past 240 hour(s)).   Radiology Studies: MR Brain W and Wo Contrast  Result Date: 08/26/2021 CLINICAL  DATA:  Neuro deficit, acute, stroke suspected EXAM: MRI HEAD WITHOUT AND WITH CONTRAST TECHNIQUE: Multiplanar, multiecho pulse sequences of the brain and surrounding structures were obtained without and with intravenous contrast. CONTRAST:  36m GADAVIST GADOBUTROL 1 MMOL/ML IV SOLN COMPARISON:  CT head April 8, 23. FINDINGS: Brain: No acute infarction, acute hemorrhage, hydrocephalus, extra-axial collection or mass lesion. 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 Additional remote left frontal, left pons, and bilateral cerebellar infarcts. Additional patchy T2/FLAIR hyperintensities in the white matter, nonspecific but compatible with chronic microvascular disease. No pathologic enhancement. Cerebral atrophy. Vascular: Major arterial flow voids are maintained at the skull base. Poor visualization of the right MCA flow void, probably chronic given above findings. 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 Skull and upper cervical spine: Normal marrow signal. Sinuses/Orbits: Clear sinuses.  No acute orbital findings. Other: No mastoid effusions. IMPRESSION: 1. No evidence of acute intracranial abnormality. 2. Multiple remote infarcts, described above. 3. Cerebral atrophy (ICD10-G31.9). Electronically Signed   By: FMargaretha SheffieldM.D.   On: 08/26/2021 19:11    Scheduled Meds:  heparin  5,000 Units Subcutaneous Q8H   irbesartan  150 mg Oral Daily   LORazepam  0.5 mg Intravenous Once   Continuous Infusions:   LOS: 0 days   AShelly Coss MD [facility] P5/29/2023, 9:26 AM

## 2021-08-27 NOTE — ED Notes (Signed)
Crissa Sowder (daughter) called asking to speak to patient to get an update on her. Phone is [phone].

## 2021-08-27 NOTE — Progress Notes (Signed)
EEG complete - results pending 

## 2021-08-27 NOTE — ED Notes (Signed)
ED TO INPATIENT HANDOFF REPORT  ED Nurse Name and Phone #: [phone]  S Name/Age/Gender Olivia Ross 76 y.o. female Room/Bed: 039C/039C  Code Status   Code Status: Full Code  Home/SNF/Other Home Patient oriented to: self and place Is this baseline?   Triage Complete: Triage complete  Chief Complaint Transient alteration of awareness [R40.4]  Triage Note Patient arrived by [facility] following a 3 minute period at home where she was non-verbal and not following commands. Patient quickly returned to normal baseline and on arrival alert and oriented to baseline. Patient alert to person and place. Strong smell of urine. Confused to time . When patient placed on cardiac monitor noted to be in a bradycardiac rhythm/ patient with HR 30s, denies CP. EMS did not report any brady rhythms during transport   Allergies Allergies  Allergen Reactions   Diphth-Acell Pertussis-Tetanus     Other reaction(s): Other (See Comments)   Dtap-Hepatitis B Recomb-Ipv Other (See Comments)    unknown   Norco [Hydrocodone-Acetaminophen] Nausea And Vomiting   Percocet [Oxycodone-Acetaminophen] Nausea And Vomiting    Level of Care/Admitting Diagnosis ED Disposition     ED Disposition  Admit   Condition  --   [place]: [place] [[id number]]  Level of Care: Telemetry Medical [104]  May place patient in observation at [facility] or [facility] if equivalent level of care is available:: No  Covid Evaluation: Asymptomatic - no recent exposure (last 10 days) testing not required  Diagnosis: Transient alteration of awareness [780.02.ICD-9-CM]  Admitting Physician: Bridgett Larsson, [place]  Attending Physician: Bridgett Larsson, ERIC [[id number]]          B Medical/Surgery History Past Medical History:  Diagnosis Date   Breast cancer ([place])    Dementia ([place])    Hypertension    History reviewed. No pertinent surgical history.   A IV Location/Drains/Wounds Patient Lines/Drains/Airways  Status     Active Line/Drains/Airways     Name Placement date Placement time Site Days   Peripheral IV 08/26/21 22 G Anterior;Distal;Left Forearm 08/26/21  1452  Forearm  1   Peripheral IV 08/27/21 22 G Right Forearm 08/27/21  0101  Forearm  less than 1   External Urinary Catheter 08/26/21  2318  --  1            Intake/Output Last 24 hours No intake or output data in the 24 hours ending 08/27/21 1217  Labs/Imaging Results for orders placed or performed during the hospital encounter of 08/26/21 (from the past 48 hour(s))  CBC     Status: Abnormal   Collection Time: 08/26/21  2:55 PM  Result Value Ref Range   WBC 10.6 (H) 4.0 - 10.5 K/uL   RBC 4.06 3.87 - 5.11 MIL/uL   Hemoglobin 12.7 12.0 - 15.0 g/dL   HCT 40.8 36.0 - 46.0 %   MCV 100.5 (H) 80.0 - 100.0 fL   MCH 31.3 26.0 - 34.0 pg   MCHC 31.1 30.0 - 36.0 g/dL   RDW 14.5 11.5 - 15.5 %   Platelets 179 150 - 400 K/uL   nRBC 0.0 0.0 - 0.2 %    Comment: Performed at [facility], [place] [address], [place], [place] [address]  Comprehensive metabolic panel     Status: Abnormal   Collection Time: 08/26/21  2:55 PM  Result Value Ref Range   Sodium 135 135 - 145 mmol/L   Potassium 4.1 3.5 - 5.1 mmol/L   Chloride 107 98 -  111 mmol/L   CO2 19 (L) 22 - 32 mmol/L   Glucose, Bld 109 (H) 70 - 99 mg/dL    Comment: Glucose reference range applies only to samples taken after fasting for at least 8 hours.   BUN 8 8 - 23 mg/dL   Creatinine, Ser 0.91 0.44 - 1.00 mg/dL   Calcium 10.1 8.9 - 10.3 mg/dL   Total Protein 5.9 (L) 6.5 - 8.1 g/dL   Albumin 3.3 (L) 3.5 - 5.0 g/dL   AST 23 15 - 41 U/L   ALT 13 0 - 44 U/L   Alkaline Phosphatase 61 38 - 126 U/L   Total Bilirubin 0.7 0.3 - 1.2 mg/dL   GFR, Estimated >60 >60 mL/min    Comment: (NOTE) Calculated using the CKD-EPI Creatinine Equation (2021)    Anion gap 9 5 - 15    Comment: Performed at [facility], [place] [address] [place], [place] [address]  CBG monitoring, ED      Status: None   Collection Time: 08/26/21  3:25 PM  Result Value Ref Range   Glucose-Capillary 86 70 - 99 mg/dL    Comment: Glucose reference range applies only to samples taken after fasting for at least 8 hours.  Urinalysis, Routine w reflex microscopic Urine, In & Out Cath     Status: Abnormal   Collection Time: 08/26/21  5:36 PM  Result Value Ref Range   Color, Urine YELLOW YELLOW   APPearance CLEAR CLEAR   Specific Gravity, Urine 1.014 1.005 - 1.030   pH 6.0 5.0 - 8.0   Glucose, UA NEGATIVE NEGATIVE mg/dL   Hgb urine dipstick NEGATIVE NEGATIVE   Bilirubin Urine NEGATIVE NEGATIVE   Ketones, ur 5 (A) NEGATIVE mg/dL   Protein, ur NEGATIVE NEGATIVE mg/dL   Nitrite NEGATIVE NEGATIVE   Leukocytes,Ua NEGATIVE NEGATIVE    Comment: Performed at [facility] [address], [place], [place] [address]  Magnesium     Status: Abnormal   Collection Time: 08/26/21 10:42 PM  Result Value Ref Range   Magnesium 1.4 (L) 1.7 - 2.4 mg/dL    Comment: Performed at [facility] [address] [place], [place] [address]  TSH     Status: None   Collection Time: 08/26/21 10:42 PM  Result Value Ref Range   TSH 3.664 0.350 - 4.500 uIU/mL    Comment: Performed by a 3rd Generation assay with a functional sensitivity of <=0.01 uIU/mL. Performed at [facility], [place] [address] [place], [place] [address]   Troponin I (High Sensitivity)     Status: None   Collection Time: 08/26/21 10:42 PM  Result Value Ref Range   Troponin I (High Sensitivity) 12 <18 ng/L    Comment: (NOTE) Elevated high sensitivity troponin I (hsTnI) values and significant  changes across serial measurements may suggest ACS but many other  chronic and acute conditions are known to elevate hsTnI results.  Refer to the "Links" section for chest pain algorithms and additional  guidance. Performed at [facility], [place] [address] [place], [place] [address]   Troponin I (High Sensitivity)     Status: None    Collection Time: 08/27/21  1:00 AM  Result Value Ref Range   Troponin I (High Sensitivity) 13 <18 ng/L    Comment: (NOTE) Elevated high sensitivity troponin I (hsTnI) values and significant  changes across serial measurements may suggest ACS but many other  chronic and acute conditions are known  to elevate hsTnI results.  Refer to the "Links" section for chest pain algorithms and additional  guidance. Performed at [facility], [place] [address], [place], [place] [address]   Magnesium     Status: Abnormal   Collection Time: 08/27/21  9:07 AM  Result Value Ref Range   Magnesium 1.4 (L) 1.7 - 2.4 mg/dL    Comment: Performed at [facility] [address] [place], [place] [address]  CBC with Differential/Platelet     Status: None   Collection Time: 08/27/21  9:07 AM  Result Value Ref Range   WBC 9.3 4.0 - 10.5 K/uL   RBC 4.10 3.87 - 5.11 MIL/uL   Hemoglobin 12.4 12.0 - 15.0 g/dL   HCT 39.2 36.0 - 46.0 %   MCV 95.6 80.0 - 100.0 fL   MCH 30.2 26.0 - 34.0 pg   MCHC 31.6 30.0 - 36.0 g/dL   RDW 14.4 11.5 - 15.5 %   Platelets 214 150 - 400 K/uL   nRBC 0.0 0.0 - 0.2 %   Neutrophils Relative % 66 %   Neutro Abs 6.1 1.7 - 7.7 K/uL   Lymphocytes Relative 27 %   Lymphs Abs 2.5 0.7 - 4.0 K/uL   Monocytes Relative 5 %   Monocytes Absolute 0.5 0.1 - 1.0 K/uL   Eosinophils Relative 1 %   Eosinophils Absolute 0.1 0.0 - 0.5 K/uL   Basophils Relative 0 %   Basophils Absolute 0.0 0.0 - 0.1 K/uL   Immature Granulocytes 1 %   Abs Immature Granulocytes 0.06 0.00 - 0.07 K/uL    Comment: Performed at [facility], [address] [place], [place] [address]  Comprehensive metabolic panel     Status: Abnormal   Collection Time: 08/27/21  9:07 AM  Result Value Ref Range   Sodium 136 135 - 145 mmol/L   Potassium 4.1 3.5 - 5.1 mmol/L   Chloride 104 98 - 111 mmol/L   CO2 26 22 - 32 mmol/L   Glucose, Bld 118 (H) 70 - 99 mg/dL    Comment: Glucose reference range applies only to  samples taken after fasting for at least 8 hours.   BUN 8 8 - 23 mg/dL   Creatinine, Ser 0.67 0.44 - 1.00 mg/dL   Calcium 10.3 8.9 - 10.3 mg/dL   Total Protein 6.1 (L) 6.5 - 8.1 g/dL   Albumin 3.4 (L) 3.5 - 5.0 g/dL   AST 21 15 - 41 U/L   ALT 15 0 - 44 U/L   Alkaline Phosphatase 64 38 - 126 U/L   Total Bilirubin 0.7 0.3 - 1.2 mg/dL   GFR, Estimated >60 >60 mL/min    Comment: (NOTE) Calculated using the CKD-EPI Creatinine Equation (2021)    Anion gap 6 5 - 15    Comment: Performed at [facility], [place] [address], [place], [place] [address]  TSH     Status: None   Collection Time: 08/27/21  9:07 AM  Result Value Ref Range   TSH 3.658 0.350 - 4.500 uIU/mL    Comment: Performed by a 3rd Generation assay with a functional sensitivity of <=0.01 uIU/mL. Performed at [facility], [place] [address], [place], [place] [address]   Lipid panel     Status: Abnormal   Collection Time: 08/27/21  9:07 AM  Result Value Ref Range   Cholesterol 256 (H) 0 - 200 mg/dL   Triglycerides 84 <150 mg/dL   HDL 44 >  40 mg/dL   Total CHOL/HDL Ratio 5.8 RATIO   VLDL 17 0 - 40 mg/dL   LDL Cholesterol 195 (H) 0 - 99 mg/dL    Comment:        Total Cholesterol/HDL:CHD Risk Coronary Heart Disease Risk Table                     Men   Women  1/2 Average Risk   3.4   3.3  Average Risk       5.0   4.4  2 X Average Risk   9.6   7.1  3 X Average Risk  23.4   11.0        Use the calculated Patient Ratio above and the CHD Risk Table to determine the patient's CHD Risk.        ATP III CLASSIFICATION (LDL):  <100     mg/dL   Optimal  100-129  mg/dL   Near or Above                    Optimal  130-159  mg/dL   Borderline  160-189  mg/dL   High  >190     mg/dL   Very High Performed at [facility] [address] [place], [place] [address]    MR Brain W and Wo Contrast  Result Date: 08/26/2021 CLINICAL DATA:  Neuro deficit, acute, stroke suspected EXAM: MRI HEAD WITHOUT AND WITH CONTRAST  TECHNIQUE: Multiplanar, multiecho pulse sequences of the brain and surrounding structures were obtained without and with intravenous contrast. CONTRAST:  33m GADAVIST GADOBUTROL 1 MMOL/ML IV SOLN COMPARISON:  CT head April 8, 23. FINDINGS: Brain: No acute infarction, acute hemorrhage, hydrocephalus, extra-axial collection or mass lesion. 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 Additional remote left frontal, left pons, and bilateral cerebellar infarcts. Additional patchy T2/FLAIR hyperintensities in the white matter, nonspecific but compatible with chronic microvascular disease. No pathologic enhancement. Cerebral atrophy. Vascular: Major arterial flow voids are maintained at the skull base. Poor visualization of the right MCA flow void, probably chronic given above findings. 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 Skull and upper cervical spine: Normal marrow signal. Sinuses/Orbits: Clear sinuses.  No acute orbital findings. Other: No mastoid effusions. IMPRESSION: 1. No evidence of acute intracranial abnormality. 2. Multiple remote infarcts, described above. 3. Cerebral atrophy (ICD10-G31.9). Electronically Signed   By: FMargaretha SheffieldM.D.   On: 08/26/2021 19:11    Pending Labs Unresulted Labs (From admission, onward)     Start     Ordered   08/28/21 0500  CBC  Tomorrow morning,   R        08/27/21 0932   08/28/21 06659 Basic metabolic panel  Tomorrow morning,   R        08/27/21 0932   08/28/21 0500  Magnesium  Tomorrow morning,   R        08/27/21 0932            Vitals/Pain Today's Vitals   08/27/21 1105 08/27/21 1110 08/27/21 1112 08/27/21 1200  BP: (!) 165/70   (!) 168/60  Pulse:    63  Resp: '15 17  19  '$ Temp:      TempSrc:      SpO2:   98% 97%  Weight:      Height:      PainSc:        Isolation Precautions  No active isolations  Medications Medications  amLODipine (NORVASC) tablet  10 mg (has no administration in time range)  irbesartan (AVAPRO) tablet 150 mg (150 mg Oral Given 08/27/21 1111)  heparin injection 5,000 Units (5,000 Units Subcutaneous Given 08/27/21 0731)  acetaminophen (TYLENOL) tablet 650 mg (has no administration in time range)    Or  acetaminophen (TYLENOL) suppository 650 mg (has no administration in time range)  ondansetron (ZOFRAN) tablet 4 mg (has no administration in time range)    Or  ondansetron (ZOFRAN) injection 4 mg (has no administration in time range)  hydrALAZINE (APRESOLINE) tablet 10 mg (has no administration in time range)  hydrALAZINE (APRESOLINE) injection 10 mg (10 mg Intravenous Given 08/26/21 2032)  gadobutrol (GADAVIST) 1 MMOL/ML injection 7 mL (7 mLs Intravenous Contrast Given 08/26/21 1834)  amLODipine (NORVASC) tablet 5 mg (5 mg Oral Given 08/26/21 2050)  magnesium sulfate IVPB 2 g 50 mL (2 g Intravenous New Bag/Given 08/27/21 1103)    Mobility  Low fall risk   Focused Assessments    R Recommendations: See Admitting Provider Note  Report given to:   Additional Notes:

## 2021-08-27 NOTE — ED Notes (Signed)
Patient provided with apple sauce and sprite

## 2021-08-27 NOTE — ED Notes (Signed)
Breakfast order placed ?

## 2021-08-27 NOTE — Plan of Care (Signed)

## 2021-08-27 NOTE — ED Notes (Signed)
Breakfast tray at bedside 

## 2021-08-27 NOTE — ED Notes (Signed)
Lunch tray at bedside. ?

## 2021-08-27 NOTE — Procedures (Signed)
Patient Name: Olivia Ross  MRN: [id number]  Epilepsy Attending: Lora Havens  Referring Physician/Provider: Shelly Coss, MD Date: 08/27/2021 Duration: 21.15 mins  Patient history: 76yo F with transient alteration of awareness. EEG to evaluate for seizure.  Level of alertness: Awake  AEDs during EEG study: None  Technical aspects: This EEG study was done with scalp electrodes positioned according to the 10-20 International system of electrode placement. Electrical activity was acquired at a sampling rate of '500Hz'$  and reviewed with a high frequency filter of '70Hz'$  and a low frequency filter of '1Hz'$ . EEG data were recorded continuously and digitally stored.   Description: The posterior dominant rhythm consists of 8-9 Hz activity of moderate voltage (25-35 uV) seen predominantly in posterior head regions, symmetric and reactive to eye opening and eye closing. EEG showed intermittent left temporal 2-'3hz'$  delta slowing. There is also intermittent 3-'5hz'$  theta-delta slowing in right posterior quadrant.   Hyperventilation and photic stimulation were not performed.     ABNORMALITY - Intermittent slow, left temporal region - Intermittent slow, right posterior quadrant  IMPRESSION: This study is suggestive of cortical dysfunction arising from left temporal and right posterior quadrant region likely secondary to underlying chronic strokes. No seizures or epileptiform discharges were seen throughout the recording.  Chevella Pearce Barbra Sarks

## 2021-08-28 DIAGNOSIS — R404 Transient alteration of awareness: Secondary | ICD-10-CM | POA: Diagnosis not present

## 2021-08-28 LAB — BASIC METABOLIC PANEL
Anion gap: 8 (ref 5–15)
BUN: 9 mg/dL (ref 8–23)
CO2: 22 mmol/L (ref 22–32)
Calcium: 9.5 mg/dL (ref 8.9–10.3)
Chloride: 100 mmol/L (ref 98–111)
Creatinine, Ser: 0.5 mg/dL (ref 0.44–1.00)
GFR, Estimated: >60 mL/min (ref >60)
Glucose, Bld: 78 mg/dL (ref 70–99)
Potassium: 3.8 mmol/L (ref 3.5–5.1)
Sodium: 130 mmol/L — ABNORMAL LOW (ref 135–145)

## 2021-08-28 LAB — CBC
HCT: 36.3 % (ref 36.0–46.0)
Hemoglobin: 12 g/dL (ref 12.0–15.0)
MCH: 30.8 pg (ref 26.0–34.0)
MCHC: 33.1 g/dL (ref 30.0–36.0)
MCV: 93.1 fL (ref 80.0–100.0)
Platelets: 179 10*3/uL (ref 150–400)
RBC: 3.9 MIL/uL (ref 3.87–5.11)
RDW: 14.3 % (ref 11.5–15.5)
WBC: 9.2 10*3/uL (ref 4.0–10.5)
nRBC: 0 % (ref 0.0–0.2)

## 2021-08-28 LAB — MAGNESIUM: Magnesium: 1.7 mg/dL (ref 1.7–2.4)

## 2021-08-28 MED ORDER — ATORVASTATIN CALCIUM 80 MG PO TABS: 80.0000 mg | ORAL_TABLET | Freq: Every day | ORAL | 1 refills | Status: AC

## 2021-08-28 MED ORDER — ATORVASTATIN CALCIUM 80 MG PO TABS
80.0000 mg | ORAL_TABLET | Freq: Every day | ORAL | Status: DC
  Administered 2021-08-28: 80 mg via ORAL
  Filled 2021-08-28: qty 1

## 2021-08-28 MED ORDER — AMLODIPINE BESYLATE 10 MG PO TABS
10.0000 mg | ORAL_TABLET | Freq: Every day | ORAL | Status: DC
  Administered 2021-08-28: 10 mg via ORAL
  Filled 2021-08-28: qty 1

## 2021-08-28 MED ORDER — METOPROLOL TARTRATE 12.5 MG HALF TABLET
12.5000 mg | ORAL_TABLET | Freq: Two times a day (BID) | ORAL | Status: DC
  Administered 2021-08-28: 12.5 mg via ORAL
  Filled 2021-08-28: qty 1

## 2021-08-28 MED ORDER — AMLODIPINE BESYLATE 5 MG PO TABS
5.0000 mg | ORAL_TABLET | Freq: Every day | ORAL | 1 refills | Status: AC
Start: 2021-08-28 — End: ?

## 2021-08-28 NOTE — Plan of Care (Addendum)
Pt discharged home to daughter via transport. Previous RN Pinali completed AVS and packet for transport. Attempted to call daughter to notify of transport leaving no answer. Msg left to call RN back.

## 2021-08-28 NOTE — Discharge Summary (Signed)
Physician Discharge Summary  Olivia Ross FWY:[id number] DOB: June 12, 1945 DOA: 08/26/2021  PCP: Jolinda Croak, MD  Admit date: 08/26/2021 Discharge date: 08/28/2021  Admitted From: Home Disposition:  Home  Discharge Condition:Stable CODE STATUS:FULL Diet recommendation: Heart Healthy   Brief/Interim Summary:  Patient is a 76 year old African-American female with history of hypertension, dementia who presented from home with complaints of altered mental status.  She lives with her daughter.  Daughter found her staring, slumped on her side with eyes open, drooling.  When she was brought to the emergency department.Seizure suspected.  MRI of the brain was negative for acute findings.  EEG did not show any seizure activity or epileptiform discharges but showed cortical dysfunction arising from left temporal/right posterior quadrant likely secondary to underlying chronic strokes.  Currently patient's mental status is at baseline and hemodynamically stable.  She is medically stable for discharge to home today.  She can follow-up with neurology as an outpatient in 4 weeks.  Following problems were addressed during her hospitalization:  Altered mental status/transient loss of consciousness: Patient lives with her daughter.  Has history of dementia, prior stroke.  Found to be stating, slumped on her side and unresponsive at home by the daughter.  Admitted for the suspicion of seizure.  Neurology opinion obtained, no recommendation for restarting on AED. EEG did not show any seizure activity or epileptiform discharges but showed cortical dysfunction arising from left temporal/right posterior quadrant likely secondary to underlying chronic strokes.  Currently patient's mental status is at baseline.MRI did not show any acute intracranial abnormalities.  Showed evidence of prior strokes with encephalomalacia of the right posterior MCA territory. As per daughter ,this was the second episode.  We could not  find any etiology for the event.  Patient remains comfortable.  We recommend follow-up with neurology as an outpatient in 4 weeks.   Hypertension: Hypertensive on presentation.  Metoprolol discontinued.  Continue amlodipine 5 mg daily and Avapro  Sinus bradycardia: Heart rate noticed to be in the range of 40-50 on presentation.  Metoprolol discontinued  Dementia:  Continue supportive care.  She is oriented to place, knew month.  Currently at baseline.  Hyperlipidemia: LDL in the range of 190s.  Started on Lipitor 80 mg daily.  Check LFT and lipid panel in 4 to 6 weeks   Hypomagnesemia: Supplemented   Debility/deconditioning: PT recommended home health on discharge  Discharge Diagnoses:  Principal Problem:   Transient alteration of awareness Active Problems:   Dementia without behavioral disturbance (HCC)   Hypertension   Sinus bradycardia    Discharge Instructions  Discharge Instructions     Ambulatory referral to Neurology   Complete by: As directed    An appointment is requested in approximately: 4 weeks   Diet - low sodium heart healthy   Complete by: As directed    Discharge instructions   Complete by: As directed    1)Please take prescribed medication as instructed.Monitor your blood pressure at home. 2)Follow up with your PCP in a week 3)Do a lipid panel and liver function test in 4 to 6 weeks 4)Follow up with neurology in 4 weeks.  Name and number the provider group has been attached   Increase activity slowly   Complete by: As directed       Allergies as of 08/28/2021       Reactions   Diphth-acell Pertussis-tetanus    Other reaction(s): Other (See Comments)   Dtap-hepatitis B Recomb-ipv Other (See Comments)   unknown   Norco [hydrocodone-acetaminophen]  Nausea And Vomiting   Percocet [oxycodone-acetaminophen] Nausea And Vomiting        Medication List     STOP taking these medications    metoprolol tartrate 25 MG tablet Commonly known as:  LOPRESSOR       TAKE these medications    acetaminophen 500 MG tablet Commonly known as: TYLENOL Take 500 mg by mouth every 6 (six) hours as needed for moderate pain.   amLODipine 5 MG tablet Commonly known as: NORVASC Take 1 tablet (5 mg total) by mouth daily. What changed:  when to take this reasons to take this   atorvastatin 80 MG tablet Commonly known as: LIPITOR Take 1 tablet (80 mg total) by mouth daily. Start taking on: Aug 29, 2021   carboxymethylcellulose 0.5 % Soln Commonly known as: REFRESH PLUS Place 1 drop into both eyes 3 (three) times daily as needed (dry eyes).   cyanocobalamin 100 MCG tablet Take 1 tablet (100 mcg total) by mouth daily.   dicyclomine 10 MG capsule Commonly known as: BENTYL Take 10 mg by mouth daily as needed for spasms.   ferrous sulfate 325 (65 FE) MG tablet Take 1 tablet (325 mg total) by mouth daily.   letrozole 2.5 MG tablet Commonly known as: FEMARA Take 2.5 mg by mouth daily.   loperamide 2 MG tablet Commonly known as: IMODIUM A-D Take 2 mg by mouth 4 (four) times daily as needed for diarrhea or loose stools.   magnesium oxide 400 (240 Mg) MG tablet Commonly known as: MAG-OX Take 0.5 tablets (200 mg total) by mouth 2 (two) times daily.   multivitamin with minerals Tabs tablet Take 1 tablet by mouth daily. Centrum   valsartan 160 MG tablet Commonly known as: DIOVAN Take 160 mg by mouth daily.        Follow-up Information     Jolinda Croak, MD. Schedule an appointment as soon as possible for a visit in 1 week(s).   Specialty: Family Medicine Contact information: [place] [address] [facility]. Schedule an appointment as soon as possible for a visit in 4 week(s).   Specialty: Neurology Contact information: [place] [phone]               Allergies  Allergen  Reactions   Diphth-Acell Pertussis-Tetanus     Other reaction(s): Other (See Comments)   Dtap-Hepatitis B Recomb-Ipv Other (See Comments)    unknown   Norco [Hydrocodone-Acetaminophen] Nausea And Vomiting   Percocet [Oxycodone-Acetaminophen] Nausea And Vomiting    Consultations: None   Procedures/Studies: MR Brain W and Wo Contrast  Result Date: 08/26/2021 CLINICAL DATA:  Neuro deficit, acute, stroke suspected EXAM: MRI HEAD WITHOUT AND WITH CONTRAST TECHNIQUE: Multiplanar, multiecho pulse sequences of the brain and surrounding structures were obtained without and with intravenous contrast. CONTRAST:  24m GADAVIST GADOBUTROL 1 MMOL/ML IV SOLN COMPARISON:  CT head April 8, 23. FINDINGS: Brain: No acute infarction, acute hemorrhage, hydrocephalus, extra-axial collection or mass lesion. 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 Additional remote left frontal, left pons, and bilateral cerebellar infarcts. Additional patchy T2/FLAIR hyperintensities in the white matter, nonspecific but compatible with chronic microvascular disease. No pathologic enhancement. Cerebral atrophy. Vascular: Major arterial flow voids are maintained at the skull base. Poor visualization of the right MCA flow void, probably chronic given above findings. 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 Skull and upper cervical spine: Normal marrow signal. Sinuses/Orbits: Clear sinuses.  No acute orbital findings. Other: No mastoid effusions. IMPRESSION: 1. No evidence of acute intracranial abnormality. 2. Multiple remote infarcts, described above. 3. Cerebral atrophy (ICD10-G31.9). Electronically Signed   By: Margaretha Sheffield M.D.   On: 08/26/2021 19:11   DG Chest Port 1 View  Result Date: 08/06/2021 CLINICAL DATA:  Altered mental status. EXAM: PORTABLE CHEST 1 VIEW COMPARISON:  02/03/2021 FINDINGS: The heart is normal in size. Stable mediastinal  contours with aortic atherosclerosis. The lungs are clear. Pulmonary vasculature is normal. No consolidation, pleural effusion, or pneumothorax. No acute osseous abnormalities are seen. Right axillary and bilateral chest wall surgical clips. IMPRESSION: No acute chest finding. Electronically Signed   By: Keith Rake M.D.   On: 08/06/2021 23:37   EEG adult  Result Date: 08/27/2021 Lora Havens, MD     08/27/2021  5:46 PM Patient Name: Olivia Ross MRN: [id number] Epilepsy Attending: Lora Havens Referring Physician/Provider: Shelly Coss, MD Date: 08/27/2021 Duration: 21.15 mins Patient history: 76yo F with transient alteration of awareness. EEG to evaluate for seizure. Level of alertness: Awake AEDs during EEG study: None Technical aspects: This EEG study was done with scalp electrodes positioned according to the 10-20 International system of electrode placement. Electrical activity was acquired at a sampling rate of '500Hz'$  and reviewed with a high frequency filter of '70Hz'$  and a low frequency filter of '1Hz'$ . EEG data were recorded continuously and digitally stored. Description: The posterior dominant rhythm consists of 8-9 Hz activity of moderate voltage (25-35 uV) seen predominantly in posterior head regions, symmetric and reactive to eye opening and eye closing. EEG showed intermittent left temporal 2-'3hz'$  delta slowing. There is also intermittent 3-'5hz'$  theta-delta slowing in right posterior quadrant.   Hyperventilation and photic stimulation were not performed.   ABNORMALITY - Intermittent slow, left temporal region - Intermittent slow, right posterior quadrant IMPRESSION: This study is suggestive of cortical dysfunction arising from left temporal and right posterior quadrant region likely secondary to underlying chronic strokes. No seizures or epileptiform discharges were seen throughout the recording. Priyanka Barbra Sarks      Subjective: Patient seen and examined at the bedside this morning.   Hemodynamically stable.  Comfortable, mental status at baseline.  Medically stable for discharge.  Tried to call daughter several times on phone, connection unsuccessful  Discharge Exam: Vitals:   08/28/21 0513 08/28/21 0850  BP: (!) 165/78 (!) 112/50  Pulse: 75 72  Resp:  17  Temp:  (!) 97.5 F (36.4 C)  SpO2:  95%   Vitals:   08/28/21 0321 08/28/21 0339 08/28/21 0513 08/28/21 0850  BP: (!) 193/67 (!) 192/67 (!) 165/78 (!) 112/50  Pulse: 61 (!) 58 75 72  Resp: 18   17  Temp: 97.8 F (36.6 C)   (!) 97.5 F (36.4 C)  TempSrc:    Oral  SpO2: 100%   95%  Weight:      Height:        General: Pt is alert, awake, not in acute distress Cardiovascular: RRR, S1/S2 +, no rubs, no gallops Respiratory: CTA bilaterally, no wheezing, no rhonchi Abdominal: Soft, NT, ND, bowel sounds + Extremities: no edema, no cyanosis    The results of significant diagnostics from this hospitalization (including imaging, microbiology, ancillary and laboratory) are listed below for reference.     Microbiology: No results found for this or any previous visit (from the past  240 hour(s)).   Labs: BNP (last 3 results) Recent Labs    01/23/21 2306  BNP 10.6   Basic Metabolic Panel: Recent Labs  Lab 08/26/21 1455 08/26/21 2242 08/27/21 0907 08/28/21 0223  NA 135  --  136 130*  K 4.1  --  4.1 3.8  CL 107  --  104 100  CO2 19*  --  26 22  GLUCOSE 109*  --  118* 78  BUN 8  --  8 9  CREATININE 0.91  --  0.67 0.50  CALCIUM 10.1  --  10.3 9.5  MG  --  1.4* 1.4* 1.7   Liver Function Tests: Recent Labs  Lab 08/26/21 1455 08/27/21 0907  AST 23 21  ALT 13 15  ALKPHOS 61 64  BILITOT 0.7 0.7  PROT 5.9* 6.1*  ALBUMIN 3.3* 3.4*   No results for input(s): LIPASE, AMYLASE in the last 168 hours. No results for input(s): AMMONIA in the last 168 hours. CBC: Recent Labs  Lab 08/26/21 1455 08/27/21 0907 08/28/21 0223  WBC 10.6* 9.3 9.2  NEUTROABS  --  6.1  --   HGB 12.7 12.4 12.0  HCT  40.8 39.2 36.3  MCV 100.5* 95.6 93.1  PLT 179 214 179   Cardiac Enzymes: No results for input(s): CKTOTAL, CKMB, CKMBINDEX, TROPONINI in the last 168 hours. BNP: Invalid input(s): POCBNP CBG: Recent Labs  Lab 08/26/21 1525  GLUCAP 86   D-Dimer No results for input(s): DDIMER in the last 72 hours. Hgb A1c No results for input(s): HGBA1C in the last 72 hours. Lipid Profile Recent Labs    08/27/21 0907  CHOL 256*  HDL 44  LDLCALC 195*  TRIG 84  CHOLHDL 5.8   Thyroid function studies Recent Labs    08/27/21 0907  TSH 3.658   Anemia work up No results for input(s): VITAMINB12, FOLATE, FERRITIN, TIBC, IRON, RETICCTPCT in the last 72 hours. Urinalysis    Component Value Date/Time   COLORURINE YELLOW 08/26/2021 1736   APPEARANCEUR CLEAR 08/26/2021 1736   LABSPEC 1.014 08/26/2021 1736   PHURINE 6.0 08/26/2021 1736   GLUCOSEU NEGATIVE 08/26/2021 1736   HGBUR NEGATIVE 08/26/2021 1736   BILIRUBINUR NEGATIVE 08/26/2021 1736   KETONESUR 5 (A) 08/26/2021 1736   PROTEINUR NEGATIVE 08/26/2021 1736   NITRITE NEGATIVE 08/26/2021 1736   LEUKOCYTESUR NEGATIVE 08/26/2021 1736   Sepsis Labs Invalid input(s): PROCALCITONIN,  WBC,  LACTICIDVEN Microbiology No results found for this or any previous visit (from the past 240 hour(s)).  Please note: You were cared for by a hospitalist during your hospital stay. Once you are discharged, your primary care physician will handle any further medical issues. Please note that NO REFILLS for any discharge medications will be authorized once you are discharged, as it is imperative that you return to your primary care physician (or establish a relationship with a primary care physician if you do not have one) for your post hospital discharge needs so that they can reassess your need for medications and monitor your lab values.    Time coordinating discharge: 40 minutes  SIGNED:   Shelly Coss, MD  [facility] 08/28/2021, 10:34  AM Pager [phone]  If 7PM-7AM, please contact night-coverage [email or web address] Password [email or web address]

## 2021-08-28 NOTE — Evaluation (Signed)
Occupational Therapy Evaluation Patient Details Name: Olivia Ross MRN: [id number] DOB: 1945-08-08 Today's Date: 08/28/2021   History of Present Illness 76 year old African-American female presents to ER with AMS. Dtr found pt slumped to the side, non verbal and unable to follow commands MRI neg for acute stroke but shows previous infarcts. PMH: hypertension, dementia   Clinical Impression   Pt reports requiring assistance from daughter at baseline for ADLs, uses RW for functional mobility. Lives with daughter in basement level apartment. Pt currently requiring min-max A +2 for ADLs, mod A for bed mobility, and max A +2 for transfers with and without RW. Pt with retropulsion and posterior lean with standing, requiring max A +2 to recorrect. Difficulty initiating, and sequencing standing ADL task at sink during session. Pt presenting with impairments listed below, will follow acutely. Recommend HHOT at d/c.      Recommendations for follow up therapy are one component of a multi-disciplinary discharge planning process, led by the attending physician.  Recommendations may be updated based on patient status, additional functional criteria and insurance authorization.   Follow Up Recommendations  Home health OT    Assistance Recommended at Discharge Frequent or constant Supervision/Assistance  Patient can return home with the following A lot of help with walking and/or transfers;A lot of help with bathing/dressing/bathroom;Assistance with cooking/housework;Direct supervision/assist for medications management;Direct supervision/assist for financial management;Assist for transportation;Help with stairs or ramp for entrance    Functional Status Assessment  Patient has had a recent decline in their functional status and demonstrates the ability to make significant improvements in function in a reasonable and predictable amount of time.  Equipment Recommendations  None recommended by OT;Other  (comment) (pt has all needed DME)    Recommendations for Other Services PT consult     Precautions / Restrictions Precautions Precautions: Fall Precaution Comments: dementia, watch HR Restrictions Weight Bearing Restrictions: No      Mobility Bed Mobility Overal bed mobility: Needs Assistance Bed Mobility: Supine to Sit     Supine to sit: Mod assist, HOB elevated, Max assist     General bed mobility comments: max directional verbal and tactile cues, pt hesitant to move, initiated LE movement but ultimately requiring mod/maxA for trunk elevation and to bring LEs off EOB and scoot until feet on the floor    Transfers Overall transfer level: Needs assistance Equipment used: 2 person hand held assist Transfers: Sit to/from Stand Sit to Stand: Max assist, +2 physical assistance                  Balance Overall balance assessment: Needs assistance Sitting-balance support: Feet supported, No upper extremity supported Sitting balance-Leahy Scale: Fair     Standing balance support: During functional activity, Reliant on assistive device for balance, Single extremity supported Standing balance-Leahy Scale: Zero Standing balance comment: requiring max posterior support/posterior lean                           ADL either performed or assessed with clinical judgement   ADL Overall ADL's : Needs assistance/impaired Eating/Feeding: Sitting;Supervision/ safety   Grooming: Minimal assistance;Moderate assistance;Standing Grooming Details (indicate cue type and reason): brushing teeth at sink Upper Body Bathing: Moderate assistance;Sitting   Lower Body Bathing: Moderate assistance;Sitting/lateral leans;Sit to/from stand   Upper Body Dressing : Minimal assistance;Standing;Sitting   Lower Body Dressing: Moderate assistance;Sitting/lateral leans;Sit to/from stand   Toilet Transfer: Maximal assistance;+2 for physical assistance   Toileting- Clothing Manipulation  and Hygiene: Maximal  assistance;Sitting/lateral lean       Functional mobility during ADLs: Maximal assistance;+2 for physical assistance       Vision   Additional Comments: difficulty identifying items when asked to gather during ADL session, will further assess, could be cognition vs vision     Perception     Praxis Praxis Praxis-Other Comments: needing step by step cues to perform ADL task at sink    Pertinent Vitals/Pain Pain Assessment Pain Assessment: No/denies pain     Hand Dominance Right   Extremity/Trunk Assessment Upper Extremity Assessment Upper Extremity Assessment: Generalized weakness   Lower Extremity Assessment Lower Extremity Assessment: Defer to PT evaluation   Cervical / Trunk Assessment Cervical / Trunk Assessment: Kyphotic   Communication Communication Communication: No difficulties   Cognition Arousal/Alertness: Awake/alert Behavior During Therapy: WFL for tasks assessed/performed Overall Cognitive Status: History of cognitive impairments - at baseline                                 General Comments: A&Ox3, pleasant, difficulty with motor planning and following single step instruction, likely near baseline with dementia hx     General Comments  per RN pt tachycardic (HR 200's) during session, returned WNL once seated in chair    Exercises     Shoulder Instructions      Home Living Family/patient expects to be discharged to:: Private residence Living Arrangements: Children Available Help at Discharge: Family;Available PRN/intermittently (daughter works per pt but is there with her most of the time) Type of Home: Apartment Home Access: Stairs to enter Technical brewer of Steps: flight Entrance Stairs-Rails: Right Home Layout: One level     Bathroom Shower/Tub: Occupational psychologist: Standard     Home Equipment: Conservation officer, nature (2 wheels);Shower seat   Additional Comments: pt provided home set up  and it corresponded with previous admit in 01/2021      Prior Functioning/Environment Prior Level of Function : Needs assist             Mobility Comments: uses RW with assist of dtr per RN ADLs Comments: pt reports her dtr helps her with ADLs        OT Problem List: Decreased strength;Decreased activity tolerance;Decreased range of motion;Impaired balance (sitting and/or standing);Impaired vision/perception;Decreased cognition;Decreased safety awareness      OT Treatment/Interventions: Self-care/ADL training;Therapeutic exercise;DME and/or AE instruction;Therapeutic activities;Balance training;Patient/family education    OT Goals(Current goals can be found in the care plan section) Acute Rehab OT Goals Patient Stated Goal: none stated OT Goal Formulation: With patient Time For Goal Achievement: 09/11/21 Potential to Achieve Goals: Good ADL Goals Pt Will Perform Grooming: with caregiver independent in assisting;with supervision;standing;sitting Pt Will Perform Upper Body Dressing: with min assist;standing;sitting;with caregiver independent in assisting Pt Will Perform Lower Body Dressing: with min assist;sitting/lateral leans;sit to/from stand;with caregiver independent in assisting;with mod assist Pt Will Transfer to Toilet: with mod assist;with min assist;regular height toilet;bedside commode Pt Will Perform Tub/Shower Transfer: with min assist;with mod assist;ambulating;rolling walker  OT Frequency: Min 2X/week    Co-evaluation PT/OT/SLP Co-Evaluation/Treatment: Yes Reason for Co-Treatment: Complexity of the patient's impairments (multi-system involvement);For patient/therapist safety;To address functional/ADL transfers PT goals addressed during session: Mobility/safety with mobility        AM-PAC OT "6 Clicks" Daily Activity     Outcome Measure Help from another person eating meals?: None Help from another person taking care of personal grooming?: A Lot Help from  another person toileting, which includes using toliet, bedpan, or urinal?: A Lot Help from another person bathing (including washing, rinsing, drying)?: A Lot Help from another person to put on and taking off regular upper body clothing?: A Lot Help from another person to put on and taking off regular lower body clothing?: A Lot 6 Click Score: 14   End of Session Equipment Utilized During Treatment: Gait belt;Rolling walker (2 wheels) Nurse Communication: Mobility status  Activity Tolerance: Patient tolerated treatment well Patient left: in chair;with call bell/phone within reach;with chair alarm set  OT Visit Diagnosis: Unsteadiness on feet (R26.81);Other abnormalities of gait and mobility (R26.89);Muscle weakness (generalized) (M62.81);Other symptoms and signs involving cognitive function                Time: 6546-5035 OT Time Calculation (min): 27 min Charges:  OT General Charges $OT Visit: 1 Visit OT Evaluation $OT Eval Moderate Complexity: 1 [address] OTD, OTR/L Acute Rehab [phone]) [phone] - [place] 08/28/2021, 10:52 AM

## 2021-08-28 NOTE — TOC Transition Note (Signed)
Transition of Care [facility]) - CM/SW Discharge Note   Patient Details  Name: Olivia Ross MRN: [id number] Date of Birth: 1945/11/10  [facility] CM/SW Contact:  Geralynn Ochs, LCSW Phone Number: 08/28/2021, 3:21 PM   Clinical Narrative:   CSW left a voicemail earlier today for daughter to call back about discharge. Daughter called back, and CSW discussed home health care. Daughter admitted she wasn't sure how helpful it would be, but couldn't hurt to try. Daughter prefers [facility] for home health. CSW sent referral and [facility] can accept. Daughter requested transport home with [facility] due to patient's inability to walk. Transport arranged with [facility] for next available. No other needs identified at this time.    Final next level of care: [place] Barriers to Discharge: Barriers Resolved   Patient Goals and CMS Choice Patient states their goals for this hospitalization and ongoing recovery are:: patient unable to participate in goal setting CMS Medicare.gov Compare Post Acute Care list provided to:: Patient Represenative (must comment) Choice offered to / list presented to : Adult Children  Discharge Placement                Patient to be transferred to facility by: [place] Name of family member notified: Colletta [place] Patient and family notified of of transfer: 08/28/21  Discharge Plan and Services                                     Social Determinants of Health (SDOH) Interventions     Readmission Risk Interventions     View : No data to display.

## 2021-08-28 NOTE — Care Management Obs Status (Signed)
[place] NOTIFICATION   Patient Details  Name: Copeland Lapier MRN: [id number] Date of Birth: 1945-07-24   Medicare Observation Status Notification Given:  Yes    Geralynn Ochs, LCSW 08/28/2021, 3:23 PM

## 2021-08-28 NOTE — Evaluation (Addendum)
Physical Therapy Evaluation Patient Details Name: Olivia Ross MRN: [id number] DOB: Jun 18, 1945 Today's Date: 08/28/2021  History of Present Illness  76 year old African-American female presents to ER with AMS. Dtr found pt slumped to the side, non verbal and unable to follow commands MRI neg for acute stroke but shows previous infarcts. PMH: hypertension, dementia   Clinical Impression  Pt admitted with above. Suspect pt is near baseline with her history of dementia however unable to reach daughter to verify patients PLOF both cognitively and functionally. Per RN, dtr reports that the patient will walk with a walker with her but doesn't initiate any movement on her own. Per pt, pt walks with a walker and the dtr does all her ADLs. Pt requiring maxA for ambulation this date due to retropulsion but suspect this due to fear and not being familiar with environment. Pt pleasant and cooperative but had difficulty with sequencing and motor planning and requiring maxA to maintain standing to attempt ADLs at sink. With the suspicion pt is near or at baseline pt would be safe to d/c home with daughter, however will await until we can reach daughter to make final d/c recommendation. Acute PT to cont to follow.       Recommendations for follow up therapy are one component of a multi-disciplinary discharge planning process, led by the attending physician.  Recommendations may be updated based on patient status, additional functional criteria and insurance authorization.  Follow Up Recommendations Home health PT (pending report from dtr regarding pt's PLOF, called and left message with dtr)    Assistance Recommended at Discharge Frequent or constant Supervision/Assistance  Patient can return home with the following  A lot of help with walking and/or transfers;A lot of help with bathing/dressing/bathroom;Assistance with cooking/housework;Assistance with feeding;Direct supervision/assist for medications  management;Direct supervision/assist for financial management;Assist for transportation;Help with stairs or ramp for entrance    Equipment Recommendations None recommended by PT  Recommendations for Other Services       Functional Status Assessment Patient has had a recent decline in their functional status and demonstrates the ability to make significant improvements in function in a reasonable and predictable amount of time.     Precautions / Restrictions Precautions Precautions: Fall Precaution Comments: dementia Restrictions Weight Bearing Restrictions: No      Mobility  Bed Mobility Overal bed mobility: Needs Assistance Bed Mobility: Supine to Sit     Supine to sit: Mod assist, HOB elevated, Max assist     General bed mobility comments: max directional verbal and tactile cues, pt hesitant to move, initiated LE movement but ultimately requiring mod/maxA for trunk elevation and to bring LEs off EOB and scoot until feet on the floor    Transfers Overall transfer level: Needs assistance Equipment used: 2 person hand held assist Transfers: Sit to/from Stand Sit to Stand: Max assist, +2 physical assistance           General transfer comment: pt with retropulsion, suspect due to fear of falling, pt requring max tactile cues to bring trunk forward and then maxAX2 to power up due to retropulsion, pt then given RW however pt pushing self strongly posteriorly requiring maxA tactile cues to stay in the walker and upright    Ambulation/Gait Ambulation/Gait assistance: Max assist, +2 physical assistance Gait Distance (Feet): 3 Feet (x1 with RW to sink, 20' with Bilat HHA) Assistive device: Rolling walker (2 wheels), 2 person hand held assist Gait Pattern/deviations: Step-through pattern, Decreased stride length, Scissoring Gait velocity: decreasedc Gait velocity interpretation: <  1.8 ft/sec, indicate of risk for recurrent falls   General Gait Details: pt requiring maxAX2 for  amb with RW, pt unable to manage RW properly and very retropulsive. Pt then provided with bilat HHA and arms crossed in the back to allow for inscreased pelvis support and anterior weight shift to prevent retropulsion, with continued anterior movement provide by PT/OT pt able to amb 20', bilat LEs locked in extension and near scissoring  Stairs            Wheelchair Mobility    Modified Rankin (Stroke Patients Only) Modified Rankin (Stroke Patients Only) Pre-Morbid Rankin Score: Moderately severe disability Modified Rankin: Moderately severe disability     Balance Overall balance assessment: Needs assistance Sitting-balance support: Feet supported, No upper extremity supported Sitting balance-Leahy Scale: Fair     Standing balance support: During functional activity, Reliant on assistive device for balance, Single extremity supported Standing balance-Leahy Scale: Zero Standing balance comment: pt dependent on PT support posteriorly to work with OT at the sink to brush teath. pt would only use R UE and was unable to use bilat UEs due to supporting self on L UE.                             Pertinent Vitals/Pain Pain Assessment Pain Assessment: No/denies pain    Home Living Family/patient expects to be discharged to:: Private residence Living Arrangements: Children Available Help at Discharge: Family;Available PRN/intermittently (pt reports dtr is there "most of the time") Type of Home: Apartment Home Access: Stairs to enter Entrance Stairs-Rails: Right (going down) Entrance Stairs-Number of Steps: flight   Home Layout: One level Home Equipment: Conservation officer, nature (2 wheels);Shower seat Additional Comments: pt provided home set up and it corresponded with previous admit in 01/2021    Prior Function Prior Level of Function : Needs assist (unsure of accuracy, attempted to call dtr however unable to reach, message left)             Mobility Comments: uses RW  with assist of dtr per RN ADLs Comments: pt reports her dtr helps her with everything     Hand Dominance   Dominant Hand: Right    Extremity/Trunk Assessment   Upper Extremity Assessment Upper Extremity Assessment: Defer to OT evaluation    Lower Extremity Assessment Lower Extremity Assessment: Generalized weakness    Cervical / Trunk Assessment Cervical / Trunk Assessment: Kyphotic  Communication   Communication: No difficulties  Cognition Arousal/Alertness: Awake/alert Behavior During Therapy: WFL for tasks assessed/performed Overall Cognitive Status: History of cognitive impairments - at baseline                                 General Comments: pt oriented x 3 today, pleasant and conversant, pt with difficulty following commands, demo'd impaired processing, motor planning and sequencing. Suspect this is near patient's baseline with known dementia. Unable to reach dtr to verify        General Comments General comments (skin integrity, edema, etc.): per RN, HR increased to 200 during activity but quickly returned to normal upon sitting in chair    Exercises     Assessment/Plan    PT Assessment Patient needs continued PT services  PT Problem List Decreased strength;Decreased activity tolerance;Decreased balance;Decreased mobility;Decreased cognition;Decreased knowledge of use of DME       PT Treatment Interventions DME instruction;Gait training;Stair training;Functional mobility training;Therapeutic  activities;Therapeutic exercise;Balance training;Cognitive remediation;Neuromuscular re-education    PT Goals (Current goals can be found in the Care Plan section)  Acute Rehab PT Goals PT Goal Formulation: Patient unable to participate in goal setting Time For Goal Achievement: 09/12/21 Potential to Achieve Goals: Good    Frequency Min 4X/week     Co-evaluation PT/OT/SLP Co-Evaluation/Treatment: Yes (+2 for ambulation) Reason for Co-Treatment:  Complexity of the patient's impairments (multi-system involvement) PT goals addressed during session: Mobility/safety with mobility         AM-PAC PT "6 Clicks" Mobility  Outcome Measure Help needed turning from your back to your side while in a flat bed without using bedrails?: A Lot Help needed moving from lying on your back to sitting on the side of a flat bed without using bedrails?: A Lot Help needed moving to and from a bed to a chair (including a wheelchair)?: A Lot Help needed standing up from a chair using your arms (e.g., wheelchair or bedside chair)?: A Lot Help needed to walk in hospital room?: A Lot Help needed climbing 3-5 steps with a railing? : Total 6 Click Score: 11    End of Session Equipment Utilized During Treatment: Gait belt Activity Tolerance: Patient tolerated treatment well Patient left: in chair;with call bell/phone within reach;with chair alarm set Nurse Communication: Mobility status (need for purwick placement) PT Visit Diagnosis: Unsteadiness on feet (R26.81);Muscle weakness (generalized) (M62.81);Difficulty in walking, not elsewhere classified (R26.2)    Time: 4944-9675 PT Time Calculation (min) (ACUTE ONLY): 26 min   Charges:   PT Evaluation $PT Eval Moderate Complexity: 1 Mod          Kittie Plater, PT, DPT Acute Rehabilitation Services Secure chat preferred Office #: [phone]   Berline Lopes 08/28/2021, 9:27 AM

## 2021-11-06 ENCOUNTER — Ambulatory Visit: Payer: Medicare PPO | Admitting: Neurology

## 2021-11-06 ENCOUNTER — Encounter: Payer: Self-pay | Admitting: Neurology

## 2022-02-02 ENCOUNTER — Inpatient Hospital Stay (HOSPITAL_COMMUNITY)
Admission: EM | Admit: 2022-02-02 | Discharge: 2022-02-05 | DRG: 064 | Disposition: A | Discharge status: Home Health | Payer: Medicare PPO | Attending: Internal Medicine | Admitting: Internal Medicine

## 2022-02-02 ENCOUNTER — Other Ambulatory Visit: Payer: Self-pay

## 2022-02-02 ENCOUNTER — Emergency Department (HOSPITAL_COMMUNITY): Payer: Medicare PPO

## 2022-02-02 DIAGNOSIS — C50411 Malignant neoplasm of upper-outer quadrant of right female breast: Secondary | ICD-10-CM

## 2022-02-02 DIAGNOSIS — Z87891 Personal history of nicotine dependence: Secondary | ICD-10-CM

## 2022-02-02 DIAGNOSIS — R4182 Altered mental status, unspecified: Secondary | ICD-10-CM | POA: Diagnosis not present

## 2022-02-02 DIAGNOSIS — I639 Cerebral infarction, unspecified: Secondary | ICD-10-CM | POA: Diagnosis present

## 2022-02-02 DIAGNOSIS — G936 Cerebral edema: Secondary | ICD-10-CM | POA: Diagnosis present

## 2022-02-02 DIAGNOSIS — I10 Essential (primary) hypertension: Secondary | ICD-10-CM | POA: Diagnosis present

## 2022-02-02 DIAGNOSIS — R404 Transient alteration of awareness: Principal | ICD-10-CM

## 2022-02-02 DIAGNOSIS — F0150 Vascular dementia without behavioral disturbance: Secondary | ICD-10-CM | POA: Diagnosis present

## 2022-02-02 DIAGNOSIS — Z9011 Acquired absence of right breast and nipple: Secondary | ICD-10-CM

## 2022-02-02 DIAGNOSIS — Z887 Allergy status to serum and vaccine status: Secondary | ICD-10-CM

## 2022-02-02 DIAGNOSIS — Z853 Personal history of malignant neoplasm of breast: Secondary | ICD-10-CM

## 2022-02-02 DIAGNOSIS — Z885 Allergy status to narcotic agent status: Secondary | ICD-10-CM

## 2022-02-02 DIAGNOSIS — Z681 Body mass index (BMI) 19 or less, adult: Secondary | ICD-10-CM

## 2022-02-02 DIAGNOSIS — I63422 Cerebral infarction due to embolism of left anterior cerebral artery: Principal | ICD-10-CM | POA: Diagnosis present

## 2022-02-02 DIAGNOSIS — Z1152 Encounter for screening for COVID-19: Secondary | ICD-10-CM

## 2022-02-02 DIAGNOSIS — Z8673 Personal history of transient ischemic attack (TIA), and cerebral infarction without residual deficits: Secondary | ICD-10-CM

## 2022-02-02 DIAGNOSIS — F0390 Unspecified dementia without behavioral disturbance: Secondary | ICD-10-CM | POA: Diagnosis present

## 2022-02-02 DIAGNOSIS — Z79899 Other long term (current) drug therapy: Secondary | ICD-10-CM

## 2022-02-02 DIAGNOSIS — E43 Unspecified severe protein-calorie malnutrition: Secondary | ICD-10-CM | POA: Diagnosis present

## 2022-02-02 DIAGNOSIS — E785 Hyperlipidemia, unspecified: Secondary | ICD-10-CM | POA: Diagnosis present

## 2022-02-02 DIAGNOSIS — Z170 Estrogen receptor positive status [ER+]: Secondary | ICD-10-CM

## 2022-02-02 DIAGNOSIS — Z79811 Long term (current) use of aromatase inhibitors: Secondary | ICD-10-CM

## 2022-02-02 DIAGNOSIS — R4781 Slurred speech: Secondary | ICD-10-CM | POA: Diagnosis present

## 2022-02-02 DIAGNOSIS — R29713 NIHSS score 13: Secondary | ICD-10-CM | POA: Diagnosis present

## 2022-02-02 HISTORY — DX: Cerebral infarction, unspecified: I63.9

## 2022-02-02 LAB — URINALYSIS, ROUTINE W REFLEX MICROSCOPIC
Bilirubin Urine: NEGATIVE
Glucose, UA: NEGATIVE mg/dL
Hgb urine dipstick: NEGATIVE
Ketones, ur: 5 mg/dL — AB
Leukocytes,Ua: NEGATIVE
Nitrite: NEGATIVE
Protein, ur: NEGATIVE mg/dL
Specific Gravity, Urine: 1.018 (ref 1.005–1.030)
pH: 5 (ref 5.0–8.0)

## 2022-02-02 LAB — I-STAT CHEM 8, ED
BUN: 18 mg/dL (ref 8–23)
Calcium, Ion: 1.29 mmol/L (ref 1.15–1.40)
Chloride: 108 mmol/L (ref 98–111)
Creatinine, Ser: 0.7 mg/dL (ref 0.44–1.00)
Glucose, Bld: 85 mg/dL (ref 70–99)
HCT: 38 % (ref 36.0–46.0)
Hemoglobin: 12.9 g/dL (ref 12.0–15.0)
Potassium: 4.1 mmol/L (ref 3.5–5.1)
Sodium: 143 mmol/L (ref 135–145)
TCO2: 24 mmol/L (ref 22–32)

## 2022-02-02 LAB — CBC WITH DIFFERENTIAL/PLATELET
Abs Immature Granulocytes: 0.04 10*3/uL (ref 0.00–0.07)
Basophils Absolute: 0.1 10*3/uL (ref 0.0–0.1)
Basophils Relative: 0 %
Eosinophils Absolute: 0.1 10*3/uL (ref 0.0–0.5)
Eosinophils Relative: 1 %
HCT: 37.4 % (ref 36.0–46.0)
Hemoglobin: 12.3 g/dL (ref 12.0–15.0)
Immature Granulocytes: 0 %
Lymphocytes Relative: 23 %
Lymphs Abs: 2.6 10*3/uL (ref 0.7–4.0)
MCH: 31.9 pg (ref 26.0–34.0)
MCHC: 32.9 g/dL (ref 30.0–36.0)
MCV: 97.1 fL (ref 80.0–100.0)
Monocytes Absolute: 0.5 10*3/uL (ref 0.1–1.0)
Monocytes Relative: 5 %
Neutro Abs: 8.2 10*3/uL — ABNORMAL HIGH (ref 1.7–7.7)
Neutrophils Relative %: 71 %
Platelets: 143 10*3/uL — ABNORMAL LOW (ref 150–400)
RBC: 3.85 MIL/uL — ABNORMAL LOW (ref 3.87–5.11)
RDW: 14.1 % (ref 11.5–15.5)
WBC: 11.6 10*3/uL — ABNORMAL HIGH (ref 4.0–10.5)
nRBC: 0 % (ref 0.0–0.2)

## 2022-02-02 LAB — COMPREHENSIVE METABOLIC PANEL
ALT: 28 U/L (ref 0–44)
AST: 32 U/L (ref 15–41)
Albumin: 3.2 g/dL — ABNORMAL LOW (ref 3.5–5.0)
Alkaline Phosphatase: 99 U/L (ref 38–126)
Anion gap: 8 (ref 5–15)
BUN: 16 mg/dL (ref 8–23)
CO2: 23 mmol/L (ref 22–32)
Calcium: 9.7 mg/dL (ref 8.9–10.3)
Chloride: 110 mmol/L (ref 98–111)
Creatinine, Ser: 0.71 mg/dL (ref 0.44–1.00)
GFR, Estimated: >60 mL/min (ref >60)
Glucose, Bld: 89 mg/dL (ref 70–99)
Potassium: 3.8 mmol/L (ref 3.5–5.1)
Sodium: 141 mmol/L (ref 135–145)
Total Bilirubin: 0.9 mg/dL (ref 0.3–1.2)
Total Protein: 5.9 g/dL — ABNORMAL LOW (ref 6.5–8.1)

## 2022-02-02 LAB — CBG MONITORING, ED: Glucose-Capillary: 89 mg/dL (ref 70–99)

## 2022-02-02 LAB — ETHANOL: Alcohol, Ethyl (B): <10 mg/dL (ref <10)

## 2022-02-02 LAB — TROPONIN I (HIGH SENSITIVITY): Troponin I (High Sensitivity): 14 ng/L (ref <18)

## 2022-02-02 NOTE — ED Notes (Signed)
Olivia Ross, Olivia A, PA-C notified pt passed swallowed screen and Modified SS Total +: 11Complete NIHSS TOTAL: 13. Hard to do assessment as pt is not understanding commands and directions.

## 2022-02-02 NOTE — ED Provider Notes (Signed)
[facility] Provider Note   CSN: [id number] Arrival date & time: 02/02/22  2103     History  Chief Complaint  Patient presents with   Altered Mental Status    Olivia Ross is a 76 y.o. 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  Level 5 caveat.  History provided by daughter.  I spoke with the patient's daughter reports that her mother lives with her.  She is demented and usually oriented to herself only but is able to state her birthday, address, name other individuals and say where she is from.  This evening the daughter went to check on her mother and noted that she was slumped over forward.  She woke her up and she thought her mother's speech was slurred and she was having some difficulty finding words that she usually is able to use.  She also was unable to tell her daughter her name, where she is from, her birthday or other items that she knows despite her dementia.  Daughter reports that the last time she spoke with her and saw her normal was around noon.   Altered Mental Status      Home Medications Prior to Admission medications   Medication Sig Start Date End Date Taking? Authorizing Provider  acetaminophen (TYLENOL) 500 MG tablet Take 500 mg by mouth every 6 (six) hours as needed for moderate pain.    [provider]  amLODipine (NORVASC) 5 MG tablet Take 1 tablet (5 mg total) by mouth daily. 08/28/21   Shelly Coss, MD  atorvastatin (LIPITOR) 80 MG tablet Take 1 tablet (80 mg total) by mouth daily. 08/29/21   Shelly Coss, MD  carboxymethylcellulose (REFRESH PLUS) 0.5 % SOLN Place 1 drop into both eyes 3 (three) times daily as needed (dry eyes).    [provider]  dicyclomine (BENTYL) 10 MG capsule Take 10 mg by mouth daily as needed for spasms. 07/24/20   [provider]  ferrous sulfate 325 (65 FE) MG tablet Take 1 tablet  (325 mg total) by mouth daily. 08/31/20   Delora Fuel, MD  letrozole [facility]) 2.5 MG tablet Take 2.5 mg by mouth daily. 12/12/20   [provider]  loperamide (IMODIUM A-D) 2 MG tablet Take 2 mg by mouth 4 (four) times daily as needed for diarrhea or loose stools.    [provider]  magnesium oxide (MAG-OX) 400 (240 Mg) MG tablet Take 0.5 tablets (200 mg total) by mouth 2 (two) times daily. 12/22/20   Regalado, Belkys A, MD  Multiple Vitamin (MULTIVITAMIN WITH MINERALS) TABS tablet Take 1 tablet by mouth daily. Centrum    [provider]  valsartan (DIOVAN) 160 MG tablet Take 160 mg by mouth daily. 12/12/20   [provider]  vitamin B-12 100 MCG tablet Take 1 tablet (100 mcg total) by mouth daily. 12/23/20   Regalado, Jerald Kief A, MD      Allergies    Diphth-acell pertussis-tetanus, Dtap-hepatitis b recomb-ipv, Norco [hydrocodone-acetaminophen], and Percocet [oxycodone-acetaminophen]    Review of Systems   Review of Systems  Physical Exam Updated Vital Signs BP (!) 143/52 (BP Location: Left Arm)   Pulse (!) 41   Temp 98 F (36.7 C) (Oral)   Resp 16   SpO2 90%  Physical Exam Vitals and nursing note reviewed.  Constitutional:      General: She is not in acute distress.  Appearance: Normal appearance. She is not ill-appearing.  HENT:     Head: Normocephalic and atraumatic.  Eyes:     General: No scleral icterus.    Conjunctiva/sclera: Conjunctivae normal.  Cardiovascular:     Rate and Rhythm: Regular rhythm. Bradycardia present.  Pulmonary:     Effort: Pulmonary effort is normal. No respiratory distress.  Skin:    Findings: No rash.  Neurological:     Mental Status: She is alert.     Comments: Cranial nerves II through XII intact.  Some difficulty with EOMs that appears to be secondary to confusion of the instructions.  Full range of motion of bilateral upper and lower extremities.  Normal strength to finger grip bilaterally.  Normal strength to  dorsiflexion bilaterally  Psychiatric:        Mood and Affect: Mood normal.     ED Results / Procedures / Treatments   Labs (all labs ordered are listed, but only abnormal results are displayed) Labs Reviewed  COMPREHENSIVE METABOLIC PANEL - Abnormal; Notable for the following components:      Result Value   Total Protein 5.9 (*)    Albumin 3.2 (*)    All other components within normal limits  CBC WITH DIFFERENTIAL/PLATELET - Abnormal; Notable for the following components:   WBC 11.6 (*)    RBC 3.85 (*)    Platelets 143 (*)    Neutro Abs 8.2 (*)    All other components within normal limits  RESP PANEL BY RT-PCR (FLU A&B, COVID) ARPGX2  ETHANOL  URINALYSIS, ROUTINE W REFLEX MICROSCOPIC  PROTIME-INR  APTT  URINALYSIS, ROUTINE W REFLEX MICROSCOPIC  CBG MONITORING, ED  I-STAT CHEM 8, ED  TROPONIN I (HIGH SENSITIVITY)  TROPONIN I (HIGH SENSITIVITY)    EKG None  Radiology CT Head Wo Contrast  Result Date: 02/02/2022 CLINICAL DATA:  Altered mental status. EXAM: CT HEAD WITHOUT CONTRAST TECHNIQUE: Contiguous axial images were obtained from the base of the skull through the vertex without intravenous contrast. RADIATION DOSE REDUCTION: This exam was performed according to the departmental dose-optimization program which includes automated exposure control, adjustment of the mA and/or kV according to patient size and/or use of iterative reconstruction technique. COMPARISON:  July 07, 2021 FINDINGS: Brain: There is mild cerebral atrophy with widening of the extra-axial spaces and ventricular dilatation. There are areas of decreased attenuation within the white matter tracts of the supratentorial brain, consistent with microvascular disease changes. Chronic left frontal lobe, right temporal lobe and right parieto-occipital infarcts are noted. Chronic bilateral basal ganglia lacunar infarcts are also seen. Vascular: There is marked severity calcification of the bilateral cavernous carotid  arteries. Skull: Normal. Negative for fracture or focal lesion. Sinuses/Orbits: No acute finding. Other: None. IMPRESSION: 1. Generalized cerebral atrophy and chronic white matter small vessel ischemic changes without evidence of an acute intracranial abnormality. 2. Chronic left frontal lobe, right temporal lobe and right parieto-occipital infarcts. Electronically Signed   By: Virgina Norfolk M.D.   On: 02/02/2022 22:28    Procedures Procedures   Medications Ordered in ED Medications - No data to display  ED Course/ Medical Decision Making/ A&P Clinical Course as of 02/02/22 2339  Sat Feb 02, 2022  2246 [phone] stephanie daughter [MR]  2337 I again spoke with the patient daughter about work-up thus far.  She understands we are waiting for urinalysis but head CT looks stable from prior.  She reports that she is in route to the hospital. [MR]    Clinical Course User  Index [MR] Seymour Pavlak, Cecilio Asper, PA-C                           Medical Decision Making Amount and/or Complexity of Data Reviewed Labs: ordered. Radiology: ordered.   This is a 76 year old female who presents today with daughter's concern for altered mental status.  Differential includes but is not limited to CVA, intoxication, malignancy, delirium.   This is not an exhaustive differential.    Past Medical History / Co-morbidities / Social History: History of CVA, right MCA 2012   Additional history:  HPI provided by daughter.  She is more concerned for CVA than UTI.  Reports that this decline happened acutely and has not been noted prior to this evening.  Was normal at noon.  Physical Exam: Pertinent physical exam findings include Relatively within normal limits.  Patient does not appear to fully understand the request on neurologic exam.  EOMs intermittently intact however patient appears to get distracted during this exam PERRLA  Lab Tests: I ordered, and personally interpreted labs.  The pertinent results  include: Mild leukocytosis 11.6   Imaging Studies: I ordered and independently visualized and interpreted head ct and I agree with the radiologist that there were no acute findings.  Known cerebral atrophy and chronic left frontal, right temporal and right parietal occipital infarcts   Cardiac Monitoring:  The patient was maintained on a cardiac monitor.  I viewed and interpreted the cardiac monitored which showed an underlying rhythm of: Sinus   Medications: Asymptomatic, no complaints, no medications ordered   Consultations Obtained: I spoke with Dr. Rory Percy with neurology.  Plan is to wait for patient's UA.  If this is negative, she will get MRI.  If MRI is normal she can go home.  If UA is revealing of infection then patient does not require MRI imaging.  MDM/Disposition: This is a 76 year old female presenting at the request of her daughter due to altered mental status.  It appears she had a brief period of time where she was able to find her words and slurring her speech.  This resolved within a couple of minutes.  EMS did not note any abnormalities when they arrived after 911 call.  She is alert and oriented to herself which is her usual baseline.  Nonfocal neurologic exam, all abnormalities seem to be secondary to understanding the commands and attention secondary to her dementia.  I spoke with neurology who said MRI would only be needed if her urinalysis is negative.  UA pending at this time.  Patient signed out to oncoming PA-C Olivia Ross.  Please see her chart for remainder of history, work-up and disposition.    I discussed this case with my attending physician Dr. Oswald Hillock who cosigned this note including patient's presenting symptoms, physical exam, and planned diagnostics and interventions. Attending physician stated agreement with plan or made changes to plan which were implemented.      Final Clinical Impression(s) / ED Diagnoses Final diagnoses:  Transient alteration of  awareness    Rx / DC Orders ED Discharge Orders     None        Meleah Demeyer, Cecilio Asper, PA-C 02/02/22 2342    Tretha Sciara, MD 02/03/22 1459

## 2022-02-02 NOTE — ED Triage Notes (Signed)
Pt bib [facility], daughter called EMS due to noticing altered mental status. Daughter states she has been running fevers. Concerned for UTI  138/56 100 O2 20 G LFA CBG 87 Temp. 97.0

## 2022-02-02 NOTE — ED Notes (Signed)
Daughter [phone] stephanie is on her way but would like any updates if possible

## 2022-02-02 NOTE — ED Notes (Signed)
Pt return from CT scanner, NAD noted 

## 2022-02-02 NOTE — ED Notes (Signed)
Pt to CT scanner at this time 

## 2022-02-03 ENCOUNTER — Emergency Department (HOSPITAL_COMMUNITY): Payer: Medicare PPO

## 2022-02-03 ENCOUNTER — Inpatient Hospital Stay (HOSPITAL_COMMUNITY): Payer: Medicare PPO

## 2022-02-03 ENCOUNTER — Encounter (HOSPITAL_COMMUNITY): Payer: Self-pay | Admitting: Internal Medicine

## 2022-02-03 DIAGNOSIS — R29713 NIHSS score 13: Secondary | ICD-10-CM | POA: Diagnosis present

## 2022-02-03 DIAGNOSIS — Z79899 Other long term (current) drug therapy: Secondary | ICD-10-CM | POA: Diagnosis not present

## 2022-02-03 DIAGNOSIS — E785 Hyperlipidemia, unspecified: Secondary | ICD-10-CM | POA: Diagnosis present

## 2022-02-03 DIAGNOSIS — Z79811 Long term (current) use of aromatase inhibitors: Secondary | ICD-10-CM | POA: Diagnosis not present

## 2022-02-03 DIAGNOSIS — Z885 Allergy status to narcotic agent status: Secondary | ICD-10-CM | POA: Diagnosis not present

## 2022-02-03 DIAGNOSIS — I63422 Cerebral infarction due to embolism of left anterior cerebral artery: Secondary | ICD-10-CM | POA: Diagnosis present

## 2022-02-03 DIAGNOSIS — I639 Cerebral infarction, unspecified: Secondary | ICD-10-CM | POA: Diagnosis not present

## 2022-02-03 DIAGNOSIS — Z8673 Personal history of transient ischemic attack (TIA), and cerebral infarction without residual deficits: Secondary | ICD-10-CM | POA: Diagnosis not present

## 2022-02-03 DIAGNOSIS — Z887 Allergy status to serum and vaccine status: Secondary | ICD-10-CM | POA: Diagnosis not present

## 2022-02-03 DIAGNOSIS — Z87891 Personal history of nicotine dependence: Secondary | ICD-10-CM | POA: Diagnosis not present

## 2022-02-03 DIAGNOSIS — Z9011 Acquired absence of right breast and nipple: Secondary | ICD-10-CM | POA: Diagnosis not present

## 2022-02-03 DIAGNOSIS — R0989 Other specified symptoms and signs involving the circulatory and respiratory systems: Secondary | ICD-10-CM

## 2022-02-03 DIAGNOSIS — I10 Essential (primary) hypertension: Secondary | ICD-10-CM | POA: Diagnosis present

## 2022-02-03 DIAGNOSIS — I6389 Other cerebral infarction: Secondary | ICD-10-CM | POA: Diagnosis not present

## 2022-02-03 DIAGNOSIS — Z853 Personal history of malignant neoplasm of breast: Secondary | ICD-10-CM | POA: Diagnosis not present

## 2022-02-03 DIAGNOSIS — G936 Cerebral edema: Secondary | ICD-10-CM | POA: Diagnosis present

## 2022-02-03 DIAGNOSIS — F0150 Vascular dementia without behavioral disturbance: Secondary | ICD-10-CM | POA: Diagnosis present

## 2022-02-03 DIAGNOSIS — E43 Unspecified severe protein-calorie malnutrition: Secondary | ICD-10-CM | POA: Diagnosis present

## 2022-02-03 DIAGNOSIS — R4781 Slurred speech: Secondary | ICD-10-CM | POA: Diagnosis present

## 2022-02-03 DIAGNOSIS — Z170 Estrogen receptor positive status [ER+]: Secondary | ICD-10-CM | POA: Diagnosis not present

## 2022-02-03 DIAGNOSIS — R4182 Altered mental status, unspecified: Secondary | ICD-10-CM | POA: Diagnosis present

## 2022-02-03 DIAGNOSIS — Z681 Body mass index (BMI) 19 or less, adult: Secondary | ICD-10-CM | POA: Diagnosis not present

## 2022-02-03 DIAGNOSIS — Z1152 Encounter for screening for COVID-19: Secondary | ICD-10-CM | POA: Diagnosis not present

## 2022-02-03 LAB — RESP PANEL BY RT-PCR (FLU A&B, COVID) ARPGX2
Influenza A by PCR: NEGATIVE
Influenza B by PCR: NEGATIVE
SARS Coronavirus 2 by RT PCR: NEGATIVE

## 2022-02-03 LAB — HEMOGLOBIN A1C
Hgb A1c MFr Bld: 4.7 % — ABNORMAL LOW (ref 4.8–5.6)
Mean Plasma Glucose: 88.19 mg/dL

## 2022-02-03 MED ORDER — ADULT MULTIVITAMIN W/MINERALS CH
1.0000 | ORAL_TABLET | Freq: Every day | ORAL | Status: DC
  Administered 2022-02-03 – 2022-02-05 (×3): 1 via ORAL
  Filled 2022-02-03 (×3): qty 1

## 2022-02-03 MED ORDER — MAGNESIUM OXIDE -MG SUPPLEMENT 400 (240 MG) MG PO TABS
200.0000 mg | ORAL_TABLET | Freq: Two times a day (BID) | ORAL | Status: DC
  Administered 2022-02-03 – 2022-02-05 (×4): 200 mg via ORAL
  Filled 2022-02-03 (×4): qty 1

## 2022-02-03 MED ORDER — ATORVASTATIN CALCIUM 80 MG PO TABS
80.0000 mg | ORAL_TABLET | Freq: Every day | ORAL | Status: DC
  Administered 2022-02-04 – 2022-02-05 (×2): 80 mg via ORAL
  Filled 2022-02-03 (×3): qty 1

## 2022-02-03 MED ORDER — SENNOSIDES-DOCUSATE SODIUM 8.6-50 MG PO TABS: 1.0000 | ORAL_TABLET | Freq: Every evening | ORAL | Status: DC | PRN

## 2022-02-03 MED ORDER — HALOPERIDOL LACTATE 5 MG/ML IJ SOLN
2.0000 mg | Freq: Four times a day (QID) | INTRAMUSCULAR | Status: DC | PRN
  Administered 2022-02-03: 2 mg via INTRAVENOUS
  Filled 2022-02-03: qty 1

## 2022-02-03 MED ORDER — INFLUENZA VAC A&B SA ADJ QUAD 0.5 ML IM PRSY
0.5000 mL | PREFILLED_SYRINGE | INTRAMUSCULAR | Status: DC
  Filled 2022-02-03: qty 0.5

## 2022-02-03 MED ORDER — DILTIAZEM HCL 25 MG/5ML IV SOLN
10.0000 mg | Freq: Once | INTRAVENOUS | Status: AC
  Administered 2022-02-03: 10 mg via INTRAVENOUS

## 2022-02-03 MED ORDER — QUETIAPINE FUMARATE 25 MG PO TABS
25.0000 mg | ORAL_TABLET | Freq: Every day | ORAL | Status: DC
  Administered 2022-02-04: 25 mg via ORAL
  Filled 2022-02-03 (×2): qty 1

## 2022-02-03 MED ORDER — POLYVINYL ALCOHOL 1.4 % OP SOLN
1.0000 [drp] | OPHTHALMIC | Status: DC | PRN
  Filled 2022-02-03: qty 15

## 2022-02-03 MED ORDER — STROKE: EARLY STAGES OF RECOVERY BOOK
Freq: Once | Status: AC
  Filled 2022-02-03: qty 1

## 2022-02-03 MED ORDER — ACETAMINOPHEN 650 MG RE SUPP: 650.0000 mg | RECTAL | Status: DC | PRN

## 2022-02-03 MED ORDER — CARBOXYMETHYLCELLULOSE SODIUM 0.5 % OP SOLN: 1.0000 [drp] | Freq: Three times a day (TID) | OPHTHALMIC | Status: DC | PRN

## 2022-02-03 MED ORDER — ACETAMINOPHEN 160 MG/5ML PO SOLN: 650.0000 mg | ORAL | Status: DC | PRN

## 2022-02-03 MED ORDER — DICYCLOMINE HCL 10 MG PO CAPS: 10.0000 mg | ORAL_CAPSULE | Freq: Every day | ORAL | Status: DC | PRN

## 2022-02-03 MED ORDER — LORAZEPAM 2 MG/ML IJ SOLN
1.0000 mg | INTRAMUSCULAR | Status: DC | PRN
  Administered 2022-02-03: 1 mg via INTRAVENOUS
  Filled 2022-02-03: qty 1

## 2022-02-03 MED ORDER — ENOXAPARIN SODIUM 40 MG/0.4ML IJ SOSY
40.0000 mg | PREFILLED_SYRINGE | INTRAMUSCULAR | Status: DC
  Administered 2022-02-03 – 2022-02-05 (×3): 40 mg via SUBCUTANEOUS
  Filled 2022-02-03 (×3): qty 0.4

## 2022-02-03 MED ORDER — ACETAMINOPHEN 325 MG PO TABS: 650.0000 mg | ORAL_TABLET | ORAL | Status: DC | PRN

## 2022-02-03 NOTE — Progress Notes (Signed)
Carotid duplex has been completed.   Preliminary results in CV Proc.   Olivia Ross Adrijana Haros 02/03/2022 1:47 PM

## 2022-02-03 NOTE — Consult Note (Addendum)
Neurology Consultation  Reason for Consult: Stroke on MRI  Referring Physician: Dr. Sherre Poot  CC: Altered mental status  History is obtained from:Patient, chart review, family   HPI: Olivia Ross is a 76 y.o. female with a past medical history of dementia and CVA of the right MCA in 2022 presenting today with altered mental status. At baseline she is usually oriented to herself only but is able to state her birthday, address, name other individuals and say where she is from. Her daughter went to check on her in the evening and found her slumped over with slurred speech and word finding difficulties. She could talk but it wasn't making sense. She was also unable to state her name, where she is from, or her birthday which are things she typically knows. She currently reports a headache along the top of her head. She tells me that she uses a wheelchair or a cane to ambulate. She denies recent dizziness, falls, or changes in her appetite. She is not a good historian and is confused.    LKW: 02/02/2022 1200 IV thrombolysis given?: no, outside of window Premorbid modified Rankin scale (mRS):  4-Needs assistance to walk and tend to bodily needs   ROS: Unable to obtain due to altered mental status.   Past Medical History:  Diagnosis Date   Breast cancer ([place])    Dementia ([place])    Hypertension      No family history on file.   Social History:   reports that she has never smoked. She has never used smokeless tobacco. She reports that she does not drink alcohol and does not use drugs.  Medications  Current Facility-Administered Medications:    LORazepam (ATIVAN) injection 1 mg, 1 mg, Intravenous, PRN, Sponseller, Rebekah R, PA-C  Current Outpatient Medications:    acetaminophen (TYLENOL) 500 MG tablet, Take 500 mg by mouth every 6 (six) hours as needed for moderate pain., Disp: , Rfl:    amLODipine (NORVASC) 5 MG tablet, Take 1 tablet (5 mg total) by mouth daily., Disp: 30 tablet,  Rfl: 1   atorvastatin (LIPITOR) 80 MG tablet, Take 1 tablet (80 mg total) by mouth daily., Disp: 30 tablet, Rfl: 1   carboxymethylcellulose (REFRESH PLUS) 0.5 % SOLN, Place 1 drop into both eyes 3 (three) times daily as needed (dry eyes)., Disp: , Rfl:    dicyclomine (BENTYL) 10 MG capsule, Take 10 mg by mouth daily as needed for spasms., Disp: , Rfl:    ferrous sulfate 325 (65 FE) MG tablet, Take 1 tablet (325 mg total) by mouth daily., Disp: 30 tablet, Rfl: 0   letrozole (FEMARA) 2.5 MG tablet, Take 2.5 mg by mouth daily., Disp: , Rfl:    loperamide (IMODIUM A-D) 2 MG tablet, Take 2 mg by mouth 4 (four) times daily as needed for diarrhea or loose stools., Disp: , Rfl:    magnesium oxide (MAG-OX) 400 (240 Mg) MG tablet, Take 0.5 tablets (200 mg total) by mouth 2 (two) times daily., Disp: 15 tablet, Rfl: 0   Multiple Vitamin (MULTIVITAMIN WITH MINERALS) TABS tablet, Take 1 tablet by mouth daily. Centrum, Disp: , Rfl:    valsartan (DIOVAN) 160 MG tablet, Take 160 mg by mouth daily., Disp: , Rfl:    vitamin B-12 100 MCG tablet, Take 1 tablet (100 mcg total) by mouth daily., Disp: 30 tablet, Rfl: 0   Exam: Current vital signs: BP (!) 192/70   Pulse (!) 55   Temp 98.5 F (36.9 C)   Resp 13  SpO2 100%  Vital signs in last 24 hours: Temp:  [98 F (36.7 C)-98.9 F (37.2 C)] 98.5 F (36.9 C) (11/05 0721) Pulse Rate:  [40-86] 55 (11/05 1000) Resp:  [10-25] 13 (11/05 1000) BP: (129-192)/(46-171) 192/70 (11/05 1000) SpO2:  [90 %-100 %] 100 % (11/05 1000)  GENERAL: Awake, alert in NAD HEENT: - Normocephalic and atraumatic, dry mm, no LN++, no Thyromegally LUNGS - Clear to auscultation bilaterally with no wheezes CV - S1S2 RRR, no m/r/g, equal pulses bilaterally. ABDOMEN - Soft, nontender, nondistended with normoactive BS Ext: warm, well perfused, intact peripheral pulses, no edema  NEURO:  Mental Status: Alert and oriented to self and . States it is 2001 and is not sure why she  is in the hospital.  She is able to say that she has not felt good for a while.  Able to follow simple commands with prompting Language: speech is clear.  Naming, repetition, fluency, intact.  Basic language comprehension is intact. Has difficulty following a conversation Cranial Nerves: PERRL, EOMI, visual fields full, no facial asymmetry, facial sensation intact, hearing intact, tongue/uvula/soft palate midline, normal sternocleidomastoid and trapezius muscle strength. No evidence of tongue atrophy or fasciculations  Motor:  RUE 4/5  LUE 5/5 RLE 4/5 LLE 3-/5 Tone: is normal and bulk is normal Sensation- Intact to light touch bilaterally Coordination: Unable able to follow commands to complete, but no gross ataxia noted Gait- Deferred  NIHSS 1a Level of Conscious.: 0 1b LOC Questions: 1 1c LOC Commands: 2 2 Best Gaze: 0 3 Visual: 0 4 Facial Palsy: 0 5a Motor Arm - left: 0 5b Motor Arm - Right: 0 6a Motor Leg - Left: 2 6b Motor Leg - Right: 0 7 Limb Ataxia: 0 8 Sensory: 0 9 Best Language: 0 10 Dysarthria: 0 11 Extinct. and Inatten.: 0 TOTAL: 5   Labs I have reviewed labs in epic and the results pertinent to this consultation are:  CBC    Component Value Date/Time   WBC 11.6 (H) 02/02/2022 2120   RBC 3.85 (L) 02/02/2022 2120   HGB 12.9 02/02/2022 2145   HCT 38.0 02/02/2022 2145   PLT 143 (L) 02/02/2022 2120   MCV 97.1 02/02/2022 2120   MCH 31.9 02/02/2022 2120   MCHC 32.9 02/02/2022 2120   RDW 14.1 02/02/2022 2120   LYMPHSABS 2.6 02/02/2022 2120   MONOABS 0.5 02/02/2022 2120   EOSABS 0.1 02/02/2022 2120   BASOSABS 0.1 02/02/2022 2120    CMP     Component Value Date/Time   NA 143 02/02/2022 2145   K 4.1 02/02/2022 2145   CL 108 02/02/2022 2145   CO2 23 02/02/2022 2120   GLUCOSE 85 02/02/2022 2145   BUN 18 02/02/2022 2145   CREATININE 0.70 02/02/2022 2145   CALCIUM 9.7 02/02/2022 2120   PROT 5.9 (L) 02/02/2022 2120   ALBUMIN 3.2 (L) 02/02/2022 2120   AST  32 02/02/2022 2120   ALT 28 02/02/2022 2120   ALKPHOS 99 02/02/2022 2120   BILITOT 0.9 02/02/2022 2120   GFRNONAA >60 02/02/2022 2120    Lipid Panel     Component Value Date/Time   CHOL 256 (H) 08/27/2021 0907   TRIG 84 08/27/2021 0907   HDL 44 08/27/2021 0907   CHOLHDL 5.8 08/27/2021 0907   VLDL 17 08/27/2021 0907   LDLCALC 195 (H) 08/27/2021 0907     Imaging I have reviewed the images obtained:   MRI examination of the brain 1. Acute left parietal cortex infarct involving  a moderate area. 2. Multiple remote infarcts. 3. Truncated study, only motion degraded diffusion images were acquired.  Assessment: 76 y.o. female with a medical history significant for breast cancer, vascular dementia, seizure, CVA (2022), and HTN presenting with AMS.  - Exam reveals confusion and LLE weakness. NIHSS is 5.  - MRI brain shows an acute left parietal cortex infarct. A large chronic right MCA ischemic infarction predominantly involving the temporal lobe is also noted.  - EKG: Bradycardia with irregular rate - She is currently in atrial fibrillation and will need further stroke work up.  - Not anticoagulated. Also not on an antiplatelet medication at home, but does take high-dose atorvastatin.    Recommendations: - HgbA1c, fasting lipid panel - MRA of the brain without contrast - Frequent neuro checks - Echocardiogram - Carotid dopplers - Antiplatelet med: Aspirin - dose 325 mg PO or 300 mg PR. Also start Plavix 75 mg po qd then stop after 21 days while continuing ASA indefinitely - Risk factor modification - Telemetry monitoring - PT consult, OT consult, Speech consult - Stroke team to follow - BP management. Out of the permissive HTN time window.    -- Patient seen and examined by NP/APP with MD.  Janine Ores, DNP, FNP-BC [facility] Pager: [phone]  I have seen and examined the patient. I have formulated the assessment and recommendations. 76 y.o. female  with a medical history significant for breast cancer, vascular dementia, seizure, CVA (2022), and HTN presenting with confusion, naming deficit and nonsensical speech.  Exam reveals confusion and LLE weakness. NIHSS is 5. MRI brain reveals an acute left parietal lobe medium-sized ischemic infarction, as well as a large chronic right MCA infarction. Recommendations as above.  Electronically signed: Dr. Kerney Elbe

## 2022-02-03 NOTE — ED Provider Notes (Signed)
Accepted handoff at shift change from, Sponsellor PA-C. Please see prior provider note for more detail.   Briefly: Patient is 76 y.o. female with a PMH of CVA, Dementia, Breast Cancer who presents to the ED with a chief complaint of Altered Mental Status with concern for worsening slurred speech and word finding difficulties that was discovered on 11/4 in the early evening.  Workup thus far reveals trace leukocytosis, CMP unremarkable, Respiratory pane negative, troponin negative, glucose normal, ETOH negative, UA without findings of UTI. CT head with only chronic findings of prior infarcts and cerebral atrophy. CXR without acute abnormality. EKG unremarkable.  Interventions: Neuro was consulted and recommended MRI to r/o acute stroke. Patient is reportedly back to baseline.  DDX: concern for CVA  Plan: follow up on MRI brain results. If no concerning findings, plan to discharge home.     Physical Exam  BP (!) 192/70   Pulse (!) 55   Temp 98.5 F (36.9 C)   Resp 13   SpO2 100%   Physical Exam Vitals and nursing note reviewed.  Constitutional:      General: She is not in acute distress.    Appearance: Normal appearance. She is well-developed. She is not ill-appearing, toxic-appearing or diaphoretic.  HENT:     Head: Normocephalic and atraumatic.     Nose: No nasal deformity.     Mouth/Throat:     Lips: Pink. No lesions.  Eyes:     General: Gaze aligned appropriately. No scleral icterus.       Right eye: No discharge.        Left eye: No discharge.     Conjunctiva/sclera: Conjunctivae normal.     Right eye: Right conjunctiva is not injected. No exudate or hemorrhage.    Left eye: Left conjunctiva is not injected. No exudate or hemorrhage. Pulmonary:     Effort: Pulmonary effort is normal. No respiratory distress.  Skin:    General: Skin is warm and dry.  Neurological:     Mental Status: She is alert and oriented to person, place, and time.     Comments: Alert and oriented x  2 Speech slightly dysarthric. Tends to repeat what I say back to me rather than answer questions.  5/5 equal strength in all ext Unable to perform drift or ataxia exams due to cognitive difficulties  Psychiatric:        Mood and Affect: Mood normal.        Speech: Speech normal.        Behavior: Behavior normal. Behavior is cooperative.     Procedures  Procedures  ED Course / MDM   Clinical Course as of 02/03/22 1044  Sat Feb 02, 2022  2246 [phone] stephanie daughter [MR]  2337 I again spoke with the patient daughter about work-up thus far.  She understands we are waiting for urinalysis but head CT looks stable from prior.  She reports that she is in route to the hospital. [MR]  Sun Feb 03, 2022  0133 CXR and KUB ordered for MR clearance per radiology protocol in patient with advanced dementia.  [RS]  3818 MRI concerning for moderate sized acute parietal infarct. Consult to Neuro placed.  [GL]  23 I reconsulted Dr. Cheral Marker regarding new stroke and plan to admit patient. They will see for evaluation.  [GL]  2993 I discussed case with Dr. Lorin Mercy from [facility] who will see patient for evaluation. [GL]    Clinical Course User Index [GL] Jemarcus Dougal, Adora Fridge, PA-C [MR]  Redwine, Madison A, PA-C [RS] Sponseller, Gypsy Balsam, PA-C   Medical Decision Making Amount and/or Complexity of Data Reviewed Labs: ordered. Radiology: ordered.  Risk Prescription drug management. Decision regarding hospitalization.   Workup concerning for acute moderate sized parietal infarct. I reassessed patient and spoke with daughter about results. She has no focal neuro deficits on exam, but does have some continued word finding difficulties and mimicing speech. I reconsulted neurology regarding new stroke and admitted to hospitalist service for further evaluation       Adolphus Birchwood, PA-C 02/03/22 1044    Lacretia Leigh, MD 02/04/22 0945

## 2022-02-03 NOTE — ED Notes (Signed)
MD notified about pts BP 

## 2022-02-03 NOTE — ED Notes (Signed)
Patient transported to MRI 

## 2022-02-03 NOTE — ED Notes (Signed)
RN updated daughter at bedside

## 2022-02-03 NOTE — ED Notes (Signed)
MD states BP goal is to keep under 754 systolic

## 2022-02-03 NOTE — ED Notes (Signed)
Pt saturated in urione, Therapist, sports and student changed pt. New sheet, chucks, purwick, diaper applied. Peri care completed.

## 2022-02-03 NOTE — Progress Notes (Signed)
Back to room. Stat CT and MRI done.

## 2022-02-03 NOTE — H&P (Signed)
History and Physical    Patient: Olivia Ross QJF:[id number] DOB: 31-Dec-1945 DOA: 02/02/2022 DOS: the patient was seen and examined on 02/03/2022 PCP: Jolinda Croak, MD  Patient coming from: Home - lives with daughter; Donald Prose: Daughter, [place]ta [place] [phone]   Chief Complaint: AMS  HPI: Jaysha Lasure is a 76 y.o. female with medical history significant of breast cancer, vascular dementia, seizure, CVA (2022), and HTN presenting with AMS.   The patient is pleasantly confused and unable to provide substantial history.  She does report a headache along the top of her head.  I spoke with her daughter who reports that yesterday she was doing fine.  In the evening about 6pm, she awoke from a nap and she was sitting up in bed (not unusual) and her speech was slurred.  She is usually able to put her words together, communicate - but she didn't know her own name, family names, address.  She could talk but it wasn't making sense.  Some word finding difficulties.  She usually does not have hemiparesis, no known L leg weakness.  She did seem unable to lift her R arm.    ER Course:  Acute CVA on MRI.  Presented with AMS, speech difficulties.  Neurology will consult.  Deficits are improving, ongoing word finding difficulties.       Review of Systems: unable to review all systems due to the inability of the patient to answer questions. Past Medical History:  Diagnosis Date   Breast cancer [facility])    CVA (cerebral vascular accident) ([place])    Dementia ([place])    Hypertension    Seizure ([place]) 07/2020   History reviewed. No pertinent surgical history. Social History:  reports that she has quit smoking. Her smoking use included cigarettes. She has a 50.00 pack-year smoking history. She has never used smokeless tobacco. She reports that she does not drink alcohol and does not use drugs.  Allergies  Allergen Reactions   Diphth-Acell Pertussis-Tetanus     Other reaction(s): Other (See Comments)    Dtap-Hepatitis B Recomb-Ipv Other (See Comments)    unknown   Norco [Hydrocodone-Acetaminophen] Nausea And Vomiting   Percocet [Oxycodone-Acetaminophen] Nausea And Vomiting    History reviewed. No pertinent family history.  Prior to Admission medications   Medication Sig Start Date End Date Taking? Authorizing Provider  acetaminophen (TYLENOL) 500 MG tablet Take 500 mg by mouth every 6 (six) hours as needed for moderate pain.    [provider]  amLODipine (NORVASC) 5 MG tablet Take 1 tablet (5 mg total) by mouth daily. 08/28/21   Shelly Coss, MD  atorvastatin (LIPITOR) 80 MG tablet Take 1 tablet (80 mg total) by mouth daily. 08/29/21   Shelly Coss, MD  carboxymethylcellulose (REFRESH PLUS) 0.5 % SOLN Place 1 drop into both eyes 3 (three) times daily as needed (dry eyes).    [provider]  dicyclomine (BENTYL) 10 MG capsule Take 10 mg by mouth daily as needed for spasms. 07/24/20   [provider]  ferrous sulfate 325 (65 FE) MG tablet Take 1 tablet (325 mg total) by mouth daily. 10/30/13   Delora Fuel, MD  letrozole [facility]) 2.5 MG tablet Take 2.5 mg by mouth daily. 12/12/20   [provider]  loperamide (IMODIUM A-D) 2 MG tablet Take 2 mg by mouth 4 (four) times daily as needed for diarrhea or loose stools.    [provider]  magnesium oxide (MAG-OX) 400 (240 Mg) MG tablet Take 0.5 tablets (200 mg total)  by mouth 2 (two) times daily. 12/22/20   Regalado, Belkys A, MD  Multiple Vitamin (MULTIVITAMIN WITH MINERALS) TABS tablet Take 1 tablet by mouth daily. Centrum    [provider]  valsartan (DIOVAN) 160 MG tablet Take 160 mg by mouth daily. 12/12/20   [provider]  vitamin B-12 100 MCG tablet Take 1 tablet (100 mcg total) by mouth daily. 12/23/20   Elmarie Shiley, MD    Physical Exam: Vitals:   02/03/22 1130 02/03/22 1145 02/03/22 1200 02/03/22 1215  BP: (!) 199/67 (!) 179/69 (!) 199/64 (!) 184/69  Pulse: (!) 51  (!) 49 (!) 47 (!) 53  Resp: 15 20 (!) 21 11  Temp:      TempSrc:      SpO2: 92% 100% 100% 100%   General:  Appears calm and comfortable and is in NAD Eyes:  PERRL, EOMI, normal lids, iris ENT:  grossly normal hearing, lips & tongue, mmm Neck:  no LAD, masses or thyromegaly Cardiovascular:  RRR, no m/r/g. No LE edema.  Respiratory:   CTA bilaterally with no wheezes/rales/rhonchi.  Normal respiratory effort. Abdomen:  soft, NT, ND Skin:  no rash or induration seen on limited exam Musculoskeletal:  some RUE weakness and LLE weakness appear evident on exam but this may be related to her dementia, no bony abnormality Psychiatric:  blunted mood and affect, speech fluent and appropriate, AOx2 Neurologic:  CN 2-12 grossly intact, moves all extremities in coordinated fashion other than as above   Radiological Exams on Admission: Independently reviewed - see discussion in A/P where applicable  MR BRAIN WO CONTRAST  Result Date: 02/03/2022 CLINICAL DATA:  TIA.  Confusion EXAM: MRI HEAD WITHOUT CONTRAST TECHNIQUE: Multiplanar, multiecho pulse sequences of the brain and surrounding structures were obtained without intravenous contrast. COMPARISON:  08/26/2021 FINDINGS: Moderate area of cortical restricted diffusion at the left parietal cortex. Chronic infarcts seen in the bilateral cerebellum, posterior division right MCA territory, bilateral basal ganglia, and parasagittal left frontal lobe. No hydrocephalus or shift. IMPRESSION: 1. Acute left parietal cortex infarct involving a moderate area. 2. Multiple remote infarcts. 3. Truncated study, only motion degraded diffusion images were acquired. Electronically Signed   By: Jorje Guild M.D.   On: 02/03/2022 08:11   DG Abdomen 1 View  Result Date: 02/03/2022 CLINICAL DATA:  Clearance for MRI EXAM: ABDOMEN - 1 VIEW COMPARISON:  None similar FINDINGS: Artifact from EKG leads. Cholecystectomy clips. Normal bowel gas pattern. Punctate high-density areas  are attributed to fecal contents. Lumbar spine degeneration and scoliosis. IMPRESSION: No acute finding.  No contraindication to MRI. Electronically Signed   By: Jorje Guild M.D.   On: 02/03/2022 05:57   DG Chest Portable 1 View  Result Date: 02/03/2022 CLINICAL DATA:  Clearance for MRI EXAM: PORTABLE CHEST 1 VIEW COMPARISON:  08/06/2021 FINDINGS: Chest wall clips for lumpectomy and right axillary dissection. Normal heart size and mediastinal contours. There is no edema, consolidation, effusion, or pneumothorax. Artifact from EKG leads. IMPRESSION: No evidence of active disease. No contraindication to MRI at the chest Electronically Signed   By: Jorje Guild M.D.   On: 02/03/2022 05:56   CT Head Wo Contrast  Result Date: 02/02/2022 CLINICAL DATA:  Altered mental status. EXAM: CT HEAD WITHOUT CONTRAST TECHNIQUE: Contiguous axial images were obtained from the base of the skull through the vertex without intravenous contrast. RADIATION DOSE REDUCTION: This exam was performed according to the departmental dose-optimization program which includes automated exposure control, adjustment of the mA  and/or kV according to patient size and/or use of iterative reconstruction technique. COMPARISON:  July 07, 2021 FINDINGS: Brain: There is mild cerebral atrophy with widening of the extra-axial spaces and ventricular dilatation. There are areas of decreased attenuation within the white matter tracts of the supratentorial brain, consistent with microvascular disease changes. Chronic left frontal lobe, right temporal lobe and right parieto-occipital infarcts are noted. Chronic bilateral basal ganglia lacunar infarcts are also seen. Vascular: There is marked severity calcification of the bilateral cavernous carotid arteries. Skull: Normal. Negative for fracture or focal lesion. Sinuses/Orbits: No acute finding. Other: None. IMPRESSION: 1. Generalized cerebral atrophy and chronic white matter small vessel ischemic  changes without evidence of an acute intracranial abnormality. 2. Chronic left frontal lobe, right temporal lobe and right parieto-occipital infarcts. Electronically Signed   By: Virgina Norfolk M.D.   On: 02/02/2022 22:28    EKG: Independently reviewed.  Sinus arrhythmia with rate 56; no evidence of acute ischemia   Labs on Admission: I have personally reviewed the available labs and imaging studies at the time of the admission.  Pertinent labs:    Unremarkable CMP HS troponin 14 WNC 11.6   Assessment and Plan: Principal Problem:   Acute CVA (cerebrovascular accident) ([place]) Active Problems:   Dementia without behavioral disturbance ([place])   Hypertension   Malignant neoplasm of upper-outer quadrant of right breast in female, estrogen receptor positive ([place])   Dyslipidemia    CVA -Patient presenting with stroke-like symptoms; she has baseline dementia but this was different from her usual baseline -Concerning for TIA/CVA -MRI is positive for acute L parietal cortex infarct -Aspirin has been given to reduce stroke mortality and decrease morbidity -Will admit for further CVA evaluation -Telemetry monitoring -MRA -Echo -Patient will need DAPT for 21 days when ABCD2 score is at least 4 and NIH score is 3 or less, and then can transition to monotherapy with a single antiplatelet agent. Will defer to neurology for now. -Neurology consult -PT/OT/ST/Nutrition Consults  HTN -Allow permissive HTN for now -Treat BP only if >220/120, and then with goal of 15% reduction -Hold amlodipine, valsartan and plan to restart in 48-72 hours   HLD -Check FLP -Continue Lipitor 80 mg daily   H/o ? seizure -She was here in 07/2021 for possible seizure activity -She is not on AED at this time  Breast cancer -There is not clear data about the association of aromatase inhibitors and strokes -Will hold letrozole for now  Dementia -Will order delirium precautions     Advance Care Planning:    Code Status: Full Code   Consults: Neurology; PT/OT/ST; nutrition; TOC team  DVT Prophylaxis: Lovenox  Family Communication: None present; I spoke with her daughter by telephone at the time of admission  Severity of Illness: The appropriate patient status for this patient is INPATIENT. Inpatient status is judged to be reasonable and necessary in order to provide the required intensity of service to ensure the patient's safety. The patient's presenting symptoms, physical exam findings, and initial radiographic and laboratory data in the context of their chronic comorbidities is felt to place them at high risk for further clinical deterioration. Furthermore, it is not anticipated that the patient will be medically stable for discharge from the hospital within 2 midnights of admission.   * I certify that at the point of admission it is my clinical judgment that the patient will require inpatient hospital care spanning beyond 2 midnights from the point of admission due to high intensity of service,  high risk for further deterioration and high frequency of surveillance required.*  Author: Karmen Bongo, MD 02/03/2022 12:57 PM  For on call review [email or web address].

## 2022-02-03 NOTE — Progress Notes (Signed)
Patient arrived to unit. Alert to self only. Very confused trying to climb out of bed. MD notified.

## 2022-02-03 NOTE — ED Provider Notes (Signed)
  Physical Exam  BP (!) 182/63   Pulse (!) 51   Temp 98 F (36.7 C) (Oral)   Resp 12   SpO2 100%   Physical Exam  Procedures  Procedures  ED Course / MDM   Clinical Course as of 02/03/22 0719  Sat Feb 02, 2022  2246 [phone] stephanie daughter [MR]  2337 I again spoke with the patient daughter about work-up thus far.  She understands we are waiting for urinalysis but head CT looks stable from prior.  She reports that she is in route to the hospital. [MR]  Sun Feb 03, 2022  0133 CXR and KUB ordered for MR clearance per radiology protocol in patient with advanced dementia.  [RS]    Clinical Course User Index [MR] Redwine, Madison A, PA-C [RS] Tommy Minichiello, Gypsy Balsam, PA-C   Medical Decision Making Amount and/or Complexity of Data Reviewed Labs: ordered. Radiology: ordered.  Risk Prescription drug management.    Care of this patient assumed from preceding ED provider [place], PA-C at time of shift change.  Please see her associated note for further insight and the patient ED course.  In brief patient is a 76 year old female with advanced dementia who had a transient period of altered mental status and slurred speech this morning, resolved prior to EMS arrival.  At this time patient is nonfocal on her neurologic exam and is at her mental status baseline. Laboratory studies reassuring as well as CT head.  At time shift change patient pending UA.  If suspicious for UTI, will treat and discharge home.  However if urine without evidence of infection patient will require MRI of the brain per neurology recommendation.  UA without evidence of infection, MRI brain ordered.  Delay in MRI due to patient's dementia and requirement for chest and belly x-rays to clear for MRI.  These were normal.  At time of shift change, care of the patient handed oncoming ED provider Paulita Cradle, PA-C pending MRI brain.  Patient is hemodynamically stable and remains at her mental status  baseline.  Resting comfortably at this time.  This chart was dictated using voice recognition software, Dragon. Despite the best efforts of this provider to proofread and correct errors, errors may still occur which can change documentation meaning.      Emeline Darling, PA-C 02/03/22 8676    Orpah Greek, MD 02/04/22 [phone]

## 2022-02-03 NOTE — ED Notes (Signed)
Pt climbing out of bed, confused. RN gave pt ativan

## 2022-02-03 NOTE — ED Notes (Signed)
EDP Aware of VS

## 2022-02-03 NOTE — ED Notes (Signed)
Pt in afib RVR. Doctor paged. Dr put in order for '2mg'$  of dilt, pt is now at 75 in afib. MD notified.

## 2022-02-04 ENCOUNTER — Inpatient Hospital Stay (HOSPITAL_COMMUNITY): Payer: Medicare PPO

## 2022-02-04 DIAGNOSIS — I6389 Other cerebral infarction: Secondary | ICD-10-CM | POA: Diagnosis not present

## 2022-02-04 DIAGNOSIS — I639 Cerebral infarction, unspecified: Secondary | ICD-10-CM | POA: Diagnosis not present

## 2022-02-04 DIAGNOSIS — E43 Unspecified severe protein-calorie malnutrition: Secondary | ICD-10-CM | POA: Insufficient documentation

## 2022-02-04 DIAGNOSIS — R4182 Altered mental status, unspecified: Secondary | ICD-10-CM

## 2022-02-04 LAB — LIPID PANEL
Cholesterol: 159 mg/dL (ref 0–200)
HDL: 51 mg/dL (ref >40)
LDL Cholesterol: 98 mg/dL (ref 0–99)
Total CHOL/HDL Ratio: 3.1 RATIO
Triglycerides: 49 mg/dL (ref <150)
VLDL: 10 mg/dL (ref 0–40)

## 2022-02-04 LAB — ECHOCARDIOGRAM COMPLETE
AR max vel: 1.53 cm2
AV Area VTI: 1.51 cm2
AV Area mean vel: 1.4 cm2
AV Mean grad: 2 mmHg
AV Peak grad: 3.9 mmHg
Ao pk vel: 0.99 m/s
Area-P 1/2: 3.21 cm2
Calc EF: 62.5 %
S' Lateral: 3.1 cm
Single Plane A2C EF: 63.8 %
Single Plane A4C EF: 63.2 %
Weight: 1689.61 oz

## 2022-02-04 MED ORDER — ENSURE ENLIVE PO LIQD
237.0000 mL | Freq: Three times a day (TID) | ORAL | Status: DC
  Administered 2022-02-04 – 2022-02-05 (×3): 237 mL via ORAL

## 2022-02-04 MED ORDER — EZETIMIBE 10 MG PO TABS
10.0000 mg | ORAL_TABLET | Freq: Every day | ORAL | Status: DC
  Administered 2022-02-04 – 2022-02-05 (×2): 10 mg via ORAL
  Filled 2022-02-04 (×2): qty 1

## 2022-02-04 NOTE — TOC Initial Note (Signed)
Transition of Care [facility]) - Initial/Assessment Note    Patient Details  Name: Olivia Ross MRN: [id number] Date of Birth: [birth year]  [facility] CM/SW Contact:    Pollie Friar, RN Phone Number: 02/04/2022, 3:51 PM  Clinical Narrative:                 CM met with the patient but she is only oriented to self. CM received permission to talk with her daughter. CM has left voicemail for daughter.  TOC following.  Expected Discharge Plan: [place] Barriers to Discharge: Continued Medical Work up   Patient Goals and CMS Choice   CMS Medicare.gov Compare Post Acute Care list provided to:: Patient Represenative (must comment) Choice offered to / list presented to : Adult Children  Expected Discharge Plan and Services Expected Discharge Plan: [place]   Discharge Planning Services: CM Consult Post Acute Care Choice: [place] arrangements for the past 2 months: Apartment                           HH Arranged: PT, OT          Prior Living Arrangements/Services Living arrangements for the past 2 months: Apartment Lives with:: Adult Children Patient language and need for interpreter reviewed:: Yes Do you feel safe going back to the place where you live?: Yes            Criminal Activity/Legal Involvement Pertinent to Current Situation/Hospitalization: No - Comment as needed  Activities of Daily Living      Permission Sought/Granted                  Emotional Assessment Appearance:: Appears stated age     Orientation: : Oriented to Self   Psych Involvement: No (comment)  Admission diagnosis:  Transient alteration of awareness [R40.4] Acute CVA (cerebrovascular accident) [facility]) [I63.9] Patient Active Problem List   Diagnosis Date Noted   Protein-calorie malnutrition, severe 02/04/2022   Acute CVA (cerebrovascular accident) ([place]) 02/03/2022   Dyslipidemia 02/03/2022   Transient alteration of  awareness 08/26/2021   Sinus bradycardia 08/26/2021   Hypertension    History of CVA (cerebrovascular accident) 02/02/2021   Generalized weakness    Malignant neoplasm of upper-outer quadrant of right breast in female, estrogen receptor positive ([place]) 08/05/2019   Dementia without behavioral disturbance ([place]) 07/07/2019   PCP:  Jolinda Croak, MD Pharmacy:   [facility] [phone] [address], [address] AT [facility] [address] [place] [address] Phone: [phone] Fax: [phone]     Social Determinants of Health (SDOH) Interventions    Readmission Risk Interventions     No data to display

## 2022-02-04 NOTE — Evaluation (Signed)
Occupational Therapy Evaluation Patient Details Name: Olivia Ross MRN: [id number] DOB: 07-08-1945 Today's Date: 02/04/2022   History of Present Illness Olivia Ross is a 76 y.o. female presenting with AMS. 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 PHMx:  significant of breast cancer, vascular dementia, seizure, CVA (2022), and HTN   Clinical Impression   [name] admitted with above presents to acute OT with PLOF unknown due to patient unable to tell us and no family present nor could they be reached by phone currently. Pt is presently total A for all basic ADLs and Mod-Max A+2 for a mobility. She will continue to benefit from acute OT with follow up [place] as long as family can provide 24 hour s/prn A at her current level; if not then will need SNF.     Recommendations for follow up therapy are one component of a multi-disciplinary discharge planning process, led by the attending physician.  Recommendations may be updated based on patient status, additional functional criteria and insurance authorization.   Follow Up Recommendations  Home health OT    Assistance Recommended at Discharge Frequent or constant Supervision/Assistance  Patient can return home with the following A lot of help with bathing/dressing/bathroom;Two people to help with walking and/or transfers;Assistance with cooking/housework;Assistance with feeding;Help with stairs or ramp for entrance;Assist for transportation;Direct supervision/assist for financial management;Direct supervision/assist for medications management    Functional Status Assessment  Patient has had a recent decline in their functional status and demonstrates the ability to make significant improvements in function in a reasonable and predictable amount of time.  Equipment Recommendations  None recommended by OT       Precautions / Restrictions Precautions Precautions: Fall Restrictions Weight Bearing Restrictions: No       Mobility Bed Mobility Overal bed mobility: Needs Assistance Bed Mobility: Supine to Sit     Supine to sit: Max assist, +2 for physical assistance, +2 for safety/equipment, HOB elevated     General bed mobility comments: Max +2 to initiate feet off EOB and pivot hips with bed pad. Assist to raise trunk but pt able to maintain seated balance once upright and at EOB.    Transfers Overall transfer level: Needs assistance Equipment used: 2 person hand held assist, Rolling walker (2 wheels) Transfers: Sit to/from Stand, Bed to chair/wheelchair/BSC Sit to Stand: Mod assist, +2 physical assistance, +2 safety/equipment           General transfer comment: Mod +2 for power up from EOB, HHA, pt able to initiate with bil LE's against bed and slight posterior lean at start of rise. Mod +2 to guide steps/weight shift to move bed>chair. sit<>stand from recliner for pericare to change pad mesh panty. Min assist to steady balance with RW for support in standing.      Balance Overall balance assessment: Needs assistance Sitting-balance support: Feet supported, Bilateral upper extremity supported Sitting balance-Leahy Scale: Fair     Standing balance support: Reliant on assistive device for balance, Bilateral upper extremity supported Standing balance-Leahy Scale: Poor Standing balance comment: reliant on RW and/or therapist for external support                           ADL either performed or assessed with clinical judgement   ADL Overall ADL's : Needs assistance/impaired Eating/Feeding: Total assistance   Grooming: Total assistance   Upper Body Bathing: Total assistance   Lower Body Bathing: Total assistance   Upper Body  Dressing : Total assistance   Lower Body Dressing: Total assistance   Toilet Transfer: Total assistance   Toileting- Clothing Manipulation and Hygiene: Total assistance         General ADL Comments: Sitting balance min guard A, sit<>stand and  stand pivot Mod A +2     Vision   Additional Comments: unsure, pt cannot tell us about her vision and cannot follow direction for visual testing            Pertinent Vitals/Pain Pain Assessment Pain Assessment: PAINAD Breathing: normal Negative Vocalization: none Facial Expression: smiling or inexpressive Body Language: relaxed Consolability: no need to console PAINAD Score: 0     Hand Dominance Right   Extremity/Trunk Assessment Upper Extremity Assessment Upper Extremity Assessment: Generalized weakness   Lower Extremity Assessment Lower Extremity Assessment: RLE deficits/detail;LLE deficits/detail;Generalized weakness RLE Deficits / Details: light touch absent below knee bil LE, pt with delayed response to noxious stimuli on Rt LE compared to Lt LE. pt unable to follow commands for MMT and grassly week throughout of 3/5 or less. RLE Sensation: decreased light touch RLE Coordination: decreased gross motor;decreased fine motor LLE Deficits / Details: light touch absent below knee bil LE, pt with delayed response to noxious stimuli on Rt LE compared to Lt LE. pt unable to follow commands for MMT and grassly week throughout of 3/5 or less. LLE Coordination: decreased gross motor;decreased fine motor   Cervical / Trunk Assessment Cervical / Trunk Assessment: Normal   Communication Communication Communication: No difficulties   Cognition Arousal/Alertness: Awake/alert Behavior During Therapy: Flat affect Overall Cognitive Status: History of cognitive impairments - at baseline                                 General Comments: h/o vascular dementia. followed minimal one step commands, not oriented, said she lives with her mother (it his her dtr)                Home Living Family/patient expects to be discharged to:: Private residence Living Arrangements: Children Available Help at Discharge: Family;Available PRN/intermittently Type of Home:  Apartment Home Access: Stairs to enter Entrance Stairs-Number of Steps: flight Entrance Stairs-Rails: Right Home Layout: One level     Bathroom Shower/Tub: Occupational psychologist: Standard     Home Equipment: Conservation officer, nature (2 wheels);Shower seat   Additional Comments: pt unable to provide PLOF or home set up secondary to dementia. attempted to call pt's daughter, no answer, will attempt additional follow up as able. home set up and PLOF per chart review from prior admissions in 2022 & 2023.      Prior Functioning/Environment Prior Level of Function : Needs assist             Mobility Comments: uses RW with assist of dtr per RN ADLs Comments: pt reports her dtr helps her with ADLs        OT Problem List: Decreased strength;Impaired balance (sitting and/or standing);Decreased cognition;Decreased safety awareness      OT Treatment/Interventions: Self-care/ADL training;DME and/or AE instruction;Balance training;Patient/family education;Therapeutic activities    OT Goals(Current goals can be found in the care plan section) Acute Rehab OT Goals Patient Stated Goal: unable to state OT Goal Formulation: Patient unable to participate in goal setting Time For Goal Achievement: 02/18/22 Potential to Achieve Goals: Fair  OT Frequency: Min 2X/week    Co-evaluation PT/OT/SLP Co-Evaluation/Treatment: Yes Reason for Co-Treatment: For  patient/therapist safety;To address functional/ADL transfers;Necessary to address cognition/behavior during functional activity          AM-PAC OT "6 Clicks" Daily Activity     Outcome Measure Help from another person eating meals?: Total Help from another person taking care of personal grooming?: Total Help from another person toileting, which includes using toliet, bedpan, or urinal?: Total Help from another person bathing (including washing, rinsing, drying)?: Total Help from another person to put on and taking off regular upper body  clothing?: Total Help from another person to put on and taking off regular lower body clothing?: Total 6 Click Score: 6   End of Session Equipment Utilized During Treatment: Gait belt;Rolling walker (2 wheels)  Activity Tolerance: Patient tolerated treatment well Patient left: in chair;with call bell/phone within reach;with chair alarm set  OT Visit Diagnosis: Unsteadiness on feet (R26.81);Other abnormalities of gait and mobility (R26.89);Muscle weakness (generalized) (M62.81);Cognitive communication deficit (R41.841) Symptoms and signs involving cognitive functions:  (dementia)                Time: 1010-1029 OT Time Calculation (min): 19 min Charges:  OT General Charges $OT Visit: 1 Visit OT Evaluation $OT Eval Moderate Complexity: [place], OTR/L Acute [employer] [facility] [phone] Office [phone]    Almon Register 02/04/2022, 10:59 AM

## 2022-02-04 NOTE — Progress Notes (Addendum)
STROKE TEAM PROGRESS NOTE   INTERVAL HISTORY No family is at the bedside.  Patient is sitting up at bedside recliner.  EKG and telemetry without evidence of atrial fibrillation since admission. Question whether patient truly had evidence of atrial fibrillation. No documented history of atrial fibrillation.  Patient's presentation of confusion and LLE weakness does not correlate well with imaging findings of acute left parietal cortex infarction.  Patient is oriented to self and place but does not answer further orientation questions. She requires multiple repeat instruction to intermittently follow simple commands. Appears confused. Likely baseline with her history of dementia. Poor attention and concentration.  MRI scan of the brain shows tiny left parietal cortical punctate infarct with old large right MCA territory infarct with encephalomalacia and gliosis.  Carotid ultrasound shows no extracranial stenosis.  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  EEG shows mild diffuse encephalopathy without epileptiform activity.  LDL cholesterol is 98 mg percent. Vitals:   02/03/22 1952 02/03/22 2334 02/04/22 0350 02/04/22 0720  BP: (!) 172/74 (!) 170/69 (!) 172/60 (!) 176/71  Pulse: 62 (!) 53 (!) 49 (!) 52  Resp: '16 16 16 16  '$ Temp: 97.8 F (36.6 C) 97.6 F (36.4 C) (!) 97.5 F (36.4 C) 97.6 F (36.4 C)  TempSrc: Oral Oral Oral Oral  SpO2: 100% 100% 100% 100%   CBC:  Recent Labs  Lab 02/02/22 2120 02/02/22 2145  WBC 11.6*  --   NEUTROABS 8.2*  --   HGB 12.3 12.9  HCT 37.4 38.0  MCV 97.1  --   PLT 143*  --    Basic Metabolic Panel:  Recent Labs  Lab 02/02/22 2120 02/02/22 2145  NA 141 143  K 3.8 4.1  CL 110 108  CO2 23  --   GLUCOSE 89 85  BUN 16 18  CREATININE 0.71 0.70  CALCIUM 9.7  --    Lipid Panel:  Recent Labs  Lab 02/04/22 0320  CHOL 159  TRIG 49  HDL 51  CHOLHDL 3.1  VLDL 10  LDLCALC 98   HgbA1c:  Recent Labs  Lab 02/02/22 2136   HGBA1C 4.7*   Urine Drug Screen: No results for input(s): "LABOPIA", "COCAINSCRNUR", "LABBENZ", "AMPHETMU", "THCU", "LABBARB" in the last 168 hours.  Alcohol Level  Recent Labs  Lab 02/02/22 2136  ETH <10   IMAGING past 24 hours MR ANGIO HEAD WO CONTRAST  Result Date: 02/03/2022 CLINICAL DATA:  Follow-up examination for stroke. EXAM: MRA HEAD WITHOUT CONTRAST TECHNIQUE: Angiographic images of the Circle of Willis were acquired using MRA technique without intravenous contrast. COMPARISON:  Prior MRI from earlier the same day. FINDINGS: Anterior circulation: Examination degraded by motion. Visualized distal cervical segments of the internal carotid arteries are patent with antegrade flow. Petrous segments patent bilaterally. Mild atheromatous change within the carotid siphons without significant stenosis. A1 segments patent bilaterally. Normal anterior communicating complex. Anterior cerebral arteries patent without visible stenosis. Focal severe stenosis present at the proximal right M1 segment (series 5, image 100). Scant attenuated flow seen distally within the right MCA distribution. Findings in keeping with the large remote right MCA territory infarct. Left M1 segment without high-grade stenosis. Suspected severe proximal left M2 stenosis (series 1036, image 11). Atheromatous irregularity throughout the MCA branches distally which remain patent. Posterior circulation: Vertebral arteries are largely codominant and patent without stenosis. Both PICA patent at their origins. Basilar patent without visible stenosis. Superior cerebral arteries patent bilaterally. Both PCAs primarily supplied via the  basilar. Focal 2 mm outpouching extending anteriorly from the left P1/P2 junction favored to reflect a small left PCOM remnant (series 5, image 98). PCAs are patent to their distal aspects without proximal high-grade stenosis. Anatomic variants: None significant. Other: None. IMPRESSION: 1. Motion degraded  exam. 2. Negative for acute large vessel occlusion. 3. Focal severe proximal right M1 stenosis, with scant attenuated flow seen distally within the right MCA distribution. Finding is in keeping with the large remote right MCA territory infarct. 4. Suspected severe proximal left M2 stenosis. 5. 2 mm outpouching extending anteriorly from the left P1/P2 junction, favored to reflect a small left PCOM remnant. Electronically Signed   By: Jeannine Boga M.D.   On: 02/03/2022 20:58   CT HEAD WO CONTRAST (5MM)  Result Date: 02/03/2022 CLINICAL DATA:  Follow-up examination for stroke. EXAM: CT HEAD WITHOUT CONTRAST TECHNIQUE: Contiguous axial images were obtained from the base of the skull through the vertex without intravenous contrast. RADIATION DOSE REDUCTION: This exam was performed according to the departmental dose-optimization program which includes automated exposure control, adjustment of the mA and/or kV according to patient size and/or use of iterative reconstruction technique. COMPARISON:  Prior brain MRI from earlier the same day. FINDINGS: Brain: Small area of evolving cytotoxic edema seen at the posterior left frontal region, consistent with an evolving acute left MCA distribution infarct, and relatively stable from prior MRI. No associated hemorrhage or mass effect. No other acute intracranial hemorrhage or visible large vessel territory infarct. No mass lesion or midline shift. No hydrocephalus or extra-axial fluid collection. Atrophy with chronic small vessel ischemic disease with multiple remote lacunar infarcts about the bilateral basal ganglia. Scattered chronic bilateral cerebellar infarcts. Remote right MCA and left ACA territory infarcts noted. Vascular: No abnormal hyperdense vessel. Scattered vascular calcifications noted within the carotid siphons. Skull: Scalp soft tissues and calvarium demonstrate no new finding. Sinuses/Orbits: Left gaze noted. Globes orbital soft tissues demonstrate  no other acute finding. Paranasal sinuses are clear. No mastoid effusion. Other: None. IMPRESSION: 1. Small area of evolving cytotoxic edema at the posterior left frontal region, consistent with an evolving acute left MCA distribution infarct, stable from prior MRI. No associated hemorrhage or mass effect. 2. No other new acute intracranial abnormality. 3. Atrophy with extensive chronic ischemic changes, stable. Electronically Signed   By: Jeannine Boga M.D.   On: 02/03/2022 20:31   VAS US CAROTID (at [facility] and [facility] only)  Result Date: 02/03/2022 Carotid Arterial Duplex Study Patient Name:  Olivia Ross  Date of Exam:   02/03/2022 Medical Rec #: [id number]       Accession #:    [id number] Date of Birth: 11/15/1945       Patient Gender: F Patient Age:   50 years Exam Location:  [facility] Procedure:      VAS US CAROTID Referring Phys: Janine Ores --------------------------------------------------------------------------------  Indications:       CVA. Risk Factors:      Hypertension, prior CVA. Limitations        Today's exam was limited due to talking & patient movement. Comparison Study:  no prior Performing Technologist: Archie Patten RVS  Examination Guidelines: A complete evaluation includes B-mode imaging, spectral Doppler, color Doppler, and power Doppler as needed of all accessible portions of each vessel. Bilateral testing is considered an integral part of a complete examination. Limited examinations for reoccurring indications may be performed as noted.  Right Carotid Findings: +----------+--------+--------+--------+------------------+--------+  PSV cm/sEDV cm/sStenosisPlaque DescriptionComments +----------+--------+--------+--------+------------------+--------+ CCA Prox  51      6               heterogenous               +----------+--------+--------+--------+------------------+--------+ CCA Distal65      5               heterogenous                +----------+--------+--------+--------+------------------+--------+ ICA Prox  58      10              heterogenous               +----------+--------+--------+--------+------------------+--------+ ICA Distal52      9                                          +----------+--------+--------+--------+------------------+--------+ ECA       141                                                +----------+--------+--------+--------+------------------+--------+ +----------+--------+-------+--------+-------------------+           PSV cm/sEDV cmsDescribeArm Pressure (mmHG) +----------+--------+-------+--------+-------------------+ [id number]                                        +----------+--------+-------+--------+-------------------+ +---------+--------+--+--------+-+ VertebralPSV cm/s38EDV cm/s8 +---------+--------+--+--------+-+  Left Carotid Findings: +----------+--------+--------+--------+------------------+--------+           PSV cm/sEDV cm/sStenosisPlaque DescriptionComments +----------+--------+--------+--------+------------------+--------+ CCA Prox  59      9               heterogenous               +----------+--------+--------+--------+------------------+--------+ CCA Distal54      7               heterogenous               +----------+--------+--------+--------+------------------+--------+ ICA Prox  34      8               heterogenous               +----------+--------+--------+--------+------------------+--------+ ICA Distal24      5                                          +----------+--------+--------+--------+------------------+--------+ ECA       69                                                 +----------+--------+--------+--------+------------------+--------+ +----------+--------+--------+--------+-------------------+           PSV cm/sEDV cm/sDescribeArm Pressure (mmHG)  +----------+--------+--------+--------+-------------------+ [id number]                                          +----------+--------+--------+--------+-------------------+ +---------+--------+--+--------+-+---------+ VertebralPSV  cm/s57EDV cm/s9Antegrade +---------+--------+--+--------+-+---------+   Summary: Right Carotid: Velocities in the right ICA are consistent with a 1-39% stenosis. Left Carotid: Velocities in the left ICA are consistent with a 1-39% stenosis. Vertebrals: Bilateral vertebral arteries demonstrate antegrade flow. *See table(s) above for measurements and observations.  Electronically signed by Antony Contras MD on 02/03/2022 at 5:07:17 PM.    Final     PHYSICAL EXAM  Physical Exam  Constitutional: Appears thin, in no acute distress.   Psych: Patient is confused but cooperates intermittently with exam  Eyes: No scleral injection HENT: No OP obstrucion MSK: no joint deformities or swelling noted Cardiovascular: Normal rate and regular rhythm on telemetry Respiratory: Effort normal, non-labored breathing on room air  GI: Soft.  No distension. There is no tenderness.  Skin: WDI  Neuro: Mental Status: Patient is awake, alert, oriented to self and place. She is unable to correctly state her age, she incorrectly states that she is 76 years old. She does not correctly state the month and is unable to provide a clear or coherent history of present illness.  She intermittently follows simple commands and often needs multiple repeat instruction. At times, she does not follow commands at all. Poor attention and concentration.  Cranial Nerves: II: Pupils are equal, round, and reactive to light.  III,IV, VI: She tracks throughout her visual fields. V: Facial sensation is symmetric to light touch  VII: Facial movement is symmetric resting and grimacing VIII: Hearing is intact to loud voice X: No hypophonia noted  XI: Shoulder shrug is symmetric. XII: Tongue protrudes  midline  Motor: Tone is normal. Spontaneous movement throughout. She is able to elevate bilateral lower extremities antigravity but loses attention and drops them. She does not elevate bilateral lower extremities to command but will briskly withdraw to noxious stimuli and state "ouch"  Sensory: As above.  Cerebellar: Does not perform   ASSESSMENT/PLAN Olivia Ross is a 76 y.o. 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 Initial evaluation was concerning for left lower extremity weakness and confusion. Patient was not given TNK as she presented outside of the thrombolytic therapy time window. Initial neurologist noted the patient to be in atrial fibrillation on arrival though there are no telemetry strips or EKGs supporting atrial fibrillation for review.   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 CTH: 1. Small area of evolving cytotoxic edema at the posterior left frontal region, consistent with an evolving acute left MCA distribution infarct, stable from prior MRI. No associated hemorrhage or mass effect. 2. No other new acute intracranial abnormality. 3. Atrophy with extensive chronic ischemic changes, stable. MRA motion degraded, negative for acute LVO, focal severe proximal right M1 stenosis, with scant attenuated flow seen distally within the right MCA distribution. Finding is in keeping with the large remote right MCA territory infarction. Suspected severe proximal left M2 stenosis. 2 mm outpouching extending anteriorly from the left P1/P2 junction, favored to reflext a small left PCOM  remnant.  Carotid Doppler  1-39% stenosis within the left and right ICA 2D Echo pending  LDL 98 HgbA1c 4.7 VTE prophylaxis - Lovenox SQ    Diet  Diet Heart Room service appropriate? Yes; Fluid consistency: Thin   No antithrombotic prior to admission, now on aspirin 81 mg daily and clopidogrel 75 mg daily. DAPT for 21 days with ASA and clopidogrel followed by ASA 81 mg monotherapy unless atrial fibrillation is confirmed.  Therapy recommendations:  Home health OT, pending PT evaluation  Disposition:  pending   History of right MCA CVA 2022 Imaging reveals chronic, multiple remote CVAs in April 2022 No neurology records for review regarding history of stroke evaluation or treatment   Previous syncopal episode in May of 2023, possibly concerning for seizure (unwitnessed onset) EEG 08/27/2021 without seizures or epileptiform discharges. EEG with cortical dysfunction from left temporal and right posterior quadrant, likely secondary to underlying strokes.   Hypertension Home meds:  Amlodipine Poorly controlled Permissive hypertension (OK if < 220/120) but gradually normalize in 5-7 days Long-term BP goal normotensive  Hyperlipidemia Home meds:  atorvastatin, resumed in hospital LDL 98, goal < 70 Add Zetia 10 mg daily Continue high intensity statin at discharge  Other Stroke Risk Factors Advanced Age >/= 76  Hx stroke/TIA Family hx stroke (sister)  Other Active Problems Vascular dementia  Breast cancer s/p mastectomy 2021 and hormone therapy   Hospital day # 1  Anibal Henderson, AGACNP-BC [facility] Pager: [phone]  STROKE MD NOTE :  I have personally obtained history,examined this patient, reviewed notes, independently viewed imaging studies, participated in medical decision making and plan of care.ROS completed by me personally and pertinent positives fully documented  I have made any additions or clarifications directly to the above note. Agree with note  above.  Patient presented with confusion and left leg weakness and MRI shows a small punctate left parietal infarct which cannot explain her weakness which is likely of embolic etiology.  She has history of old large right MCA infarct per review of records even in care everywhere fails to show exact etiology but MRI does show chronic right M1 occlusion.  Neurological exam reveals confusion and disorientation which may be baseline, dementia but otherwise no focal weakness.  Patient may need prolonged cardiac monitoring at discharge to look for paroxysmal A-fib.  Recommend aspirin and Plavix for 3 weeks followed by aspirin alone and aggressive risk factor modification.  Physical occupational and speech therapy consults.  No family available at the bedside for discussion.  Discussed with Dr. Avon Gully.  Greater than 50% time during this 50-minute visit was spent in counseling and coordination of care about her embolic stroke and discussion about evaluation and treatment and prevention and answering questions.  Antony Contras, MD Medical Director [facility] Pager: [phone] 02/04/2022 3:55 PM  To contact Stroke Continuity provider, please refer to [email or web address] After hours, contact General Neurology

## 2022-02-04 NOTE — Progress Notes (Signed)
EEG complete - results pending 

## 2022-02-04 NOTE — Progress Notes (Incomplete)
Echocardiogram 2D Echocardiogram has been performed.  Olivia Ross 02/04/2022, 3:25 PM

## 2022-02-04 NOTE — Procedures (Signed)
Patient Name: Olivia Ross  MRN: [id number]  Epilepsy Attending: Lora Havens  Referring Physician/Provider: Rikki Spearing, NP  Date: 02/04/2022 Duration: 25.51 mins  Patient history: 76 y.o. female with a medical history significant for breast cancer, vascular dementia, seizure, CVA (2022), and HTN presenting with AMS. EEG to evaluate for seizure  Level of alertness: Awake  AEDs during EEG study: None  Technical aspects: This EEG study was done with scalp electrodes positioned according to the 10-20 International system of electrode placement. Electrical activity was reviewed with band pass filter of 1-'70Hz'$ , sensitivity of 7 uV/mm, display speed of 97m/sec with a '60Hz'$  notched filter applied as appropriate. EEG data were recorded continuously and digitally stored.  Video monitoring was available and reviewed as appropriate.  Description: The posterior dominant rhythm consists of 8 Hz activity of moderate voltage (25-35 uV) seen predominantly in posterior head regions, symmetric and reactive to eye opening and eye closing. EEG showed intermittent generalized 3 to 5 Hz theta-delta slowing. Hyperventilation and photic stimulation were not performed.     Of note, parts of the study were technically difficult due to significant myogenic artifact.  ABNORMALITY - Intermittent slow, generalized  IMPRESSION: This technically difficult study is suggestive of mild diffuse encephalopathy, nonspecific etiology. No seizures or epileptiform discharges were seen throughout the recording.  Jacqeline Broers OBarbra Sarks

## 2022-02-04 NOTE — Progress Notes (Signed)
PROGRESS NOTE    Olivia Ross  XTG:[id number] DOB: 1945-09-09 DOA: 02/02/2022 PCP: Jolinda Croak, MD   Brief Narrative:  Olivia Ross is a 76 y.o. female with medical history significant of breast cancer, vascular dementia, seizure, CVA (2022), and HTN presenting with AMS slurred speech, difficulty word finding, and questionable R arm weakness.   Assessment & Plan:   Principal Problem:   Acute CVA (cerebrovascular accident) ([place]) Active Problems:   Dementia without behavioral disturbance ([place])   Hypertension   Malignant neoplasm of upper-outer quadrant of right breast in female, estrogen receptor positive ([place])   Dyslipidemia  Acute CVA, L parietal cortex -Confirmed on MRI - acute L parietal cortex infarct -MRA negative for large vessel occlusion -Echo pending -Neurology following - DAPT/statin per their recommendations -PT/OT pending - disposition planning   HTN -Allow permissive HTN for now -Treat BP only if >220/120, and then with goal of 15% reduction -Hold amlodipine, valsartan and plan to restart in 24h   HLD -Continue Lipitor 80 mg daily   Questionable history of seizure -She was here in 07/2021 for possible seizure activity -She is not on AED at this time   Breast cancer -There is not clear data about the association of aromatase inhibitors and strokes -Will hold letrozole for now   Dementia -Will order delirium precautions   DVT prophylaxis: lovenox Code Status: Full Family Communication: None present  Status is: Inpatient  Dispo: The patient is from: Home               Anticipated d/c is to: To be determined              Anticipated d/c date is: 24 to 48 hours              Patient currently not medically stable for discharge  Consultants:  Neurology  Procedures:  As above  Antimicrobials:  None indicated  Subjective: No acute issues or events overnight  Objective: Vitals:   02/03/22 1952 02/03/22 2334 02/04/22 0350 02/04/22  0720  BP: (!) 172/74 (!) 170/69 (!) 172/60 (!) 176/71  Pulse: 62 (!) 53 (!) 49 (!) 52  Resp: '16 16 16 16  '$ Temp: 97.8 F (36.6 C) 97.6 F (36.4 C) (!) 97.5 F (36.4 C) 97.6 F (36.4 C)  TempSrc: Oral Oral Oral Oral  SpO2: 100% 100% 100% 100%   No intake or output data in the 24 hours ending 02/04/22 0742 There were no vitals filed for this visit.  Examination:  General:  Pleasantly resting in bed, No acute distress. HEENT:  Normocephalic atraumatic.  Sclerae nonicteric, noninjected.  Extraocular movements intact bilaterally. Neck:  Without mass or deformity.  Trachea is midline. Lungs:  Clear to auscultate bilaterally without rhonchi, wheeze, or rales. Heart:  Regular rate and rhythm.  Without murmurs, rubs, or gallops. Abdomen:  Soft, nontender, nondistended.  Without guarding or rebound. Extremities: Without cyanosis, clubbing, edema, or obvious deformity. Vascular:  Dorsalis pedis and posterior tibial pulses palpable bilaterally. Skin:  Warm and dry, no erythema, no ulcerations.  Data Reviewed: I have personally reviewed following labs and imaging studies  CBC: Recent Labs  Lab 02/02/22 2120 02/02/22 2145  WBC 11.6*  --   NEUTROABS 8.2*  --   HGB 12.3 12.9  HCT 37.4 38.0  MCV 97.1  --   PLT 143*  --    Basic Metabolic Panel: Recent Labs  Lab 02/02/22 2120 02/02/22 2145  NA 141 143  K 3.8 4.1  CL 110  108  CO2 23  --   GLUCOSE 89 85  BUN 16 18  CREATININE 0.71 0.70  CALCIUM 9.7  --    GFR: CrCl cannot be calculated (Unknown ideal weight.). Liver Function Tests: Recent Labs  Lab 02/02/22 2120  AST 32  ALT 28  ALKPHOS 99  BILITOT 0.9  PROT 5.9*  ALBUMIN 3.2*   No results for input(s): "LIPASE", "AMYLASE" in the last 168 hours. No results for input(s): "AMMONIA" in the last 168 hours. Coagulation Profile: No results for input(s): "INR", "PROTIME" in the last 168 hours. Cardiac Enzymes: No results for input(s): "CKTOTAL", "CKMB", "CKMBINDEX",  "TROPONINI" in the last 168 hours. BNP (last 3 results) No results for input(s): "PROBNP" in the last 8760 hours. HbA1C: Recent Labs    02/02/22 2136  HGBA1C 4.7*   CBG: Recent Labs  Lab 02/02/22 2121  GLUCAP 89   Lipid Profile: Recent Labs    02/04/22 0320  CHOL 159  HDL 51  LDLCALC 98  TRIG 49  CHOLHDL 3.1   Thyroid Function Tests: No results for input(s): "TSH", "T4TOTAL", "FREET4", "T3FREE", "THYROIDAB" in the last 72 hours. Anemia Panel: No results for input(s): "VITAMINB12", "FOLATE", "FERRITIN", "TIBC", "IRON", "RETICCTPCT" in the last 72 hours. Sepsis Labs: No results for input(s): "PROCALCITON", "LATICACIDVEN" in the last 168 hours.  Recent Results (from the past 240 hour(s))  Resp Panel by RT-PCR (Flu A&B, Covid) Anterior Nasal Swab     Status: None   Collection Time: 02/02/22  9:51 PM   Specimen: Anterior Nasal Swab  Result Value Ref Range Status   SARS Coronavirus 2 by RT PCR NEGATIVE NEGATIVE Final    Comment: (NOTE) SARS-CoV-2 target nucleic acids are NOT DETECTED.  The SARS-CoV-2 RNA is generally detectable in upper respiratory specimens during the acute phase of infection. The lowest concentration of SARS-CoV-2 viral copies this assay can detect is 138 copies/mL. A negative result does not preclude SARS-Cov-2 infection and should not be used as the sole basis for treatment or other patient management decisions. A negative result may occur with  improper specimen collection/handling, submission of specimen other than nasopharyngeal swab, presence of viral mutation(s) within the areas targeted by this assay, and inadequate number of viral copies(<138 copies/mL). A negative result must be combined with clinical observations, patient history, and epidemiological information. The expected result is Negative.  Fact Sheet for Patients:  EntrepreneurPulse.com.au  Fact Sheet for Healthcare Providers:   IncredibleEmployment.be  This test is no t yet approved or cleared by the [place] FDA and  has been authorized for detection and/or diagnosis of SARS-CoV-2 by FDA under an Emergency Use Authorization (EUA). This EUA will remain  in effect (meaning this test can be used) for the duration of the COVID-19 declaration under Section 564(b)(1) of the Act, 21 U.S.C.section 360bbb-3(b)(1), unless the authorization is terminated  or revoked sooner.       Influenza A by PCR NEGATIVE NEGATIVE Final   Influenza B by PCR NEGATIVE NEGATIVE Final    Comment: (NOTE) The Xpert Xpress SARS-CoV-2/FLU/RSV plus assay is intended as an aid in the diagnosis of influenza from Nasopharyngeal swab specimens and should not be used as a sole basis for treatment. Nasal washings and aspirates are unacceptable for Xpert Xpress SARS-CoV-2/FLU/RSV testing.  Fact Sheet for Patients: EntrepreneurPulse.com.au  Fact Sheet for Healthcare Providers: IncredibleEmployment.be  This test is not yet approved or cleared by the [place] FDA and has been authorized for detection and/or diagnosis of SARS-CoV-2 by FDA under  an Emergency Use Authorization (EUA). This EUA will remain in effect (meaning this test can be used) for the duration of the COVID-19 declaration under Section 564(b)(1) of the Act, 21 U.S.C. section 360bbb-3(b)(1), unless the authorization is terminated or revoked.  Performed at [facility], [place] [address], [place], [place] [address]          Radiology Studies: MR ANGIO HEAD WO CONTRAST  Result Date: 02/03/2022 CLINICAL DATA:  Follow-up examination for stroke. EXAM: MRA HEAD WITHOUT CONTRAST TECHNIQUE: Angiographic images of the Circle of Willis were acquired using MRA technique without intravenous contrast. COMPARISON:  Prior MRI from earlier the same day. FINDINGS: Anterior circulation: Examination degraded by motion.  Visualized distal cervical segments of the internal carotid arteries are patent with antegrade flow. Petrous segments patent bilaterally. Mild atheromatous change within the carotid siphons without significant stenosis. A1 segments patent bilaterally. Normal anterior communicating complex. Anterior cerebral arteries patent without visible stenosis. Focal severe stenosis present at the proximal right M1 segment (series 5, image 100). Scant attenuated flow seen distally within the right MCA distribution. Findings in keeping with the large remote right MCA territory infarct. Left M1 segment without high-grade stenosis. Suspected severe proximal left M2 stenosis (series 1036, image 11). Atheromatous irregularity throughout the MCA branches distally which remain patent. Posterior circulation: Vertebral arteries are largely codominant and patent without stenosis. Both PICA patent at their origins. Basilar patent without visible stenosis. Superior cerebral arteries patent bilaterally. Both PCAs primarily supplied via the basilar. Focal 2 mm outpouching extending anteriorly from the left P1/P2 junction favored to reflect a small left PCOM remnant (series 5, image 98). PCAs are patent to their distal aspects without proximal high-grade stenosis. Anatomic variants: None significant. Other: None. IMPRESSION: 1. Motion degraded exam. 2. Negative for acute large vessel occlusion. 3. Focal severe proximal right M1 stenosis, with scant attenuated flow seen distally within the right MCA distribution. Finding is in keeping with the large remote right MCA territory infarct. 4. Suspected severe proximal left M2 stenosis. 5. 2 mm outpouching extending anteriorly from the left P1/P2 junction, favored to reflect a small left PCOM remnant. Electronically Signed   By: Jeannine Boga M.D.   On: 02/03/2022 20:58   CT HEAD WO CONTRAST (5MM)  Result Date: 02/03/2022 CLINICAL DATA:  Follow-up examination for stroke. EXAM: CT HEAD  WITHOUT CONTRAST TECHNIQUE: Contiguous axial images were obtained from the base of the skull through the vertex without intravenous contrast. RADIATION DOSE REDUCTION: This exam was performed according to the departmental dose-optimization program which includes automated exposure control, adjustment of the mA and/or kV according to patient size and/or use of iterative reconstruction technique. COMPARISON:  Prior brain MRI from earlier the same day. FINDINGS: Brain: Small area of evolving cytotoxic edema seen at the posterior left frontal region, consistent with an evolving acute left MCA distribution infarct, and relatively stable from prior MRI. No associated hemorrhage or mass effect. No other acute intracranial hemorrhage or visible large vessel territory infarct. No mass lesion or midline shift. No hydrocephalus or extra-axial fluid collection. Atrophy with chronic small vessel ischemic disease with multiple remote lacunar infarcts about the bilateral basal ganglia. Scattered chronic bilateral cerebellar infarcts. Remote right MCA and left ACA territory infarcts noted. Vascular: No abnormal hyperdense vessel. Scattered vascular calcifications noted within the carotid siphons. Skull: Scalp soft tissues and calvarium demonstrate no new finding. Sinuses/Orbits: Left gaze noted. Globes orbital soft tissues demonstrate no other acute finding. Paranasal sinuses are clear. No mastoid effusion. Other:  None. IMPRESSION: 1. Small area of evolving cytotoxic edema at the posterior left frontal region, consistent with an evolving acute left MCA distribution infarct, stable from prior MRI. No associated hemorrhage or mass effect. 2. No other new acute intracranial abnormality. 3. Atrophy with extensive chronic ischemic changes, stable. Electronically Signed   By: Jeannine Boga M.D.   On: 02/03/2022 20:31   VAS US CAROTID (at [facility] and [facility] only)  Result Date: 02/03/2022 Carotid Arterial Duplex Study Patient Name:   ALARA DANIEL  Date of Exam:   02/03/2022 Medical Rec #: [id number]       Accession #:    [id number] Date of Birth: [birth year]       Patient Gender: F Patient Age:   8 years Exam Location:  [facility] Procedure:      VAS US CAROTID Referring Phys: Janine Ores --------------------------------------------------------------------------------  Indications:       CVA. Risk Factors:      Hypertension, prior CVA. Limitations        Today's exam was limited due to talking & patient movement. Comparison Study:  no prior Performing Technologist: Archie Patten RVS  Examination Guidelines: A complete evaluation includes B-mode imaging, spectral Doppler, color Doppler, and power Doppler as needed of all accessible portions of each vessel. Bilateral testing is considered an integral part of a complete examination. Limited examinations for reoccurring indications may be performed as noted.  Right Carotid Findings: +----------+--------+--------+--------+------------------+--------+           PSV cm/sEDV cm/sStenosisPlaque DescriptionComments +----------+--------+--------+--------+------------------+--------+ CCA Prox  51      6               heterogenous               +----------+--------+--------+--------+------------------+--------+ CCA Distal65      5               heterogenous               +----------+--------+--------+--------+------------------+--------+ ICA Prox  58      10              heterogenous               +----------+--------+--------+--------+------------------+--------+ ICA Distal52      9                                          +----------+--------+--------+--------+------------------+--------+ ECA       141                                                +----------+--------+--------+--------+------------------+--------+ +----------+--------+-------+--------+-------------------+           PSV cm/sEDV cmsDescribeArm Pressure (mmHG)  +----------+--------+-------+--------+-------------------+ [id number]                                        +----------+--------+-------+--------+-------------------+ +---------+--------+--+--------+-+ VertebralPSV cm/s38EDV cm/s8 +---------+--------+--+--------+-+  Left Carotid Findings: +----------+--------+--------+--------+------------------+--------+           PSV cm/sEDV cm/sStenosisPlaque DescriptionComments +----------+--------+--------+--------+------------------+--------+ CCA Prox  59      9               heterogenous               +----------+--------+--------+--------+------------------+--------+  CCA Distal54      7               heterogenous               +----------+--------+--------+--------+------------------+--------+ ICA Prox  34      8               heterogenous               +----------+--------+--------+--------+------------------+--------+ ICA Distal24      5                                          +----------+--------+--------+--------+------------------+--------+ ECA       69                                                 +----------+--------+--------+--------+------------------+--------+ +----------+--------+--------+--------+-------------------+           PSV cm/sEDV cm/sDescribeArm Pressure (mmHG) +----------+--------+--------+--------+-------------------+ [id number]                                          +----------+--------+--------+--------+-------------------+ +---------+--------+--+--------+-+---------+ VertebralPSV cm/s57EDV cm/s9Antegrade +---------+--------+--+--------+-+---------+   Summary: Right Carotid: Velocities in the right ICA are consistent with a 1-39% stenosis. Left Carotid: Velocities in the left ICA are consistent with a 1-39% stenosis. Vertebrals: Bilateral vertebral arteries demonstrate antegrade flow. *See table(s) above for measurements and observations.  Electronically signed by  Antony Contras MD on 02/03/2022 at 5:07:17 PM.    Final    MR BRAIN WO CONTRAST  Result Date: 02/03/2022 CLINICAL DATA:  TIA.  Confusion EXAM: MRI HEAD WITHOUT CONTRAST TECHNIQUE: Multiplanar, multiecho pulse sequences of the brain and surrounding structures were obtained without intravenous contrast. COMPARISON:  08/26/2021 FINDINGS: Moderate area of cortical restricted diffusion at the left parietal cortex. Chronic infarcts seen in the bilateral cerebellum, posterior division right MCA territory, bilateral basal ganglia, and parasagittal left frontal lobe. No hydrocephalus or shift. IMPRESSION: 1. Acute left parietal cortex infarct involving a moderate area. 2. Multiple remote infarcts. 3. Truncated study, only motion degraded diffusion images were acquired. Electronically Signed   By: Jorje Guild M.D.   On: 02/03/2022 08:11   DG Abdomen 1 View  Result Date: 02/03/2022 CLINICAL DATA:  Clearance for MRI EXAM: ABDOMEN - 1 VIEW COMPARISON:  None similar FINDINGS: Artifact from EKG leads. Cholecystectomy clips. Normal bowel gas pattern. Punctate high-density areas are attributed to fecal contents. Lumbar spine degeneration and scoliosis. IMPRESSION: No acute finding.  No contraindication to MRI. Electronically Signed   By: Jorje Guild M.D.   On: 02/03/2022 05:57   DG Chest Portable 1 View  Result Date: 02/03/2022 CLINICAL DATA:  Clearance for MRI EXAM: PORTABLE CHEST 1 VIEW COMPARISON:  08/06/2021 FINDINGS: Chest wall clips for lumpectomy and right axillary dissection. Normal heart size and mediastinal contours. There is no edema, consolidation, effusion, or pneumothorax. Artifact from EKG leads. IMPRESSION: No evidence of active disease. No contraindication to MRI at the chest Electronically Signed   By: Jorje Guild M.D.   On: 02/03/2022 05:56   CT Head Wo Contrast  Result Date: 02/02/2022 CLINICAL DATA:  Altered mental status. EXAM: CT HEAD WITHOUT CONTRAST TECHNIQUE:  Contiguous axial images  were obtained from the base of the skull through the vertex without intravenous contrast. RADIATION DOSE REDUCTION: This exam was performed according to the departmental dose-optimization program which includes automated exposure control, adjustment of the mA and/or kV according to patient size and/or use of iterative reconstruction technique. COMPARISON:  July 07, 2021 FINDINGS: Brain: There is mild cerebral atrophy with widening of the extra-axial spaces and ventricular dilatation. There are areas of decreased attenuation within the white matter tracts of the supratentorial brain, consistent with microvascular disease changes. Chronic left frontal lobe, right temporal lobe and right parieto-occipital infarcts are noted. Chronic bilateral basal ganglia lacunar infarcts are also seen. Vascular: There is marked severity calcification of the bilateral cavernous carotid arteries. Skull: Normal. Negative for fracture or focal lesion. Sinuses/Orbits: No acute finding. Other: None. IMPRESSION: 1. Generalized cerebral atrophy and chronic white matter small vessel ischemic changes without evidence of an acute intracranial abnormality. 2. Chronic left frontal lobe, right temporal lobe and right parieto-occipital infarcts. Electronically Signed   By: Virgina Norfolk M.D.   On: 02/02/2022 22:28    Scheduled Meds:   stroke: early stages of recovery book   Does not apply Once   atorvastatin  80 mg Oral Daily   enoxaparin (LOVENOX) injection  40 mg Subcutaneous Q24H   influenza vaccine adjuvanted  0.5 mL Intramuscular Tomorrow-1000   magnesium oxide  200 mg Oral BID   multivitamin with minerals  1 tablet Oral Daily   QUEtiapine  25 mg Oral QHS   Continuous Infusions:   LOS: 1 day   Time spent: 38mn  Saif Peter C Mercie Balsley, DO [facility]  If 7PM-7AM, please contact night-coverage [email or web address]  02/04/2022, 7:42 AM

## 2022-02-04 NOTE — Evaluation (Signed)
Physical Therapy Evaluation Patient Details Name: Olivia Ross MRN: [id number] DOB: [birth year] Today's Date: 02/04/2022  History of Present Illness  Olivia Ross is a 76 y.o. female presenting with AMS. 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 PHMx:  significant of breast cancer, vascular dementia, seizure, CVA (2022), and HTN   Clinical Impression  Olivia Ross is 76 y.o. female admitted with above HPI and diagnosis. Patient is currently limited by functional impairments below (see PT problem list). Patient unable to provide home set up or PLOF and daughter unable to be reached via phone. Per chart review pt lives with daughter and has required assist for ADL's and mobilizes with RW at baseline. Currently pt requires Mod-Max +2 assist for all mobility and was able to stand and pivot bed>chair this session. Patient will benefit from continued skilled PT interventions to address impairments and progress independence with mobility, recommending HHPT with 24/7 assist/supervision from family or home aid. Acute PT will follow and progress as able.        Recommendations for follow up therapy are one component of a multi-disciplinary discharge planning process, led by the attending physician.  Recommendations may be updated based on patient status, additional functional criteria and insurance authorization.  Follow Up Recommendations Home health PT (with assist from family at home)      Assistance Recommended at Discharge Frequent or constant Supervision/Assistance  Patient can return home with the following  A lot of help with walking and/or transfers;A lot of help with bathing/dressing/bathroom;Assistance with cooking/housework;Direct supervision/assist for medications management;Assist for transportation;Help with stairs or ramp for entrance    Equipment Recommendations None recommended by PT (TBD)  Recommendations for Other Services       Functional Status Assessment Patient  has had a recent decline in their functional status and demonstrates the ability to make significant improvements in function in a reasonable and predictable amount of time.     Precautions / Restrictions Precautions Precautions: Fall Restrictions Weight Bearing Restrictions: No      Mobility  Bed Mobility Overal bed mobility: Needs Assistance Bed Mobility: Supine to Sit     Supine to sit: Max assist, +2 for physical assistance, +2 for safety/equipment, HOB elevated     General bed mobility comments: Max +2 to initiate feet off EOB and pivot hips with bed pad. Assist to raise trunk but pt able to maintain seated balance once upright and at EOB.    Transfers Overall transfer level: Needs assistance Equipment used: 2 person hand held assist, Rolling walker (2 wheels) Transfers: Sit to/from Stand, Bed to chair/wheelchair/BSC Sit to Stand: Mod assist, +2 physical assistance, +2 safety/equipment   Step pivot transfers: Mod assist, +2 physical assistance, +2 safety/equipment       General transfer comment: Mod +2 for power up from EOB, HHA, pt able to initiate with bil LE's against bed and slight posterior lean at start of rise. Mod +2 to guide steps/weight shift to move bed>chair. sit<>stand from recliner for pericare to change pad mesh panty. Min assist to steady balance with RW for support in standing.    Ambulation/Gait                  Stairs            Wheelchair Mobility    Modified Rankin (Stroke Patients Only)       Balance Overall balance assessment: Needs assistance Sitting-balance support: Feet supported, Bilateral upper extremity supported Sitting balance-Leahy Scale: Fair  Standing balance support: Reliant on assistive device for balance, Bilateral upper extremity supported Standing balance-Leahy Scale: Poor Standing balance comment: reliant on RW and/or therapist for external support                             Pertinent  Vitals/Pain Pain Assessment Pain Assessment: PAINAD Breathing: normal Negative Vocalization: none Facial Expression: smiling or inexpressive Body Language: relaxed Consolability: no need to console PAINAD Score: 0 Pain Intervention(s): Monitored during session    Home Living Family/patient expects to be discharged to:: Private residence Living Arrangements: Children Available Help at Discharge: Family;Available PRN/intermittently Type of Home: Apartment Home Access: Stairs to enter Entrance Stairs-Rails: Right Entrance Stairs-Number of Steps: flight   Home Layout: One level Home Equipment: Conservation officer, nature (2 wheels);Shower seat Additional Comments: pt unable to provide PLOF or home set up secondary to dementia. attempted to call pt's daughter, no answer, will attempt additional follow up as able. home set up and PLOF per chart review from prior admissions in 2022 & 2023.    Prior Function Prior Level of Function : Needs assist             Mobility Comments: uses RW with assist of dtr per RN ADLs Comments: pt reports her dtr helps her with ADLs     Hand Dominance   Dominant Hand: Right    Extremity/Trunk Assessment   Upper Extremity Assessment Upper Extremity Assessment: Generalized weakness    Lower Extremity Assessment Lower Extremity Assessment: RLE deficits/detail;LLE deficits/detail;Generalized weakness RLE Deficits / Details: light touch absent below knee bil LE, pt with delayed response to noxious stimuli on Rt LE compared to Lt LE. pt unable to follow commands for MMT and grassly week throughout of 3/5 or less. RLE Sensation: decreased light touch RLE Coordination: decreased gross motor;decreased fine motor LLE Deficits / Details: light touch absent below knee bil LE, pt with delayed response to noxious stimuli on Rt LE compared to Lt LE. pt unable to follow commands for MMT and grassly week throughout of 3/5 or less. LLE Coordination: decreased gross  motor;decreased fine motor    Cervical / Trunk Assessment Cervical / Trunk Assessment: Normal  Communication   Communication: No difficulties  Cognition Arousal/Alertness: Awake/alert Behavior During Therapy: WFL for tasks assessed/performed Overall Cognitive Status: History of cognitive impairments - at baseline                                 General Comments: pt pleasant and willing to participate in therapy. oriented to self only and able to recall names of her daugther and her mother.        General Comments      Exercises     Assessment/Plan    PT Assessment Patient needs continued PT services  PT Problem List Decreased strength;Decreased activity tolerance;Decreased balance;Decreased mobility;Decreased cognition;Decreased coordination;Decreased knowledge of use of DME;Decreased safety awareness;Decreased knowledge of precautions;Impaired sensation       PT Treatment Interventions DME instruction;Therapeutic exercise;Therapeutic activities;Functional mobility training;Stair training;Gait training;Balance training;Neuromuscular re-education;Cognitive remediation;Patient/family education    PT Goals (Current goals can be found in the Care Plan section)  Acute Rehab PT Goals PT Goal Formulation: Patient unable to participate in goal setting Time For Goal Achievement: 02/18/22 Potential to Achieve Goals: Fair    Frequency Min 3X/week     Co-evaluation   Reason for Co-Treatment: For patient/therapist safety;To address functional/ADL  transfers;Necessary to address cognition/behavior during functional activity           AM-PAC PT "6 Clicks" Mobility  Outcome Measure Help needed turning from your back to your side while in a flat bed without using bedrails?: A Lot Help needed moving from lying on your back to sitting on the side of a flat bed without using bedrails?: A Lot Help needed moving to and from a bed to a chair (including a wheelchair)?: A  Lot Help needed standing up from a chair using your arms (e.g., wheelchair or bedside chair)?: A Lot Help needed to walk in hospital room?: A Lot Help needed climbing 3-5 steps with a railing? : Total 6 Click Score: 11    End of Session Equipment Utilized During Treatment: Gait belt Activity Tolerance: Patient tolerated treatment well Patient left: in chair;with call bell/phone within reach;with chair alarm set Nurse Communication: Mobility status;Need for lift equipment (2+ assist) PT Visit Diagnosis: Unsteadiness on feet (R26.81);Muscle weakness (generalized) (M62.81);Difficulty in walking, not elsewhere classified (R26.2);Other abnormalities of gait and mobility (R26.89)    Time: 1010-1029 PT Time Calculation (min) (ACUTE ONLY): 19 min   Charges:   PT Evaluation $PT Eval Moderate Complexity: 1 Mod          Verner Mould, DPT Acute Rehabilitation Services Office [phone]  02/04/22 10:50 AM

## 2022-02-04 NOTE — Progress Notes (Signed)
Initial Nutrition Assessment  DOCUMENTATION CODES:   Severe malnutrition in context of social or environmental circumstances  INTERVENTION:  - Liberalize diet to Regular  - Assistance with meals due to confusion  - Ensure Enlive po TID, each supplement provides 350 kcal and 20 grams of protein. - Magnesium BID per MD - Daily multivitamin with minerals to support micronutrient needs   NUTRITION DIAGNOSIS:   Severe Malnutrition related to social / environmental circumstances as evidenced by severe fat depletion, severe muscle depletion, 26.5% percent weight loss in 6 months.  GOAL:   Patient will meet greater than or equal to 90% of their needs  MONITOR:   PO intake, Supplement acceptance  REASON FOR ASSESSMENT:   Consult Assessment of nutrition requirement/status  ASSESSMENT:   76 y.o. female with PMH breast cancer, Dementia, CVA, HTN, and seizures who presented with AMS after CVA.   Patient noted to be admitted with AMS and somewhat confused during visit today but answered most questions appropriately. Unsure of a UBW or weight changes within the past year. Per chart review of previous weights, weight stable November 2022 to May 2023 but no new weight taken since May. Pt reports eating 3 meals a day at home and notes she is currently hungry. Breakfast tray arrived during visit. Set up tray for patient and patient began eating during visit. Agreeable to try nutrition supplements.  Discussed getting a new weight with nurse and nurse weighed patient at 105#. From previous weight in May to current weight is a 38# or 26.5% weight loss in 6 months.   Medications reviewed and include: Magnesium BID, multivitamin with minerals, Senokot prn  Labs reviewed:  No Comprehensive Metabolic Panel since 16/1   NUTRITION - FOCUSED PHYSICAL EXAM:  Flowsheet Row Most Recent Value  Orbital Region Severe depletion  Upper Arm Region Severe depletion  Thoracic and Lumbar Region Severe  depletion  Buccal Region Severe depletion  Temple Region Severe depletion  Clavicle Bone Region Severe depletion  Clavicle and Acromion Bone Region Severe depletion  Scapular Bone Region Unable to assess  Dorsal Hand Severe depletion  Patellar Region Severe depletion  Anterior Thigh Region Severe depletion  Posterior Calf Region Severe depletion  Edema (RD Assessment) None  Hair Reviewed  Eyes Reviewed  Mouth Reviewed  Skin Reviewed  Nails Reviewed       Diet Order:   Diet Order             Diet Heart Room service appropriate? Yes; Fluid consistency: Thin  Diet effective ____                   EDUCATION NEEDS:   No education needs have been identified at this time  Skin:  Skin Assessment: Reviewed RN Assessment  Last BM:  11/4  Height:  Ht Readings from Last 1 Encounters:  08/26/21 '5\' 5"'$  (1.651 m)   Weight:  Wt Readings from Last 1 Encounters:  02/04/22 47.9 kg    BMI:  Body mass index is 17.57 kg/m.  Estimated Nutritional Needs:  Kcal:  1450-1700 Protein:  70-95 Fluid:  1450-1700    Samson Frederic RD, LDN For contact information, refer to [facility].

## 2022-02-05 DIAGNOSIS — I639 Cerebral infarction, unspecified: Secondary | ICD-10-CM | POA: Diagnosis not present

## 2022-02-05 MED ORDER — CLOPIDOGREL BISULFATE 75 MG PO TABS: 75.0000 mg | ORAL_TABLET | Freq: Every day | ORAL | 0 refills | Status: AC

## 2022-02-05 MED ORDER — EZETIMIBE 10 MG PO TABS: 10.0000 mg | ORAL_TABLET | Freq: Every day | ORAL | 2 refills | Status: AC

## 2022-02-05 MED ORDER — ASPIRIN 81 MG PO TBEC: 81.0000 mg | DELAYED_RELEASE_TABLET | Freq: Every day | ORAL | 11 refills | Status: AC

## 2022-02-05 NOTE — Plan of Care (Signed)

## 2022-02-05 NOTE — Discharge Summary (Signed)
Physician Discharge Summary  Olivia Ross STM:[id number] DOB: [birth year] DOA: 02/02/2022  PCP: Olivia Croak, MD  Admit date: 02/02/2022 Discharge date: 02/05/2022  Admitted From: Home Disposition: Home  Recommendations for Outpatient Follow-up:  Follow up with PCP in 1-2 weeks Follow-up with neurology as scheduled  Home Health: PT Equipment/Devices: None   Discharge Condition: Stable CODE STATUS: Full Diet recommendation:    Brief/Interim Summary: Olivia Ross is a 76 y.o. female with medical history significant of breast cancer, vascular dementia, seizure, CVA (2022), and HTN presenting with AMS slurred speech, difficulty word finding, and questionable R arm weakness.   Patient admitted as above, acute left parietal cortex infarcts confirmed on imaging.  Questionably embolic in origin.  No clear etiology, neurology recommending aspirin and Plavix for 21 days, then aspirin monotherapy alone. High suspicion for A-fib undiagnosed at this time, outpatient follow-up with PCP and neurology for further testing to confirm this suspicion.  Questionable syncopal episodes concerning for seizure are likely secondary to underlying strokes per neurology.  Patient otherwise back to baseline otherwise stable and agreeable for discharge home with home therapy per PT.   Discharge Diagnoses:  Principal Problem:   Acute CVA (cerebrovascular accident) ([place]) Active Problems:   Dementia without behavioral disturbance ([place])   Hypertension   Malignant neoplasm of upper-outer quadrant of right breast in female, estrogen receptor positive ([place])   Dyslipidemia   Protein-calorie malnutrition, severe   Acute CVA, L parietal cortex -Confirmed on MRI - acute L parietal cortex infarct -MRA negative for large vessel occlusion -Echo grade 2 diastolic dysfunction EF 60 to 65% -Neurology following - DAPT/statin per their recommendations -Add Zetia given lipid panel -PT/OT -recommending home health    HTN -Resume home medications amlodipine and valsartan at discharge   HLD -Continue Lipitor 80 mg daily   Questionable history of seizure -She was here in 07/2021 for possible seizure activity -likely secondary to strokes per neurology -She is not on AED at this time   Breast cancer -There is not clear data about the association of aromatase inhibitors and strokes -Recommend outpatient follow-up   Dementia -Appears to be at baseline  Discharge Instructions   Allergies as of 02/05/2022       Reactions   Diphth-acell Pertussis-tetanus    Other reaction(s): Other (See Comments)   Dtap-hepatitis B Recomb-ipv Other (See Comments)   unknown   Norco [hydrocodone-acetaminophen] Nausea And Vomiting   Percocet [oxycodone-acetaminophen] Nausea And Vomiting        Medication List     STOP taking these medications    ferrous sulfate 325 (65 FE) MG tablet       TAKE these medications    acetaminophen 500 MG tablet Commonly known as: TYLENOL Take 500 mg by mouth every 6 (six) hours as needed for moderate pain.   amLODipine 5 MG tablet Commonly known as: NORVASC Take 1 tablet (5 mg total) by mouth daily.   aspirin EC 81 MG tablet Take 1 tablet (81 mg total) by mouth daily. Swallow whole.   atorvastatin 80 MG tablet Commonly known as: LIPITOR Take 1 tablet (80 mg total) by mouth daily.   carboxymethylcellulose 0.5 % Soln Commonly known as: REFRESH PLUS Place 1 drop into both eyes 3 (three) times daily as needed (dry eyes).   clopidogrel 75 MG tablet Commonly known as: Plavix Take 1 tablet (75 mg total) by mouth daily for 21 days.   cyanocobalamin 100 MCG tablet Take 1 tablet (100 mcg total) by mouth daily.  dicyclomine 10 MG capsule Commonly known as: BENTYL Take 10 mg by mouth daily as needed for spasms.   ezetimibe 10 MG tablet Commonly known as: ZETIA Take 1 tablet (10 mg total) by mouth daily. Start taking on: February 06, 2022   letrozole 2.5 MG  tablet Commonly known as: FEMARA Take 2.5 mg by mouth daily.   loperamide 2 MG tablet Commonly known as: IMODIUM A-D Take 2 mg by mouth 4 (four) times daily as needed for diarrhea or loose stools.   magnesium oxide 400 (240 Mg) MG tablet Commonly known as: MAG-OX Take 0.5 tablets (200 mg total) by mouth 2 (two) times daily.   multivitamin with minerals Tabs tablet Take 1 tablet by mouth daily. Centrum   valsartan 160 MG tablet Commonly known as: DIOVAN Take 160 mg by mouth daily.        Allergies  Allergen Reactions   Diphth-Acell Pertussis-Tetanus     Other reaction(s): Other (See Comments)   Dtap-Hepatitis B Recomb-Ipv Other (See Comments)    unknown   Norco [Hydrocodone-Acetaminophen] Nausea And Vomiting   Percocet [Oxycodone-Acetaminophen] Nausea And Vomiting    Consultations: Neurology  Procedures/Studies: ECHOCARDIOGRAM COMPLETE  Result Date: 02/04/2022    ECHOCARDIOGRAM REPORT   Patient Name:   Olivia Ross Date of Exam: 02/04/2022 Medical Rec #:  [id number]      Height:       65.0 in Accession #:    [id number]     Weight:       105.6 lb Date of Birth:  1945-06-16      BSA:          1.508 m Patient Age:    36 years       BP:           176/71 mmHg Patient Gender: F              HR:           61 bpm. Exam Location:  Inpatient Procedure: 2D Echo, Cardiac Doppler and Color Doppler Indications:    Stroke I63.9  History:        Patient has no prior history of Echocardiogram examinations.                 Stroke, Arrythmias:Bradycardia; Risk Factors:Hypertension and                 Dyslipidemia.  Sonographer:    Ronny Flurry Referring Phys: [id number] Theresa  1. Left ventricular ejection fraction, by estimation, is 60 to 65%. The left ventricle has normal function. The left ventricle has no regional wall motion abnormalities. There is mild asymmetric left ventricular hypertrophy of the basal-septal segment. Left ventricular diastolic parameters are consistent  with Grade II diastolic dysfunction (pseudonormalization). Elevated left atrial pressure.  2. Right ventricular systolic function is normal. The right ventricular size is normal. There is normal pulmonary artery systolic pressure. The estimated right ventricular systolic pressure is 48.5 mmHg.  3. Left atrial size was mildly dilated.  4. The mitral valve is abnormal. Trivial mitral valve regurgitation. No evidence of mitral stenosis. Moderate mitral annular calcification.  5. The aortic valve is tricuspid. Aortic valve regurgitation is not visualized. No aortic stenosis is present.  6. The inferior vena cava is normal in size with greater than 50% respiratory variability, suggesting right atrial pressure of 3 mmHg. FINDINGS  Left Ventricle: Left ventricular ejection fraction, by estimation, is 60 to 65%. The left ventricle has normal function. The left ventricle  has no regional wall motion abnormalities. The left ventricular internal cavity size was normal in size. There is  mild asymmetric left ventricular hypertrophy of the basal-septal segment. Left ventricular diastolic parameters are consistent with Grade II diastolic dysfunction (pseudonormalization). Elevated left atrial pressure. Right Ventricle: The right ventricular size is normal. No increase in right ventricular wall thickness. Right ventricular systolic function is normal. There is normal pulmonary artery systolic pressure. The tricuspid regurgitant velocity is 2.09 m/s, and  with an assumed right atrial pressure of 3 mmHg, the estimated right ventricular systolic pressure is 66.4 mmHg. Left Atrium: Left atrial size was mildly dilated. Right Atrium: Right atrial size was normal in size. Pericardium: There is no evidence of pericardial effusion. Mitral Valve: The mitral valve is abnormal. Moderate mitral annular calcification. Trivial mitral valve regurgitation. No evidence of mitral valve stenosis. Tricuspid Valve: The tricuspid valve is normal in  structure. Tricuspid valve regurgitation is trivial. Aortic Valve: The aortic valve is tricuspid. Aortic valve regurgitation is not visualized. No aortic stenosis is present. Aortic valve mean gradient measures 2.0 mmHg. Aortic valve peak gradient measures 3.9 mmHg. Aortic valve area, by VTI measures 1.51 cm. Pulmonic Valve: The pulmonic valve was not well visualized. Pulmonic valve regurgitation is not visualized. Aorta: The aortic root is normal in size and structure. Venous: The inferior vena cava is normal in size with greater than 50% respiratory variability, suggesting right atrial pressure of 3 mmHg. IAS/Shunts: The interatrial septum was not well visualized.  LEFT VENTRICLE PLAX 2D LVIDd:         3.75 cm     Diastology LVIDs:         3.10 cm     LV e' medial:    4.68 cm/s LV PW:         0.95 cm     LV E/e' medial:  16.5 LV IVS:        0.90 cm     LV e' lateral:   5.55 cm/s LVOT diam:     1.55 cm     LV E/e' lateral: 13.9 LV SV:         33 LV SV Index:   22 LVOT Area:     1.89 cm                             3D Volume EF: LV Volumes (MOD)           3D EF:        59 % LV vol d, MOD A2C: 89.0 ml LV EDV:       87 ml LV vol d, MOD A4C: 56.0 ml LV ESV:       36 ml LV vol s, MOD A2C: 32.2 ml LV SV:        52 ml LV vol s, MOD A4C: 20.6 ml LV SV MOD A2C:     56.8 ml LV SV MOD A4C:     56.0 ml LV SV MOD BP:      48.4 ml RIGHT VENTRICLE             IVC RV Basal diam:  2.60 cm     IVC diam: 1.40 cm RV S prime:     10.70 cm/s TAPSE (M-mode): 3.0 cm LEFT ATRIUM             Index        RIGHT ATRIUM  Index LA diam:        4.10 cm 2.72 cm/m   RA Area:     10.50 cm LA Vol (A2C):   42.6 ml 28.25 ml/m  RA Volume:   20.50 ml  13.59 ml/m LA Vol (A4C):   62.5 ml 41.45 ml/m LA Biplane Vol: 52.0 ml 34.48 ml/m  AORTIC VALVE AV Area (Vmax):    1.53 cm AV Area (Vmean):   1.40 cm AV Area (VTI):     1.51 cm AV Vmax:           99.35 cm/s AV Vmean:          68.250 cm/s AV VTI:            0.220 m AV Peak Grad:      3.9  mmHg AV Mean Grad:      2.0 mmHg LVOT Vmax:         80.60 cm/s LVOT Vmean:        50.600 cm/s LVOT VTI:          0.176 m LVOT/AV VTI ratio: 0.80  AORTA Ao Root diam: 2.90 cm MITRAL VALVE               TRICUSPID VALVE MV Area (PHT): 3.21 cm    TR Peak grad:   17.5 mmHg MV Decel Time: 236 msec    TR Vmax:        209.00 cm/s MV E velocity: 77.10 cm/s MV A velocity: 81.90 cm/s  SHUNTS MV E/A ratio:  0.94        Systemic VTI:  0.18 m                            Systemic Diam: 1.55 cm Oswaldo Milian MD Electronically signed by Oswaldo Milian MD Signature Date/Time: 02/04/2022/4:32:50 PM    Final    EEG adult  Result Date: 02/04/2022 Lora Havens, MD     02/04/2022  1:10 PM Patient Name: Olivia Ross MRN: [id number] Epilepsy Attending: Lora Havens Referring Physician/Provider: Rikki Spearing, NP Date: 02/04/2022 Duration: 25.51 mins Patient history: 76 y.o. female with a medical history significant for breast cancer, vascular dementia, seizure, CVA (2022), and HTN presenting with AMS. EEG to evaluate for seizure Level of alertness: Awake AEDs during EEG study: None Technical aspects: This EEG study was done with scalp electrodes positioned according to the 10-20 International system of electrode placement. Electrical activity was reviewed with band pass filter of 1-'70Hz'$ , sensitivity of 7 uV/mm, display speed of 87m/sec with a '60Hz'$  notched filter applied as appropriate. EEG data were recorded continuously and digitally stored.  Video monitoring was available and reviewed as appropriate. Description: The posterior dominant rhythm consists of 8 Hz activity of moderate voltage (25-35 uV) seen predominantly in posterior head regions, symmetric and reactive to eye opening and eye closing. EEG showed intermittent generalized 3 to 5 Hz theta-delta slowing. Hyperventilation and photic stimulation were not performed.   Of note, parts of the study were technically difficult due to significant myogenic  artifact. ABNORMALITY - Intermittent slow, generalized IMPRESSION: This technically difficult study is suggestive of mild diffuse encephalopathy, nonspecific etiology. No seizures or epileptiform discharges were seen throughout the recording. PLora Havens  MR ANGIO HEAD WO CONTRAST  Result Date: 02/03/2022 CLINICAL DATA:  Follow-up examination for stroke. EXAM: MRA HEAD WITHOUT CONTRAST TECHNIQUE: Angiographic images of the Circle of Willis were acquired using MRA technique without  intravenous contrast. COMPARISON:  Prior MRI from earlier the same day. FINDINGS: Anterior circulation: Examination degraded by motion. Visualized distal cervical segments of the internal carotid arteries are patent with antegrade flow. Petrous segments patent bilaterally. Mild atheromatous change within the carotid siphons without significant stenosis. A1 segments patent bilaterally. Normal anterior communicating complex. Anterior cerebral arteries patent without visible stenosis. Focal severe stenosis present at the proximal right M1 segment (series 5, image 100). Scant attenuated flow seen distally within the right MCA distribution. Findings in keeping with the large remote right MCA territory infarct. Left M1 segment without high-grade stenosis. Suspected severe proximal left M2 stenosis (series 1036, image 11). Atheromatous irregularity throughout the MCA branches distally which remain patent. Posterior circulation: Vertebral arteries are largely codominant and patent without stenosis. Both PICA patent at their origins. Basilar patent without visible stenosis. Superior cerebral arteries patent bilaterally. Both PCAs primarily supplied via the basilar. Focal 2 mm outpouching extending anteriorly from the left P1/P2 junction favored to reflect a small left PCOM remnant (series 5, image 98). PCAs are patent to their distal aspects without proximal high-grade stenosis. Anatomic variants: None significant. Other: None.  IMPRESSION: 1. Motion degraded exam. 2. Negative for acute large vessel occlusion. 3. Focal severe proximal right M1 stenosis, with scant attenuated flow seen distally within the right MCA distribution. Finding is in keeping with the large remote right MCA territory infarct. 4. Suspected severe proximal left M2 stenosis. 5. 2 mm outpouching extending anteriorly from the left P1/P2 junction, favored to reflect a small left PCOM remnant. Electronically Signed   By: Jeannine Boga M.D.   On: 02/03/2022 20:58   CT HEAD WO CONTRAST (5MM)  Result Date: 02/03/2022 CLINICAL DATA:  Follow-up examination for stroke. EXAM: CT HEAD WITHOUT CONTRAST TECHNIQUE: Contiguous axial images were obtained from the base of the skull through the vertex without intravenous contrast. RADIATION DOSE REDUCTION: This exam was performed according to the departmental dose-optimization program which includes automated exposure control, adjustment of the mA and/or kV according to patient size and/or use of iterative reconstruction technique. COMPARISON:  Prior brain MRI from earlier the same day. FINDINGS: Brain: Small area of evolving cytotoxic edema seen at the posterior left frontal region, consistent with an evolving acute left MCA distribution infarct, and relatively stable from prior MRI. No associated hemorrhage or mass effect. No other acute intracranial hemorrhage or visible large vessel territory infarct. No mass lesion or midline shift. No hydrocephalus or extra-axial fluid collection. Atrophy with chronic small vessel ischemic disease with multiple remote lacunar infarcts about the bilateral basal ganglia. Scattered chronic bilateral cerebellar infarcts. Remote right MCA and left ACA territory infarcts noted. Vascular: No abnormal hyperdense vessel. Scattered vascular calcifications noted within the carotid siphons. Skull: Scalp soft tissues and calvarium demonstrate no new finding. Sinuses/Orbits: Left gaze noted. Globes  orbital soft tissues demonstrate no other acute finding. Paranasal sinuses are clear. No mastoid effusion. Other: None. IMPRESSION: 1. Small area of evolving cytotoxic edema at the posterior left frontal region, consistent with an evolving acute left MCA distribution infarct, stable from prior MRI. No associated hemorrhage or mass effect. 2. No other new acute intracranial abnormality. 3. Atrophy with extensive chronic ischemic changes, stable. Electronically Signed   By: Jeannine Boga M.D.   On: 02/03/2022 20:31   VAS US CAROTID (at [facility] and [facility] only)  Result Date: 02/03/2022 Carotid Arterial Duplex Study Patient Name:  Olivia Ross  Date of Exam:   02/03/2022 Medical Rec #: [id number]  Accession #:    [id number] Date of Birth: Dec 20, 1945       Patient Gender: F Patient Age:   49 years Exam Location:  [facility] Procedure:      VAS US CAROTID Referring Phys: Janine Ores --------------------------------------------------------------------------------  Indications:       CVA. Risk Factors:      Hypertension, prior CVA. Limitations        Today's exam was limited due to talking & patient movement. Comparison Study:  no prior Performing Technologist: Archie Patten RVS  Examination Guidelines: A complete evaluation includes B-mode imaging, spectral Doppler, color Doppler, and power Doppler as needed of all accessible portions of each vessel. Bilateral testing is considered an integral part of a complete examination. Limited examinations for reoccurring indications may be performed as noted.  Right Carotid Findings: +----------+--------+--------+--------+------------------+--------+           PSV cm/sEDV cm/sStenosisPlaque DescriptionComments +----------+--------+--------+--------+------------------+--------+ CCA Prox  51      6               heterogenous               +----------+--------+--------+--------+------------------+--------+ CCA Distal65      5                heterogenous               +----------+--------+--------+--------+------------------+--------+ ICA Prox  58      10              heterogenous               +----------+--------+--------+--------+------------------+--------+ ICA Distal52      9                                          +----------+--------+--------+--------+------------------+--------+ ECA       141                                                +----------+--------+--------+--------+------------------+--------+ +----------+--------+-------+--------+-------------------+           PSV cm/sEDV cmsDescribeArm Pressure (mmHG) +----------+--------+-------+--------+-------------------+ [id number]                                        +----------+--------+-------+--------+-------------------+ +---------+--------+--+--------+-+ VertebralPSV cm/s38EDV cm/s8 +---------+--------+--+--------+-+  Left Carotid Findings: +----------+--------+--------+--------+------------------+--------+           PSV cm/sEDV cm/sStenosisPlaque DescriptionComments +----------+--------+--------+--------+------------------+--------+ CCA Prox  59      9               heterogenous               +----------+--------+--------+--------+------------------+--------+ CCA Distal54      7               heterogenous               +----------+--------+--------+--------+------------------+--------+ ICA Prox  34      8               heterogenous               +----------+--------+--------+--------+------------------+--------+ ICA Distal24      5                                          +----------+--------+--------+--------+------------------+--------+  ECA       69                                                 +----------+--------+--------+--------+------------------+--------+ +----------+--------+--------+--------+-------------------+           PSV cm/sEDV cm/sDescribeArm Pressure (mmHG)  +----------+--------+--------+--------+-------------------+ [id number]                                          +----------+--------+--------+--------+-------------------+ +---------+--------+--+--------+-+---------+ VertebralPSV cm/s57EDV cm/s9Antegrade +---------+--------+--+--------+-+---------+   Summary: Right Carotid: Velocities in the right ICA are consistent with a 1-39% stenosis. Left Carotid: Velocities in the left ICA are consistent with a 1-39% stenosis. Vertebrals: Bilateral vertebral arteries demonstrate antegrade flow. *See table(s) above for measurements and observations.  Electronically signed by Antony Contras MD on 02/03/2022 at 5:07:17 PM.    Final    MR BRAIN WO CONTRAST  Result Date: 02/03/2022 CLINICAL DATA:  TIA.  Confusion EXAM: MRI HEAD WITHOUT CONTRAST TECHNIQUE: Multiplanar, multiecho pulse sequences of the brain and surrounding structures were obtained without intravenous contrast. COMPARISON:  08/26/2021 FINDINGS: Moderate area of cortical restricted diffusion at the left parietal cortex. Chronic infarcts seen in the bilateral cerebellum, posterior division right MCA territory, bilateral basal ganglia, and parasagittal left frontal lobe. No hydrocephalus or shift. IMPRESSION: 1. Acute left parietal cortex infarct involving a moderate area. 2. Multiple remote infarcts. 3. Truncated study, only motion degraded diffusion images were acquired. Electronically Signed   By: Jorje Guild M.D.   On: 02/03/2022 08:11   DG Abdomen 1 View  Result Date: 02/03/2022 CLINICAL DATA:  Clearance for MRI EXAM: ABDOMEN - 1 VIEW COMPARISON:  None similar FINDINGS: Artifact from EKG leads. Cholecystectomy clips. Normal bowel gas pattern. Punctate high-density areas are attributed to fecal contents. Lumbar spine degeneration and scoliosis. IMPRESSION: No acute finding.  No contraindication to MRI. Electronically Signed   By: Jorje Guild M.D.   On: 02/03/2022 05:57   DG Chest  Portable 1 View  Result Date: 02/03/2022 CLINICAL DATA:  Clearance for MRI EXAM: PORTABLE CHEST 1 VIEW COMPARISON:  08/06/2021 FINDINGS: Chest wall clips for lumpectomy and right axillary dissection. Normal heart size and mediastinal contours. There is no edema, consolidation, effusion, or pneumothorax. Artifact from EKG leads. IMPRESSION: No evidence of active disease. No contraindication to MRI at the chest Electronically Signed   By: Jorje Guild M.D.   On: 02/03/2022 05:56   CT Head Wo Contrast  Result Date: 02/02/2022 CLINICAL DATA:  Altered mental status. EXAM: CT HEAD WITHOUT CONTRAST TECHNIQUE: Contiguous axial images were obtained from the base of the skull through the vertex without intravenous contrast. RADIATION DOSE REDUCTION: This exam was performed according to the departmental dose-optimization program which includes automated exposure control, adjustment of the mA and/or kV according to patient size and/or use of iterative reconstruction technique. COMPARISON:  July 07, 2021 FINDINGS: Brain: There is mild cerebral atrophy with widening of the extra-axial spaces and ventricular dilatation. There are areas of decreased attenuation within the white matter tracts of the supratentorial brain, consistent with microvascular disease changes. Chronic left frontal lobe, right temporal lobe and right parieto-occipital infarcts are noted. Chronic bilateral basal ganglia lacunar infarcts are also seen. Vascular: There is marked severity calcification of the bilateral cavernous carotid arteries. Skull:  Normal. Negative for fracture or focal lesion. Sinuses/Orbits: No acute finding. Other: None. IMPRESSION: 1. Generalized cerebral atrophy and chronic white matter small vessel ischemic changes without evidence of an acute intracranial abnormality. 2. Chronic left frontal lobe, right temporal lobe and right parieto-occipital infarcts. Electronically Signed   By: Virgina Norfolk M.D.   On: 02/02/2022 22:28      Subjective: No acute issues or events overnight back to baseline otherwise stable and agreeable for discharge home   Discharge Exam: Vitals:   02/04/22 2340 02/05/22 0400  BP: (!) 152/86 (!) 163/64  Pulse: 64 65  Resp: 16 18  Temp: 98.2 F (36.8 C) 97.7 F (36.5 C)  SpO2: 100% 98%   Vitals:   02/04/22 1548 02/04/22 1938 02/04/22 2340 02/05/22 0400  BP: (!) 181/61 (!) 154/64 (!) 152/86 (!) 163/64  Pulse: 61 66 64 65  Resp: '17 16 16 18  '$ Temp: 98 F (36.7 C) 98 F (36.7 C) 98.2 F (36.8 C) 97.7 F (36.5 C)  TempSrc: Oral Oral  Axillary  SpO2: 100% 100% 100% 98%  Weight:        General: Pt is alert, awake, not in acute distress Cardiovascular: RRR, S1/S2 +, no rubs, no gallops Respiratory: CTA bilaterally, no wheezing, no rhonchi Abdominal: Soft, NT, ND, bowel sounds + Extremities: no edema, no cyanosis    The results of significant diagnostics from this hospitalization (including imaging, microbiology, ancillary and laboratory) are listed below for reference.     Microbiology: Recent Results (from the past 240 hour(s))  Resp Panel by RT-PCR (Flu A&B, Covid) Anterior Nasal Swab     Status: None   Collection Time: 02/02/22  9:51 PM   Specimen: Anterior Nasal Swab  Result Value Ref Range Status   SARS Coronavirus 2 by RT PCR NEGATIVE NEGATIVE Final    Comment: (NOTE) SARS-CoV-2 target nucleic acids are NOT DETECTED.  The SARS-CoV-2 RNA is generally detectable in upper respiratory specimens during the acute phase of infection. The lowest concentration of SARS-CoV-2 viral copies this assay can detect is 138 copies/mL. A negative result does not preclude SARS-Cov-2 infection and should not be used as the sole basis for treatment or other patient management decisions. A negative result may occur with  improper specimen collection/handling, submission of specimen other than nasopharyngeal swab, presence of viral mutation(s) within the areas targeted by this  assay, and inadequate number of viral copies(<138 copies/mL). A negative result must be combined with clinical observations, patient history, and epidemiological information. The expected result is Negative.  Fact Sheet for Patients:  EntrepreneurPulse.com.au  Fact Sheet for Healthcare Providers:  IncredibleEmployment.be  This test is no t yet approved or cleared by the [place] FDA and  has been authorized for detection and/or diagnosis of SARS-CoV-2 by FDA under an Emergency Use Authorization (EUA). This EUA will remain  in effect (meaning this test can be used) for the duration of the COVID-19 declaration under Section 564(b)(1) of the Act, 21 U.S.C.section 360bbb-3(b)(1), unless the authorization is terminated  or revoked sooner.       Influenza A by PCR NEGATIVE NEGATIVE Final   Influenza B by PCR NEGATIVE NEGATIVE Final    Comment: (NOTE) The Xpert Xpress SARS-CoV-2/FLU/RSV plus assay is intended as an aid in the diagnosis of influenza from Nasopharyngeal swab specimens and should not be used as a sole basis for treatment. Nasal washings and aspirates are unacceptable for Xpert Xpress SARS-CoV-2/FLU/RSV testing.  Fact Sheet for Patients: EntrepreneurPulse.com.au  Fact Sheet for  Healthcare Providers: IncredibleEmployment.be  This test is not yet approved or cleared by the [place] and has been authorized for detection and/or diagnosis of SARS-CoV-2 by FDA under an Emergency Use Authorization (EUA). This EUA will remain in effect (meaning this test can be used) for the duration of the COVID-19 declaration under Section 564(b)(1) of the Act, 21 U.S.C. section 360bbb-3(b)(1), unless the authorization is terminated or revoked.  Performed at [facility], [place] [address] [place], [place] [address]      Labs: BNP (last 3 results) No results for input(s): "BNP" in the last 8760  hours. Basic Metabolic Panel: Recent Labs  Lab 02/02/22 2120 02/02/22 2145  NA 141 143  K 3.8 4.1  CL 110 108  CO2 23  --   GLUCOSE 89 85  BUN 16 18  CREATININE 0.71 0.70  CALCIUM 9.7  --    Liver Function Tests: Recent Labs  Lab 02/02/22 2120  AST 32  ALT 28  ALKPHOS 99  BILITOT 0.9  PROT 5.9*  ALBUMIN 3.2*   No results for input(s): "LIPASE", "AMYLASE" in the last 168 hours. No results for input(s): "AMMONIA" in the last 168 hours. CBC: Recent Labs  Lab 02/02/22 2120 02/02/22 2145  WBC 11.6*  --   NEUTROABS 8.2*  --   HGB 12.3 12.9  HCT 37.4 38.0  MCV 97.1  --   PLT 143*  --    Cardiac Enzymes: No results for input(s): "CKTOTAL", "CKMB", "CKMBINDEX", "TROPONINI" in the last 168 hours. BNP: Invalid input(s): "POCBNP" CBG: Recent Labs  Lab 02/02/22 2121  GLUCAP 89   D-Dimer No results for input(s): "DDIMER" in the last 72 hours. Hgb A1c Recent Labs    02/02/22 2136  HGBA1C 4.7*   Lipid Profile Recent Labs    02/04/22 0320  CHOL 159  HDL 51  LDLCALC 98  TRIG 49  CHOLHDL 3.1   Thyroid function studies No results for input(s): "TSH", "T4TOTAL", "T3FREE", "THYROIDAB" in the last 72 hours.  Invalid input(s): "FREET3" Anemia work up No results for input(s): "VITAMINB12", "FOLATE", "FERRITIN", "TIBC", "IRON", "RETICCTPCT" in the last 72 hours. Urinalysis    Component Value Date/Time   COLORURINE YELLOW 02/02/2022 2342   APPEARANCEUR CLEAR 02/02/2022 2342   LABSPEC 1.018 02/02/2022 2342   PHURINE 5.0 02/02/2022 [place] 02/02/2022 2342   HGBUR NEGATIVE 02/02/2022 2342   BILIRUBINUR NEGATIVE 02/02/2022 2342   KETONESUR 5 (A) 02/02/2022 2342   PROTEINUR NEGATIVE 02/02/2022 2342   NITRITE NEGATIVE 02/02/2022 2342   LEUKOCYTESUR NEGATIVE 02/02/2022 2342   Sepsis Labs Recent Labs  Lab 02/02/22 2120  WBC 11.6*   Microbiology Recent Results (from the past 240 hour(s))  Resp Panel by RT-PCR (Flu A&B, Covid) Anterior  Nasal Swab     Status: None   Collection Time: 02/02/22  9:51 PM   Specimen: Anterior Nasal Swab  Result Value Ref Range Status   SARS Coronavirus 2 by RT PCR NEGATIVE NEGATIVE Final    Comment: (NOTE) SARS-CoV-2 target nucleic acids are NOT DETECTED.  The SARS-CoV-2 RNA is generally detectable in upper respiratory specimens during the acute phase of infection. The lowest concentration of SARS-CoV-2 viral copies this assay can detect is 138 copies/mL. A negative result does not preclude SARS-Cov-2 infection and should not be used as the sole basis for treatment or other patient management decisions. A negative result may occur with  improper specimen collection/handling, submission of specimen other than nasopharyngeal swab, presence of  viral mutation(s) within the areas targeted by this assay, and inadequate number of viral copies(<138 copies/mL). A negative result must be combined with clinical observations, patient history, and epidemiological information. The expected result is Negative.  Fact Sheet for Patients:  EntrepreneurPulse.com.au  Fact Sheet for Healthcare Providers:  IncredibleEmployment.be  This test is no t yet approved or cleared by the [place] FDA and  has been authorized for detection and/or diagnosis of SARS-CoV-2 by FDA under an Emergency Use Authorization (EUA). This EUA will remain  in effect (meaning this test can be used) for the duration of the COVID-19 declaration under Section 564(b)(1) of the Act, 21 U.S.C.section 360bbb-3(b)(1), unless the authorization is terminated  or revoked sooner.       Influenza A by PCR NEGATIVE NEGATIVE Final   Influenza B by PCR NEGATIVE NEGATIVE Final    Comment: (NOTE) The Xpert Xpress SARS-CoV-2/FLU/RSV plus assay is intended as an aid in the diagnosis of influenza from Nasopharyngeal swab specimens and should not be used as a sole basis for treatment. Nasal washings  and aspirates are unacceptable for Xpert Xpress SARS-CoV-2/FLU/RSV testing.  Fact Sheet for Patients: EntrepreneurPulse.com.au  Fact Sheet for Healthcare Providers: IncredibleEmployment.be  This test is not yet approved or cleared by the [place] FDA and has been authorized for detection and/or diagnosis of SARS-CoV-2 by FDA under an Emergency Use Authorization (EUA). This EUA will remain in effect (meaning this test can be used) for the duration of the COVID-19 declaration under Section 564(b)(1) of the Act, 21 U.S.C. section 360bbb-3(b)(1), unless the authorization is terminated or revoked.  Performed at [facility], [place] [address], [place], [place] [address]      Time coordinating discharge: Over 30 minutes  SIGNED:   Little Ishikawa, DO [facility] 02/05/2022, 11:22 AM Pager   If 7PM-7AM, please contact night-coverage [email or web address]

## 2022-02-05 NOTE — Plan of Care (Signed)

## 2022-02-05 NOTE — Progress Notes (Signed)
Attempted to call daughter Marifer Hurd to go over AVS and verify if patient had previous flu shot.  No answer and number in chart.

## 2022-02-05 NOTE — TOC Transition Note (Addendum)
Transition of Care [facility]) - CM/SW Discharge Note   Patient Details  Name: Olivia Ross MRN: [id number] Date of Birth: 1945/12/30  [facility] CM/SW Contact:  Pollie Friar, RN Phone Number: 02/05/2022, 11:57 AM   Clinical Narrative:    Pt is discharging home with home health services through [facility]. Information on the AVS.  No new DME needs.  Daughter over sees her medications at home. Daughter asked for PTAR home. Cm has verified the address and arranged transport. Bedside RN updated.    Final next level of care: Home w Home Health Services Barriers to Discharge: No Barriers Identified   Patient Goals and CMS Choice   CMS Medicare.gov Compare Post Acute Care list provided to:: Patient Represenative (must comment) Choice offered to / list presented to : Adult Children  Discharge Placement                       Discharge Plan and Services   Discharge Planning Services: CM Consult Post Acute Care Choice: Home Health                    HH Arranged: PT, OT [facility] Agency: [place] Date [facility] Agency Contacted: 02/05/22   Representative spoke with at [place]: [place]) Interventions     Readmission Risk Interventions     No data to display

## 2022-02-05 NOTE — Care Management Important Message (Signed)
Important Message  Patient Details  Name: Olivia Ross MRN: [id number] Date of Birth: [birth year]   Medicare Important Message Given:  Yes     Orbie Pyo 02/05/2022, 2:25 PM

## 2022-02-05 NOTE — Progress Notes (Signed)
STROKE TEAM PROGRESS NOTE   INTERVAL HISTORY Patient is sitting up comfortably in bed.  Olivia Ross has no complaints.  Echocardiogram showed ejection fraction 60 to 65%.  No family at the bedside.  Olivia Ross remains slightly confused and disoriented which is her baseline Vitals:   02/04/22 1938 02/04/22 2340 02/05/22 0400 02/05/22 1153  BP: (!) 154/64 (!) 152/86 (!) 163/64 (!) 151/62  Pulse: 66 64 65 75  Resp: '16 16 18 20  '$ Temp: 98 F (36.7 C) 98.2 F (36.8 C) 97.7 F (36.5 C) 98.1 F (36.7 C)  TempSrc: Oral  Axillary Oral  SpO2: 100% 100% 98% 100%  Weight:       CBC:  Recent Labs  Lab 02/02/22 2120 02/02/22 2145  WBC 11.6*  --   NEUTROABS 8.2*  --   HGB 12.3 12.9  HCT 37.4 38.0  MCV 97.1  --   PLT 143*  --    Basic Metabolic Panel:  Recent Labs  Lab 02/02/22 2120 02/02/22 2145  NA 141 143  K 3.8 4.1  CL 110 108  CO2 23  --   GLUCOSE 89 85  BUN 16 18  CREATININE 0.71 0.70  CALCIUM 9.7  --    Lipid Panel:  Recent Labs  Lab 02/04/22 0320  CHOL 159  TRIG 49  HDL 51  CHOLHDL 3.1  VLDL 10  LDLCALC 98   HgbA1c:  Recent Labs  Lab 02/02/22 2136  HGBA1C 4.7*   Urine Drug Screen: No results for input(s): "LABOPIA", "COCAINSCRNUR", "LABBENZ", "AMPHETMU", "THCU", "LABBARB" in the last 168 hours.  Alcohol Level  Recent Labs  Lab 02/02/22 2136  ETH <10   IMAGING past 24 hours ECHOCARDIOGRAM COMPLETE  Result Date: 02/04/2022    ECHOCARDIOGRAM REPORT   Patient Name:   Olivia Ross Date of Exam: 02/04/2022 Medical Rec #:  [id number]      Height:       65.0 in Accession #:    [id number]     Weight:       105.6 lb Date of Birth:  Aug 18, 1945      BSA:          1.508 m Patient Age:    76 years       BP:           176/71 mmHg Patient Gender: F              HR:           61 bpm. Exam Location:  Inpatient Procedure: 2D Echo, Cardiac Doppler and Color Doppler Indications:    Stroke I63.9  History:        Patient has no prior history of Echocardiogram examinations.                  Stroke, Arrythmias:Bradycardia; Risk Factors:Hypertension and                 Dyslipidemia.  Sonographer:    Ronny Flurry Referring Phys: [id number] [place]  1. Left ventricular ejection fraction, by estimation, is 60 to 65%. The left ventricle has normal function. The left ventricle has no regional wall motion abnormalities. There is mild asymmetric left ventricular hypertrophy of the basal-septal segment. Left ventricular diastolic parameters are consistent with Grade II diastolic dysfunction (pseudonormalization). Elevated left atrial pressure.  2. Right ventricular systolic function is normal. The right ventricular size is normal. There is normal pulmonary artery systolic pressure. The estimated right ventricular systolic pressure is 70.3 mmHg.  3.  Left atrial size was mildly dilated.  4. The mitral valve is abnormal. Trivial mitral valve regurgitation. No evidence of mitral stenosis. Moderate mitral annular calcification.  5. The aortic valve is tricuspid. Aortic valve regurgitation is not visualized. No aortic stenosis is present.  6. The inferior vena cava is normal in size with greater than 50% respiratory variability, suggesting right atrial pressure of 3 mmHg. FINDINGS  Left Ventricle: Left ventricular ejection fraction, by estimation, is 60 to 65%. The left ventricle has normal function. The left ventricle has no regional wall motion abnormalities. The left ventricular internal cavity size was normal in size. There is  mild asymmetric left ventricular hypertrophy of the basal-septal segment. Left ventricular diastolic parameters are consistent with Grade II diastolic dysfunction (pseudonormalization). Elevated left atrial pressure. Right Ventricle: The right ventricular size is normal. No increase in right ventricular wall thickness. Right ventricular systolic function is normal. There is normal pulmonary artery systolic pressure. The tricuspid regurgitant velocity is 2.09 m/s, and  with  an assumed right atrial pressure of 3 mmHg, the estimated right ventricular systolic pressure is 13.0 mmHg. Left Atrium: Left atrial size was mildly dilated. Right Atrium: Right atrial size was normal in size. Pericardium: There is no evidence of pericardial effusion. Mitral Valve: The mitral valve is abnormal. Moderate mitral annular calcification. Trivial mitral valve regurgitation. No evidence of mitral valve stenosis. Tricuspid Valve: The tricuspid valve is normal in structure. Tricuspid valve regurgitation is trivial. Aortic Valve: The aortic valve is tricuspid. Aortic valve regurgitation is not visualized. No aortic stenosis is present. Aortic valve mean gradient measures 2.0 mmHg. Aortic valve peak gradient measures 3.9 mmHg. Aortic valve area, by VTI measures 1.51 cm. Pulmonic Valve: The pulmonic valve was not well visualized. Pulmonic valve regurgitation is not visualized. Aorta: The aortic root is normal in size and structure. Venous: The inferior vena cava is normal in size with greater than 50% respiratory variability, suggesting right atrial pressure of 3 mmHg. IAS/Shunts: The interatrial septum was not well visualized.  LEFT VENTRICLE PLAX 2D LVIDd:         3.75 cm     Diastology LVIDs:         3.10 cm     LV e' medial:    4.68 cm/s LV PW:         0.95 cm     LV E/e' medial:  16.5 LV IVS:        0.90 cm     LV e' lateral:   5.55 cm/s LVOT diam:     1.55 cm     LV E/e' lateral: 13.9 LV SV:         33 LV SV Index:   22 LVOT Area:     1.89 cm                             3D Volume EF: LV Volumes (MOD)           3D EF:        59 % LV vol d, MOD A2C: 89.0 ml LV EDV:       87 ml LV vol d, MOD A4C: 56.0 ml LV ESV:       36 ml LV vol s, MOD A2C: 32.2 ml LV SV:        52 ml LV vol s, MOD A4C: 20.6 ml LV SV MOD A2C:     56.8 ml LV SV MOD  A4C:     56.0 ml LV SV MOD BP:      48.4 ml RIGHT VENTRICLE             IVC RV Basal diam:  2.60 cm     IVC diam: 1.40 cm RV S prime:     10.70 cm/s TAPSE (M-mode): 3.0 cm LEFT  ATRIUM             Index        RIGHT ATRIUM           Index LA diam:        4.10 cm 2.72 cm/m   RA Area:     10.50 cm LA Vol (A2C):   42.6 ml 28.25 ml/m  RA Volume:   20.50 ml  13.59 ml/m LA Vol (A4C):   62.5 ml 41.45 ml/m LA Biplane Vol: 52.0 ml 34.48 ml/m  AORTIC VALVE AV Area (Vmax):    1.53 cm AV Area (Vmean):   1.40 cm AV Area (VTI):     1.51 cm AV Vmax:           99.35 cm/s AV Vmean:          68.250 cm/s AV VTI:            0.220 m AV Peak Grad:      3.9 mmHg AV Mean Grad:      2.0 mmHg LVOT Vmax:         80.60 cm/s LVOT Vmean:        50.600 cm/s LVOT VTI:          0.176 m LVOT/AV VTI ratio: 0.80  AORTA Ao Root diam: 2.90 cm MITRAL VALVE               TRICUSPID VALVE MV Area (PHT): 3.21 cm    TR Peak grad:   17.5 mmHg MV Decel Time: 236 msec    TR Vmax:        209.00 cm/s MV E velocity: 77.10 cm/s MV A velocity: 81.90 cm/s  SHUNTS MV E/A ratio:  0.94        Systemic VTI:  0.18 m                            Systemic Diam: 1.55 cm Oswaldo Milian MD Electronically signed by Oswaldo Milian MD Signature Date/Time: 02/04/2022/4:32:50 PM    Final     PHYSICAL EXAM  Physical Exam  Constitutional: Appears thin, in no acute distress.   Psych: Patient is confused but cooperates intermittently with exam  Eyes: No scleral injection HENT: No OP obstrucion MSK: no joint deformities or swelling noted Cardiovascular: Normal rate and regular rhythm on telemetry Respiratory: Effort normal, non-labored breathing on room air  GI: Soft.  No distension. There is no tenderness.  Skin: WDI  Neuro: Mental Status: Patient is awake, alert, oriented to self and place. Olivia Ross is unable to correctly state her age, Olivia Ross incorrectly states that Olivia Ross is 76 years old. Olivia Ross does not correctly state the month and is unable to provide a clear or coherent history of present illness.  Olivia Ross intermittently follows simple commands and often needs multiple repeat instruction. At times, Olivia Ross does not follow commands at all.  Poor attention and concentration.  Cranial Nerves: II: Pupils are equal, round, and reactive to light.  III,IV, VI: Olivia Ross tracks throughout her visual fields. V: Facial sensation is symmetric to light touch  VII: Facial  movement is symmetric resting and grimacing VIII: Hearing is intact to loud voice X: No hypophonia noted  XI: Shoulder shrug is symmetric. XII: Tongue protrudes midline  Motor: Tone is normal. Spontaneous movement throughout. Olivia Ross is able to elevate bilateral lower extremities antigravity but loses attention and drops them. Olivia Ross does not elevate bilateral lower extremities to command but will briskly withdraw to noxious stimuli and state "ouch"  Sensory: As above.  Cerebellar: Does not perform   ASSESSMENT/PLAN Olivia Ross is a 76 y.o. 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 Initial evaluation was concerning for left lower extremity weakness and confusion. Patient was not given TNK as Olivia Ross presented outside of the thrombolytic therapy time window. Initial neurologist noted the patient to be in atrial fibrillation on arrival though there are no telemetry strips or EKGs supporting atrial fibrillation for review.   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 CTH: 1. Small area of evolving cytotoxic edema at the posterior left frontal region, consistent with an evolving acute left MCA distribution infarct, stable from prior MRI. No associated hemorrhage or mass effect. 2. No other new acute intracranial abnormality. 3. Atrophy with extensive chronic ischemic changes,  stable. MRA motion degraded, negative for acute LVO, focal severe proximal right M1 stenosis, with scant attenuated flow seen distally within the right MCA distribution. Finding is in keeping with the large remote right MCA territory infarction. Suspected severe proximal left M2 stenosis. 2 mm outpouching extending anteriorly from the left P1/P2 junction, favored to reflext a small left PCOM remnant.  Carotid Doppler  1-39% stenosis within the left and right ICA 2D Echo pending  LDL 98 HgbA1c 4.7 VTE prophylaxis - Lovenox SQ    Diet   Diet regular Room service appropriate? Yes with Assist; Fluid consistency: Thin   No antithrombotic prior to admission, now on aspirin 81 mg daily and clopidogrel 75 mg daily. DAPT for 21 days with ASA and clopidogrel followed by ASA 81 mg monotherapy unless atrial fibrillation is confirmed.  Therapy recommendations:  Home health OT, pending PT evaluation  Disposition:  pending   History of right MCA CVA 2022 Imaging reveals chronic, multiple remote CVAs in April 2022 No neurology records for review regarding history of stroke evaluation or treatment   Previous syncopal episode in May of 2023, possibly concerning for seizure (unwitnessed onset) EEG 08/27/2021 without seizures or epileptiform discharges. EEG with cortical dysfunction from left temporal and right posterior quadrant, likely secondary to underlying strokes.   Hypertension Home meds:  Amlodipine Poorly controlled Permissive hypertension (OK if < 220/120) but gradually normalize in 5-7 days Long-term BP goal normotensive  Hyperlipidemia Home meds:  atorvastatin, resumed in hospital LDL 98, goal < 70 Add Zetia 10 mg daily Continue high intensity statin at discharge  Other Stroke Risk Factors Advanced Age >/= 86  Hx stroke/TIA Family hx stroke (sister)  Other Active Problems Vascular dementia  Breast cancer s/p mastectomy 2021 and hormone therapy   Hospital day # 2  Patient presented  with confusion and left leg weakness and MRI shows a small punctate left parietal infarct which cannot explain her weakness which is likely  of embolic etiology.  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  Neurological exam reveals confusion and disorientation which may be baseline, dementia but otherwise no focal weakness.  Patient may need prolonged cardiac monitoring at discharge to look for paroxysmal A-fib.  Recommend aspirin and Plavix for 3 weeks followed by aspirin alone and aggressive risk factor modification.   Discussed with Dr. Avon Gully.  Greater than 50% time during this 35-minute visit was spent in counseling and coordination of care about her embolic stroke and discussion about evaluation and treatment and prevention and answering questions.  Antony Contras, MD Medical Director [facility] Pager: [phone] 02/05/2022 2:13 PM  To contact Stroke Continuity provider, please refer to [email or web address] After hours, contact General Neurology

## 2022-02-06 ENCOUNTER — Encounter (HOSPITAL_COMMUNITY): Payer: Self-pay | Admitting: Radiology

## 2022-02-06 ENCOUNTER — Emergency Department (HOSPITAL_COMMUNITY)
Admission: EM | Admit: 2022-02-06 | Discharge: 2022-02-07 | Disposition: A | Discharge status: Home / Self Care | Payer: Medicare PPO | Attending: Emergency Medicine | Admitting: Emergency Medicine

## 2022-02-06 ENCOUNTER — Other Ambulatory Visit: Payer: Self-pay

## 2022-02-06 DIAGNOSIS — Z853 Personal history of malignant neoplasm of breast: Secondary | ICD-10-CM | POA: Diagnosis not present

## 2022-02-06 DIAGNOSIS — Z79899 Other long term (current) drug therapy: Secondary | ICD-10-CM | POA: Insufficient documentation

## 2022-02-06 DIAGNOSIS — R4182 Altered mental status, unspecified: Secondary | ICD-10-CM | POA: Diagnosis present

## 2022-02-06 DIAGNOSIS — Z7982 Long term (current) use of aspirin: Secondary | ICD-10-CM | POA: Insufficient documentation

## 2022-02-06 DIAGNOSIS — G40909 Epilepsy, unspecified, not intractable, without status epilepticus: Secondary | ICD-10-CM

## 2022-02-06 DIAGNOSIS — I69898 Other sequelae of other cerebrovascular disease: Secondary | ICD-10-CM | POA: Insufficient documentation

## 2022-02-06 DIAGNOSIS — Z7902 Long term (current) use of antithrombotics/antiplatelets: Secondary | ICD-10-CM | POA: Insufficient documentation

## 2022-02-06 DIAGNOSIS — F0390 Unspecified dementia without behavioral disturbance: Secondary | ICD-10-CM | POA: Insufficient documentation

## 2022-02-06 LAB — CBC
HCT: 32.4 % — ABNORMAL LOW (ref 36.0–46.0)
Hemoglobin: 10.5 g/dL — ABNORMAL LOW (ref 12.0–15.0)
MCH: 32.3 pg (ref 26.0–34.0)
MCHC: 32.4 g/dL (ref 30.0–36.0)
MCV: 99.7 fL (ref 80.0–100.0)
Platelets: 117 10*3/uL — ABNORMAL LOW (ref 150–400)
RBC: 3.25 MIL/uL — ABNORMAL LOW (ref 3.87–5.11)
RDW: 14.1 % (ref 11.5–15.5)
WBC: 9.3 10*3/uL (ref 4.0–10.5)
nRBC: 0 % (ref 0.0–0.2)

## 2022-02-06 MED ORDER — LEVETIRACETAM 250 MG PO TABS: 250.0000 mg | ORAL_TABLET | Freq: Two times a day (BID) | ORAL | 1 refills | Status: DC

## 2022-02-06 MED ORDER — LEVETIRACETAM 250 MG PO TABS
250.0000 mg | ORAL_TABLET | Freq: Once | ORAL | Status: AC
  Administered 2022-02-06: 250 mg via ORAL
  Filled 2022-02-06: qty 1

## 2022-02-06 NOTE — ED Triage Notes (Signed)
Pt was discharge from hospital yesterday. Daughter said that she was eating and then for a moment just stopped and starred off into space for about 5 to 10 minutes. PT daughter did not that her foot twitched a bit. Pt initially didn't talk with EMS but after a few minutes started talking normal again. Pt very talkative on arrival.

## 2022-02-06 NOTE — ED Provider Notes (Signed)
[facility] EMERGENCY DEPARTMENT Provider Note   CSN: [id number] Arrival date & time: 02/06/22  2158     History {Add pertinent medical, surgical, social history, OB history to HPI:1} Chief Complaint  Patient presents with   Altered Mental Status    Olivia Ross is a 76 y.o. female.  The history is provided by the patient, medical records and the EMS personnel. No language interpreter was used.  Altered Mental Status    This is a 76 year old female significant history of breast cancer, vascular dementia, seizures, prior CVA, recently discharged home yesterday after being admitted due to altered mental status and slurred speech with difficulty 3 word finding and questionable right arm weakness.  She was diagnosed with acute left parietal cortex infarct.  She also had questionable syncopal episode concerning for seizure that are likely secondary to underlying stroke per neurology.  According to EMS note, today patient was eating and then she just stop and stare off into space for approximately 5 to 10 minutes.  Daughter also noticed that her foot twitches for a bit.  When EMS first arrived, patient was not communicating however after few minutes she started to talk normal again.  I was able to communicate with patient and she does not know why she is here and unable to give me any additional information.  Level 5 caveat due to deficits due to recently diagnosed acute stroke.  Home Medications Prior to Admission medications   Medication Sig Start Date End Date Taking? Authorizing Provider  acetaminophen (TYLENOL) 500 MG tablet Take 500 mg by mouth every 6 (six) hours as needed for moderate pain.    [provider]  amLODipine (NORVASC) 5 MG tablet Take 1 tablet (5 mg total) by mouth daily. 08/28/21   Shelly Coss, MD  aspirin EC 81 MG tablet Take 1 tablet (81 mg total) by mouth daily. Swallow whole. 02/05/22 02/05/23  Little Ishikawa, MD  atorvastatin  (LIPITOR) 80 MG tablet Take 1 tablet (80 mg total) by mouth daily. 08/29/21   Shelly Coss, MD  carboxymethylcellulose (REFRESH PLUS) 0.5 % SOLN Place 1 drop into both eyes 3 (three) times daily as needed (dry eyes).    [provider]  clopidogrel (PLAVIX) 75 MG tablet Take 1 tablet (75 mg total) by mouth daily for 21 days. 02/05/22 02/26/22  Little Ishikawa, MD  dicyclomine (BENTYL) 10 MG capsule Take 10 mg by mouth daily as needed for spasms. 07/24/20   [provider]  ezetimibe (ZETIA) 10 MG tablet Take 1 tablet (10 mg total) by mouth daily. 02/06/22   Little Ishikawa, MD  letrozole [facility]) 2.5 MG tablet Take 2.5 mg by mouth daily. 12/12/20   [provider]  loperamide (IMODIUM A-D) 2 MG tablet Take 2 mg by mouth 4 (four) times daily as needed for diarrhea or loose stools.    [provider]  magnesium oxide (MAG-OX) 400 (240 Mg) MG tablet Take 0.5 tablets (200 mg total) by mouth 2 (two) times daily. 12/22/20   Regalado, Belkys A, MD  Multiple Vitamin (MULTIVITAMIN WITH MINERALS) TABS tablet Take 1 tablet by mouth daily. Centrum    [provider]  valsartan (DIOVAN) 160 MG tablet Take 160 mg by mouth daily. 12/12/20   [provider]  vitamin B-12 100 MCG tablet Take 1 tablet (100 mcg total) by mouth daily. 12/23/20   Regalado, Cassie Freer, MD      Allergies    Diphth-acell pertussis-tetanus, Dtap-hepatitis b recomb-ipv,  Norco [hydrocodone-acetaminophen], and Percocet [oxycodone-acetaminophen]    Review of Systems   Review of Systems  All other systems reviewed and are negative.   Physical Exam Updated Vital Signs BP (!) 151/73   Pulse (!) 59   Resp 19   Ht '5\' 5"'$  (1.651 m)   Wt 47.6 kg   SpO2 100%   BMI 17.47 kg/m  Physical Exam Vitals and nursing note reviewed.  Constitutional:      General: She is not in acute distress.    Appearance: She is well-developed.  HENT:     Head: Normocephalic and atraumatic.  Eyes:      Conjunctiva/sclera: Conjunctivae normal.     Pupils: Pupils are equal, round, and reactive to light.  Cardiovascular:     Rate and Rhythm: Normal rate and regular rhythm.     Pulses: Normal pulses.     Heart sounds: Normal heart sounds.  Pulmonary:     Effort: Pulmonary effort is normal.  Abdominal:     Tenderness: There is no abdominal tenderness.     Comments: Abdominal bruit heard  Musculoskeletal:     Cervical back: Normal range of motion and neck supple.  Skin:    Findings: No rash.  Neurological:     Mental Status: She is alert. She is disoriented.     GCS: GCS eye subscore is 4. GCS verbal subscore is 4. GCS motor subscore is 5.     Cranial Nerves: No dysarthria or facial asymmetry.     Sensory: Sensation is intact.  Psychiatric:        Mood and Affect: Mood normal.     ED Results / Procedures / Treatments   Labs (all labs ordered are listed, but only abnormal results are displayed) Labs Reviewed - No data to display  EKG None  Radiology No results found.  Procedures Procedures  {Document cardiac monitor, telemetry assessment procedure when appropriate:1}  Medications Ordered in ED Medications - No data to display  ED Course/ Medical Decision Making/ A&P                           Medical Decision Making Amount and/or Complexity of Data Reviewed Labs: ordered. Radiology: ordered.  Risk Prescription drug management.   BP (!) 151/73   Pulse (!) 59   Temp 98.4 F (36.9 C) (Oral)   Resp 19   Ht '5\' 5"'$  (1.651 m)   Wt 47.6 kg   SpO2 100%   BMI 17.47 kg/m   10:25 PM This is a 76 year old female significant history of breast cancer, vascular dementia, seizures, prior CVA, recently discharged home yesterday after being admitted due to altered mental status and slurred speech with difficulty 3 word finding and questionable right arm weakness.  She was diagnosed with acute left parietal cortex infarct.  She also had questionable syncopal episode  concerning for seizure that are likely secondary to underlying stroke per neurology.  According to EMS note, today patient was eating and then she just stop and stare off into space for approximately 5 to 10 minutes.  Daughter also noticed that her foot twitches for a bit.  When EMS first arrived, patient was not communicating however after few minutes she started to talk normal again.  I was able to communicate with patient and she does not know why she is here and unable to give me any additional information.  Level 5 caveat due to deficits due to recently diagnosed acute stroke.  On exam, patient is laying in bed appears to be in no acute discomfort.  She is conversant, talking and denies having any active problems.  However, she is alert and oriented x1.  She thought the month is January instead of November, the year is 1997 instead of 2023, and the current president is Tawni Pummel instead of [place].  She also thought her age is 78 instead of 65.  She does not follow verbal command when asked.   Pt has sxs suggestive of seizure likely in the setting of recent L parietal cortex infarct for which she was admitted and discharged home yesterday.  She is not on any AED.  She is on dual antiplatelet therapy for her stroke. She does have dementia.    I appreciate consultation from on-call neurologist, Dr. Curly Shores, who recommended to start patient on Keppra 250 mg twice daily and have patient follow-up outpatient for further management of her condition.  Otherwise I felt patient is at her baseline in regards to her dementia.  -Labs ordered, independently viewed and interpreted by me.  Labs remarkable for *** -The patient was maintained on a cardiac monitor.  I personally viewed and interpreted the cardiac monitored which showed an underlying rhythm of: *** -Imaging independently viewed and interpreted by me and I agree with radiologist's interpretation.  Result remarkable for *** -This patient presents to the ED for  concern of ***, this involves an extensive number of treatment options, and is a complaint that carries with it a high risk of complications and morbidity.  The differential diagnosis includes *** -Co morbidities that complicate the patient evaluation includes *** -Treatment includes *** -Reevaluation of the patient after these medicines showed that the patient {resolved/improved/worsened:23923::"improved"} -PCP office notes or outside notes reviewed -Discussion with specialist *** -Escalation to admission/observation considered: patients feels much better, is comfortable with discharge, and will follow up with PCP -Prescription medication considered, patient comfortable with *** -Social Determinant of Health considered which includes ***   {Document critical care time when appropriate:1} {Document review of labs and clinical decision tools ie heart score, Chads2Vasc2 etc:1}  {Document your independent review of radiology images, and any outside records:1} {Document your discussion with family members, caretakers, and with consultants:1} {Document social determinants of health affecting pt's care:1} {Document your decision making why or why not admission, treatments were needed:1} Final Clinical Impression(s) / ED Diagnoses Final diagnoses:  None    Rx / DC Orders ED Discharge Orders     None

## 2022-02-06 NOTE — Plan of Care (Signed)
Patient recently discharged from the hospital with dementia without behavioral disturbance and recent left MCA stroke as well as chronic right MCA territory stroke.  Spell as described is concerning for focal seizure given associated foot twitching, and patient is at risk of seizures given her stroke history as well as her dementia.  She has already had EEG which was technically limited but negative.  She is back to baseline at this time per EDP with no other complaints or concerns   - Head CT to confirm no acute intracranial process, if negative may be discharged with keppra and close outpatient neurological follow-up - Keppra 250 mg BID Estimated Creatinine Clearance: 43.6 mL/min (by C-G formula based on SCr of 0.7 mg/dL).   CrCl 80 to 130 mL/minute/1.73 m2: 500 mg to 1.5 g every 12 hours.  CrCl 50 to <80 mL/minute/1.73 m2: 500 mg to 1 g every 12 hours.  CrCl 30 to <50 mL/minute/1.73 m2: 250 to 750 mg every 12 hours.  CrCl 15 to <30 mL/minute/1.73 m2: 250 to 500 mg every 12 hours.  CrCl <15 mL/minute/1.73 m2: 250 to 500 mg every 24 hours (expert opinion). - Close outpatient neurology follow-up  Please do reach out if full neurological consultation would be helpful, these are curbside recommendations based on brief review of chart and conversation with ED provider

## 2022-02-06 NOTE — Discharge Instructions (Addendum)
You have seizure-like activity is likely due to recent stroke.  Please take Keppra as prescribed and follow-up closely with your neurologist for further care.

## 2022-02-06 NOTE — ED Provider Notes (Incomplete)
[place] EMERGENCY DEPARTMENT Provider Note   CSN: [id number] Arrival date & time: 02/06/22  2158     History {Add pertinent medical, surgical, social history, OB history to HPI:1} Chief Complaint  Patient presents with  . Altered Mental Status    Taneal Atchley is a 76 y.o. female.  The history is provided by the patient, medical records and the EMS personnel. No language interpreter was used.  Altered Mental Status    This is a 76 year old female significant history of breast cancer, vascular dementia, seizures, prior CVA, recently discharged home yesterday after being admitted due to altered mental status and slurred speech with difficulty 3 word finding and questionable right arm weakness.  She was diagnosed with acute left parietal cortex infarct.  She also had questionable syncopal episode concerning for seizure that are likely secondary to underlying stroke per neurology.  According to EMS note, today patient was eating and then she just stop and stare off into space for approximately 5 to 10 minutes.  Daughter also noticed that her foot twitches for a bit.  When EMS first arrived, patient was not communicating however after few minutes she started to talk normal again.  I was able to communicate with patient and she does not know why she is here and unable to give me any additional information.  Level 5 caveat due to deficits due to recently diagnosed acute stroke.  Home Medications Prior to Admission medications   Medication Sig Start Date End Date Taking? Authorizing Provider  acetaminophen (TYLENOL) 500 MG tablet Take 500 mg by mouth every 6 (six) hours as needed for moderate pain.    [provider]  amLODipine (NORVASC) 5 MG tablet Take 1 tablet (5 mg total) by mouth daily. 08/28/21   Shelly Coss, MD  aspirin EC 81 MG tablet Take 1 tablet (81 mg total) by mouth daily. Swallow whole. 02/05/22 02/05/23  Little Ishikawa, MD  atorvastatin  (LIPITOR) 80 MG tablet Take 1 tablet (80 mg total) by mouth daily. 08/29/21   Shelly Coss, MD  carboxymethylcellulose (REFRESH PLUS) 0.5 % SOLN Place 1 drop into both eyes 3 (three) times daily as needed (dry eyes).    [provider]  clopidogrel (PLAVIX) 75 MG tablet Take 1 tablet (75 mg total) by mouth daily for 21 days. 02/05/22 02/26/22  Little Ishikawa, MD  dicyclomine (BENTYL) 10 MG capsule Take 10 mg by mouth daily as needed for spasms. 07/24/20   [provider]  ezetimibe (ZETIA) 10 MG tablet Take 1 tablet (10 mg total) by mouth daily. 02/06/22   Little Ishikawa, MD  letrozole [facility]) 2.5 MG tablet Take 2.5 mg by mouth daily. 12/12/20   [provider]  loperamide (IMODIUM A-D) 2 MG tablet Take 2 mg by mouth 4 (four) times daily as needed for diarrhea or loose stools.    [provider]  magnesium oxide (MAG-OX) 400 (240 Mg) MG tablet Take 0.5 tablets (200 mg total) by mouth 2 (two) times daily. 12/22/20   Regalado, Belkys A, MD  Multiple Vitamin (MULTIVITAMIN WITH MINERALS) TABS tablet Take 1 tablet by mouth daily. Centrum    [provider]  valsartan (DIOVAN) 160 MG tablet Take 160 mg by mouth daily. 12/12/20   [provider]  vitamin B-12 100 MCG tablet Take 1 tablet (100 mcg total) by mouth daily. 12/23/20   Regalado, Cassie Freer, MD      Allergies    Diphth-acell pertussis-tetanus, Dtap-hepatitis b recomb-ipv,  Norco [hydrocodone-acetaminophen], and Percocet [oxycodone-acetaminophen]    Review of Systems   Review of Systems  All other systems reviewed and are negative.   Physical Exam Updated Vital Signs BP (!) 151/73   Pulse (!) 59   Resp 19   Ht '5\' 5"'$  (1.651 m)   Wt 47.6 kg   SpO2 100%   BMI 17.47 kg/m  Physical Exam Vitals and nursing note reviewed.  Constitutional:      General: She is not in acute distress.    Appearance: She is well-developed.  HENT:     Head: Normocephalic and atraumatic.  Eyes:      Conjunctiva/sclera: Conjunctivae normal.     Pupils: Pupils are equal, round, and reactive to light.  Cardiovascular:     Rate and Rhythm: Normal rate and regular rhythm.     Pulses: Normal pulses.     Heart sounds: Normal heart sounds.  Pulmonary:     Effort: Pulmonary effort is normal.  Abdominal:     Tenderness: There is no abdominal tenderness.     Comments: Abdominal bruit heard  Musculoskeletal:     Cervical back: Normal range of motion and neck supple.  Skin:    Findings: No rash.  Neurological:     Mental Status: She is alert. She is disoriented.     GCS: GCS eye subscore is 4. GCS verbal subscore is 4. GCS motor subscore is 5.     Cranial Nerves: No dysarthria or facial asymmetry.     Sensory: Sensation is intact.  Psychiatric:        Mood and Affect: Mood normal.     ED Results / Procedures / Treatments   Labs (all labs ordered are listed, but only abnormal results are displayed) Labs Reviewed  CBC - Abnormal; Notable for the following components:      Result Value   RBC 3.25 (*)    Hemoglobin 10.5 (*)    HCT 32.4 (*)    Platelets 117 (*)    All other components within normal limits  RAPID URINE DRUG SCREEN, HOSP PERFORMED  COMPREHENSIVE METABOLIC PANEL  URINALYSIS, ROUTINE W REFLEX MICROSCOPIC    EKG None  Radiology No results found.  Procedures Procedures  {Document cardiac monitor, telemetry assessment procedure when appropriate:1}  Medications Ordered in ED Medications - No data to display  ED Course/ Medical Decision Making/ A&P                           Medical Decision Making Amount and/or Complexity of Data Reviewed Labs: ordered. Radiology: ordered.  Risk Prescription drug management.   BP (!) 151/73   Pulse (!) 59   Temp 98.4 F (36.9 C) (Oral)   Resp 19   Ht '5\' 5"'$  (1.651 m)   Wt 47.6 kg   SpO2 100%   BMI 17.47 kg/m   10:25 PM This is a 76 year old female significant history of breast cancer, vascular dementia,  seizures, prior CVA, recently discharged home yesterday after being admitted due to altered mental status and slurred speech with difficulty 3 word finding and questionable right arm weakness.  She was diagnosed with acute left parietal cortex infarct.  She also had questionable syncopal episode concerning for seizure that are likely secondary to underlying stroke per neurology.  According to EMS note, today patient was eating and then she just stop and stare off into space for approximately 5 to 10 minutes.  Daughter also noticed that her  foot twitches for a bit.  When EMS first arrived, patient was not communicating however after few minutes she started to talk normal again.  I was able to communicate with patient and she does not know why she is here and unable to give me any additional information.  Level 5 caveat due to deficits due to recently diagnosed acute stroke.  On exam, patient is laying in bed appears to be in no acute discomfort.  She is conversant, talking and denies having any active problems.  However, she is alert and oriented x1.  She thought the month is January instead of November, the year is 1997 instead of 2023, and the current president is Tawni Pummel instead of [place].  She also thought her age is 32 instead of 70.  She does not follow verbal command when asked.   Pt has sxs suggestive of seizure likely in the setting of recent L parietal cortex infarct for which she was admitted and discharged home yesterday.  She is not on any AED.  She is on dual antiplatelet therapy for her stroke. She does have dementia.    I appreciate consultation from on-call neurologist, Dr. Curly Shores, who recommended to start patient on Keppra 250 mg twice daily and have patient follow-up outpatient for further management of her condition.  Otherwise I felt patient is at her baseline in regards to her dementia.  -Labs ordered, independently viewed and interpreted by me.  Labs remarkable for *** -The patient was  maintained on a cardiac monitor.  I personally viewed and interpreted the cardiac monitored which showed an underlying rhythm of: *** -Imaging independently viewed and interpreted by me and I agree with radiologist's interpretation.  Result remarkable for *** -This patient presents to the ED for concern of ***, this involves an extensive number of treatment options, and is a complaint that carries with it a high risk of complications and morbidity.  The differential diagnosis includes *** -Co morbidities that complicate the patient evaluation includes *** -Treatment includes *** -Reevaluation of the patient after these medicines showed that the patient {resolved/improved/worsened:23923::"improved"} -PCP office notes or outside notes reviewed -Discussion with specialist *** -Escalation to admission/observation considered: patients feels much better, is comfortable with discharge, and will follow up with PCP -Prescription medication considered, patient comfortable with *** -Social Determinant of Health considered which includes ***   {Document critical care time when appropriate:1} {Document review of labs and clinical decision tools ie heart score, Chads2Vasc2 etc:1}  {Document your independent review of radiology images, and any outside records:1} {Document your discussion with family members, caretakers, and with consultants:1} {Document social determinants of health affecting pt's care:1} {Document your decision making why or why not admission, treatments were needed:1} Final Clinical Impression(s) / ED Diagnoses Final diagnoses:  None    Rx / DC Orders ED Discharge Orders     None

## 2022-02-07 ENCOUNTER — Emergency Department (HOSPITAL_COMMUNITY): Payer: Medicare PPO

## 2022-02-07 LAB — I-STAT CHEM 8, ED
BUN: 18 mg/dL (ref 8–23)
Calcium, Ion: 1.21 mmol/L (ref 1.15–1.40)
Chloride: 106 mmol/L (ref 98–111)
Creatinine, Ser: 0.6 mg/dL (ref 0.44–1.00)
Glucose, Bld: 88 mg/dL (ref 70–99)
HCT: 37 % (ref 36.0–46.0)
Hemoglobin: 12.6 g/dL (ref 12.0–15.0)
Potassium: 4.1 mmol/L (ref 3.5–5.1)
Sodium: 139 mmol/L (ref 135–145)
TCO2: 25 mmol/L (ref 22–32)

## 2022-02-07 LAB — COMPREHENSIVE METABOLIC PANEL
ALT: 19 U/L (ref 0–44)
ALT: 37 U/L (ref 0–44)
AST: 20 U/L (ref 15–41)
AST: 40 U/L (ref 15–41)
Albumin: <1.5 g/dL — ABNORMAL LOW (ref 3.5–5.0)
Albumin: 3.1 g/dL — ABNORMAL LOW (ref 3.5–5.0)
Alkaline Phosphatase: 38 U/L (ref 38–126)
Alkaline Phosphatase: 88 U/L (ref 38–126)
Anion gap: 6 (ref 5–15)
Anion gap: 11 (ref 5–15)
BUN: 10 mg/dL (ref 8–23)
BUN: 16 mg/dL (ref 8–23)
CO2: 15 mmol/L — ABNORMAL LOW (ref 22–32)
CO2: 23 mmol/L (ref 22–32)
Calcium: 5 mg/dL — CL (ref 8.9–10.3)
Calcium: 10.2 mg/dL (ref 8.9–10.3)
Chloride: 125 mmol/L — ABNORMAL HIGH (ref 98–111)
Chloride: 106 mmol/L (ref 98–111)
Creatinine, Ser: 0.31 mg/dL — ABNORMAL LOW (ref 0.44–1.00)
Creatinine, Ser: 0.63 mg/dL (ref 0.44–1.00)
GFR, Estimated: >60 mL/min (ref >60)
GFR, Estimated: >60 mL/min (ref >60)
Glucose, Bld: 71 mg/dL (ref 70–99)
Glucose, Bld: 88 mg/dL (ref 70–99)
Potassium: 2.3 mmol/L — CL (ref 3.5–5.1)
Potassium: 4.4 mmol/L (ref 3.5–5.1)
Sodium: 146 mmol/L — ABNORMAL HIGH (ref 135–145)
Sodium: 140 mmol/L (ref 135–145)
Total Bilirubin: 0.4 mg/dL (ref 0.3–1.2)
Total Bilirubin: 0.7 mg/dL (ref 0.3–1.2)
Total Protein: <3 g/dL — ABNORMAL LOW (ref 6.5–8.1)
Total Protein: 5.9 g/dL — ABNORMAL LOW (ref 6.5–8.1)

## 2022-02-07 LAB — RAPID URINE DRUG SCREEN, HOSP PERFORMED
Amphetamines: NOT DETECTED
Barbiturates: NOT DETECTED
Benzodiazepines: NOT DETECTED
Cocaine: NOT DETECTED
Opiates: NOT DETECTED
Tetrahydrocannabinol: NOT DETECTED

## 2022-02-07 LAB — URINALYSIS, ROUTINE W REFLEX MICROSCOPIC
Bilirubin Urine: NEGATIVE
Glucose, UA: NEGATIVE mg/dL
Hgb urine dipstick: NEGATIVE
Ketones, ur: NEGATIVE mg/dL
Leukocytes,Ua: NEGATIVE
Nitrite: NEGATIVE
Protein, ur: NEGATIVE mg/dL
Specific Gravity, Urine: 1.013 (ref 1.005–1.030)
pH: 6 (ref 5.0–8.0)

## 2022-02-07 MED ORDER — CALCIUM GLUCONATE-NACL 1-0.675 GM/50ML-% IV SOLN
1.0000 g | Freq: Once | INTRAVENOUS | Status: AC
  Administered 2022-02-07: 1000 mg via INTRAVENOUS
  Filled 2022-02-07: qty 50

## 2022-02-07 MED ORDER — POTASSIUM CHLORIDE 10 MEQ/100ML IV SOLN
10.0000 meq | INTRAVENOUS | Status: AC
  Administered 2022-02-07: 10 meq via INTRAVENOUS
  Filled 2022-02-07: qty 100

## 2022-02-07 NOTE — ED Notes (Signed)
This RN called the pt daughter and notified her that Corey Harold will be on their way shortly

## 2022-02-07 NOTE — ED Notes (Signed)
Ptar called 

## 2023-05-30 IMAGING — CT CT HEAD W/O CM
4 series · 16 of 47 positions shown, 18 images · non-contrast
Comparison: CT 12/20/2020, 07/21/2020

CLINICAL DATA: Mental status change

EXAM:
CT HEAD WITHOUT CONTRAST
TECHNIQUE: Contiguous axial images were obtained from the base of the skull
through the vertex without intravenous contrast.

[Series 3: head without · axial · non-contrast · 0.45mm/px · z∈[+1131,+1231]mm · 7 of 28 slices shown, 9 images]
[im 4/28  brain]
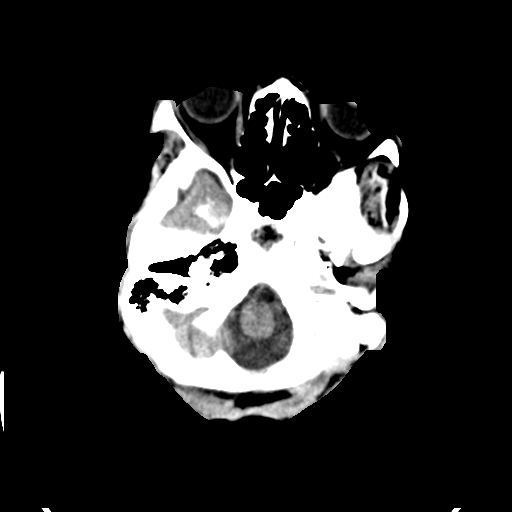
[im 4/28  bone]
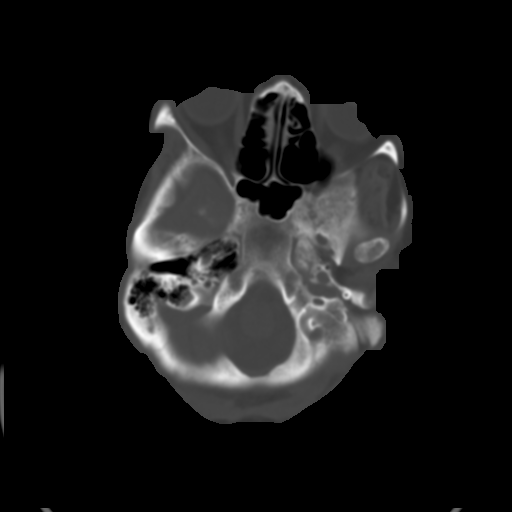
[im 7/28  brain]
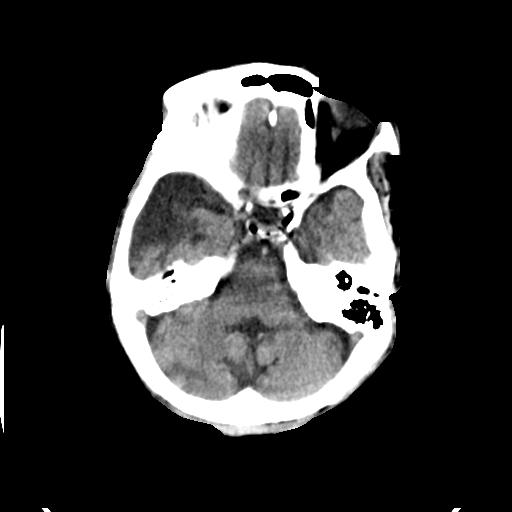
[im 11/28  brain]
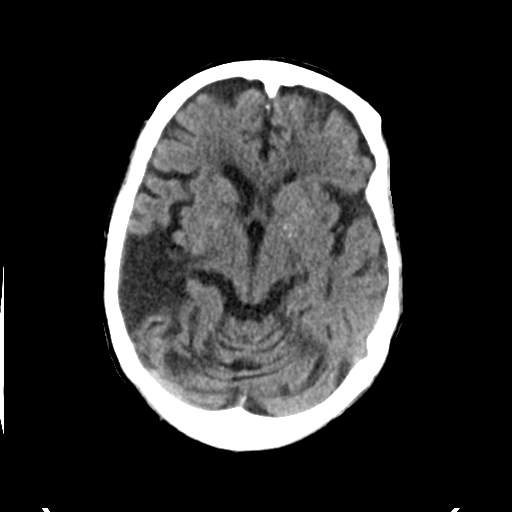
[im 14/28  brain]
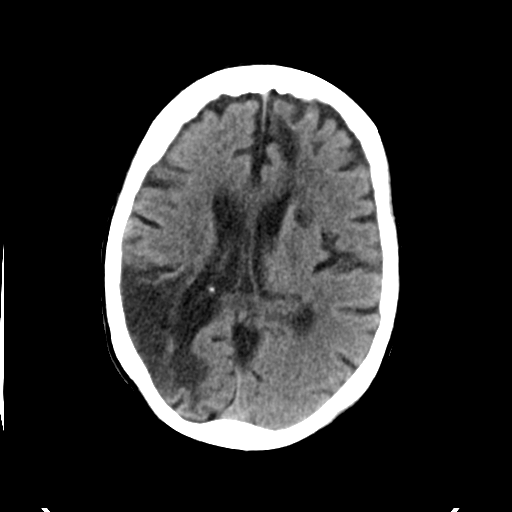
[im 17/28  brain]
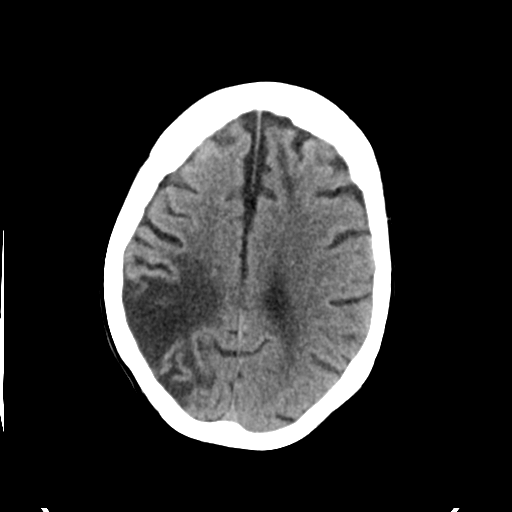
[im 17/28  bone]
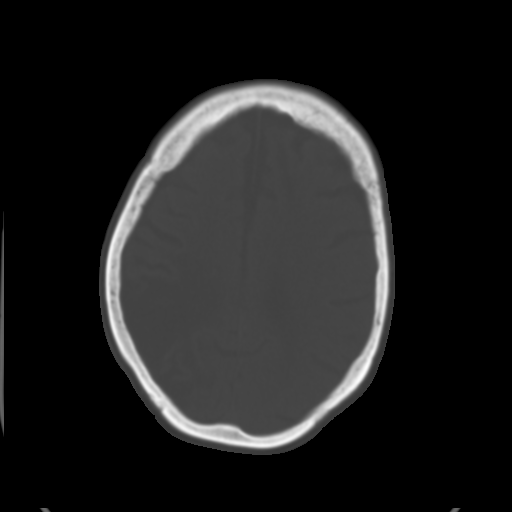
[im 21/28  brain]
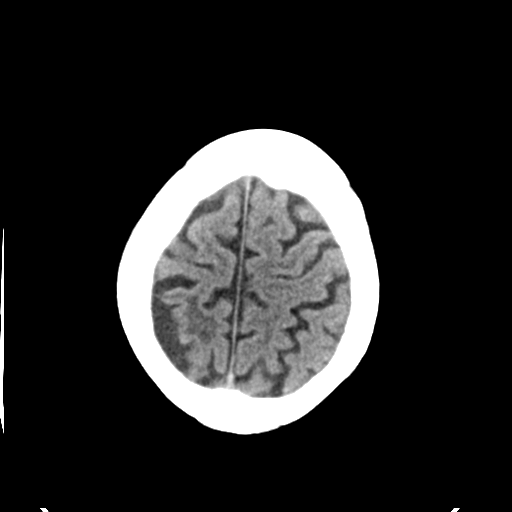
[im 24/28  brain]
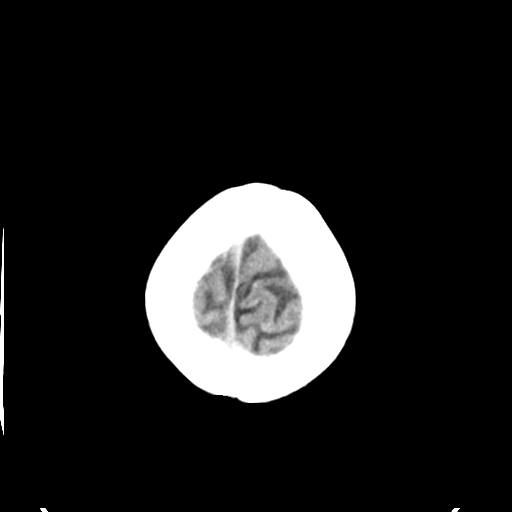

[Series 4: head bone · axial · 0.45mm/px · z∈[+1128,+1156]mm · 3 of 69 slices shown]
[im 7/69  bone]
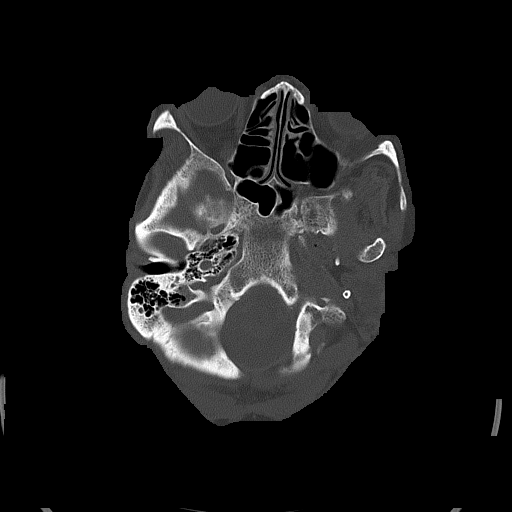
[im 14/69  bone]
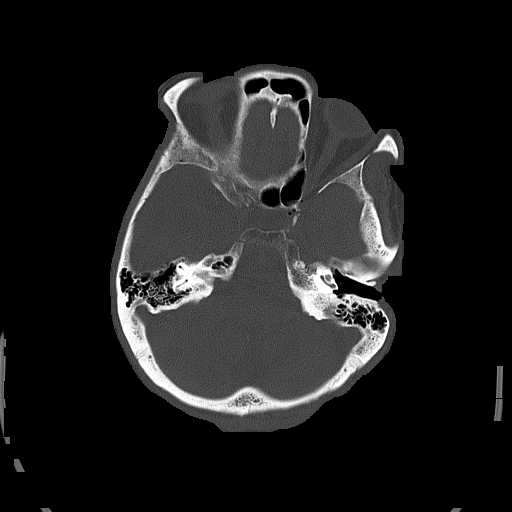
[im 21/69  bone]
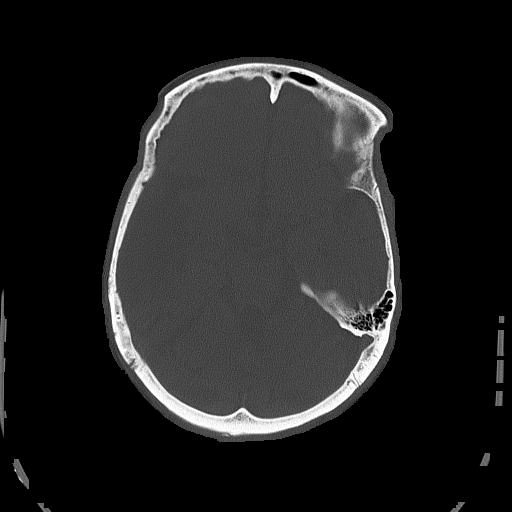

[Series 5: head without cor · coronal · non-contrast · 0.30mm/px · 3 of 57 slices shown]
[im 19/57  brain]
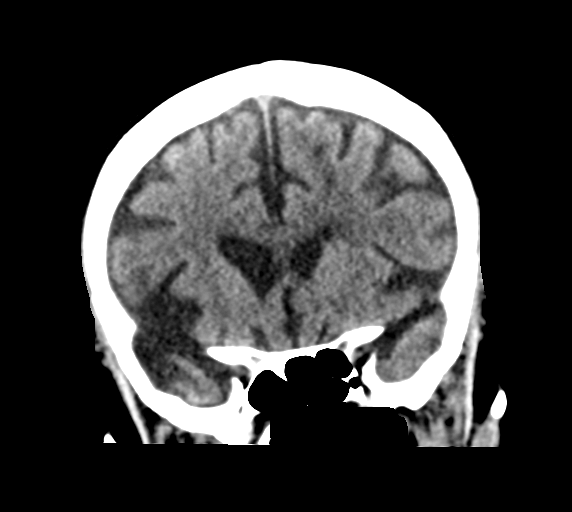
[im 25/57  brain]
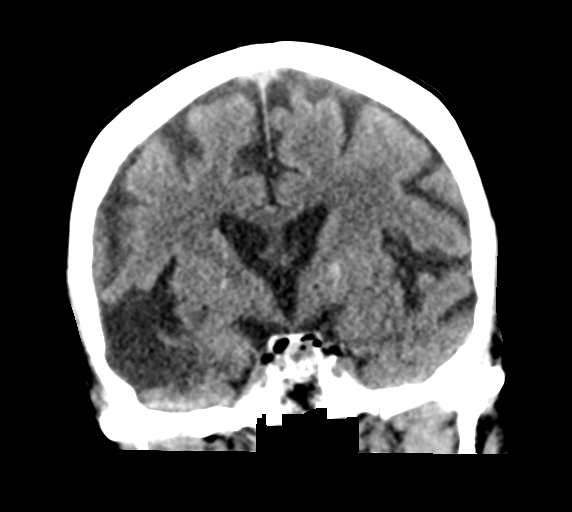
[im 32/57  brain]
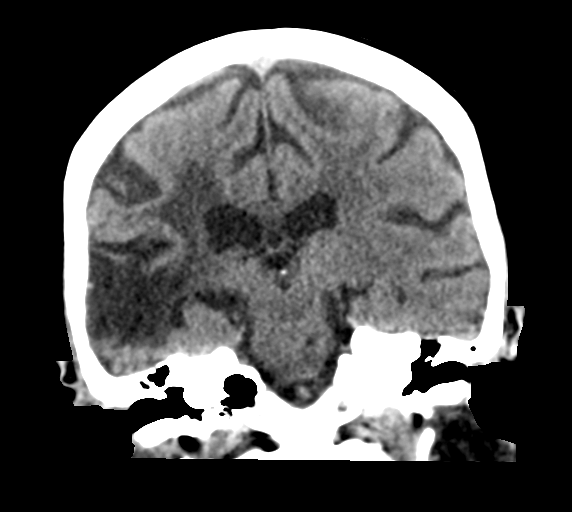

[Series 6: head without sag · sagittal · non-contrast · 0.32mm/px · 3 of 43 slices shown]
[im 15/43  brain]
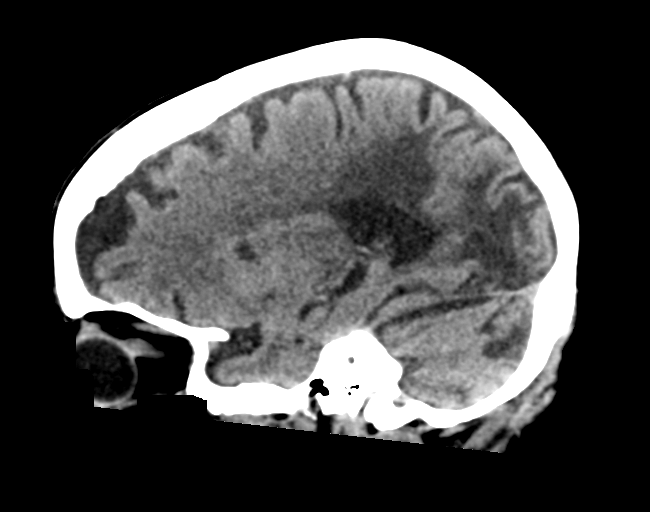
[im 22/43  brain]
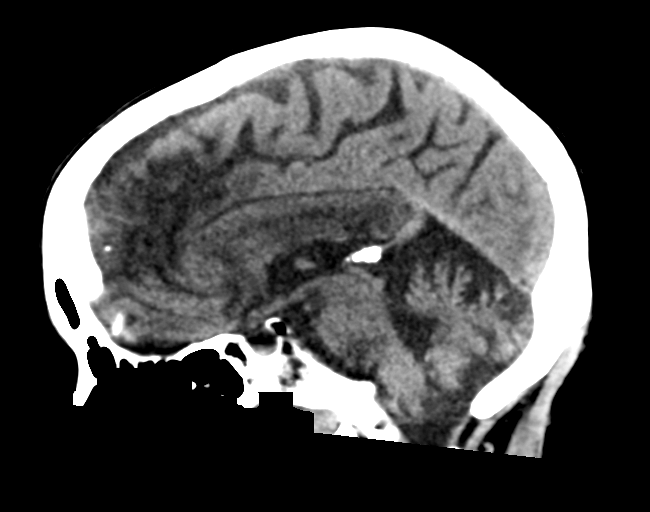
[im 29/43  brain]
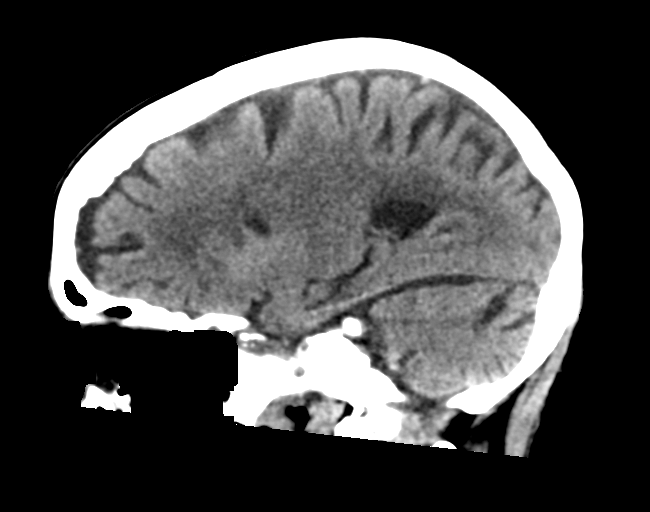

[16 of 47 positions shown; findings below may reference images not displayed]

FINDINGS: Brain: No acute territorial infarction, hemorrhage or intracranial
mass. Chronic right MCA infarct with encephalomalacia in the right
temporal, parietal and occipital lobes. Chronic left frontal lobe
infarct. Multiple chronic lacunar infarcts in the basal ganglia.
Multiple small chronic cerebellar infarcts. Hypodensity within the
pons on sagittal view, suspect related to artifact. There may be
chronic small vessel ischemic change in the pons. Stable ventricle
size. Atrophy and chronic small vessel ischemic changes of the white
matter.

Vascular: No hyperdense vessels. Carotid vascular calcification.
Small of gas in the cavernous sinus possibly related to line
placement.

Skull: Normal. Negative for fracture or focal lesion.

Sinuses/Orbits: No acute finding.

Other: None
IMPRESSION: 1. No definite CT evidence for acute intracranial abnormality.
2. Atrophy and chronic small vessel ischemic changes of the white
matter. Multiple chronic bilateral infarcts including large chronic
right MCA infarct. Hypodensity in the pons is suspected to be due to
combination of artifact and small vessel disease.

## 2023-06-14 ENCOUNTER — Inpatient Hospital Stay (HOSPITAL_COMMUNITY)

## 2023-06-14 ENCOUNTER — Encounter (HOSPITAL_COMMUNITY): Payer: Self-pay

## 2023-06-14 ENCOUNTER — Emergency Department (HOSPITAL_COMMUNITY)

## 2023-06-14 ENCOUNTER — Inpatient Hospital Stay (HOSPITAL_COMMUNITY)
Admission: EM | Admit: 2023-06-14 | Discharge: 2023-07-04 | DRG: 100 | Disposition: A | Discharge status: Home Health | Attending: Internal Medicine | Admitting: Internal Medicine

## 2023-06-14 ENCOUNTER — Other Ambulatory Visit: Payer: Self-pay

## 2023-06-14 DIAGNOSIS — Z681 Body mass index (BMI) 19 or less, adult: Secondary | ICD-10-CM | POA: Diagnosis not present

## 2023-06-14 DIAGNOSIS — C50411 Malignant neoplasm of upper-outer quadrant of right female breast: Secondary | ICD-10-CM | POA: Diagnosis present

## 2023-06-14 DIAGNOSIS — D638 Anemia in other chronic diseases classified elsewhere: Secondary | ICD-10-CM | POA: Diagnosis present

## 2023-06-14 DIAGNOSIS — Z87891 Personal history of nicotine dependence: Secondary | ICD-10-CM

## 2023-06-14 DIAGNOSIS — Z515 Encounter for palliative care: Secondary | ICD-10-CM | POA: Diagnosis not present

## 2023-06-14 DIAGNOSIS — F0390 Unspecified dementia without behavioral disturbance: Secondary | ICD-10-CM

## 2023-06-14 DIAGNOSIS — E8729 Other acidosis: Secondary | ICD-10-CM

## 2023-06-14 DIAGNOSIS — J9601 Acute respiratory failure with hypoxia: Secondary | ICD-10-CM

## 2023-06-14 DIAGNOSIS — D490 Neoplasm of unspecified behavior of digestive system: Secondary | ICD-10-CM | POA: Diagnosis not present

## 2023-06-14 DIAGNOSIS — D696 Thrombocytopenia, unspecified: Secondary | ICD-10-CM | POA: Diagnosis present

## 2023-06-14 DIAGNOSIS — R7989 Other specified abnormal findings of blood chemistry: Secondary | ICD-10-CM | POA: Diagnosis present

## 2023-06-14 DIAGNOSIS — G40409 Other generalized epilepsy and epileptic syndromes, not intractable, without status epilepticus: Secondary | ICD-10-CM | POA: Diagnosis not present

## 2023-06-14 DIAGNOSIS — E785 Hyperlipidemia, unspecified: Secondary | ICD-10-CM | POA: Diagnosis present

## 2023-06-14 DIAGNOSIS — R680 Hypothermia, not associated with low environmental temperature: Secondary | ICD-10-CM | POA: Diagnosis present

## 2023-06-14 DIAGNOSIS — C250 Malignant neoplasm of head of pancreas: Secondary | ICD-10-CM | POA: Diagnosis present

## 2023-06-14 DIAGNOSIS — G40909 Epilepsy, unspecified, not intractable, without status epilepticus: Secondary | ICD-10-CM | POA: Diagnosis not present

## 2023-06-14 DIAGNOSIS — G4089 Other seizures: Secondary | ICD-10-CM | POA: Diagnosis not present

## 2023-06-14 DIAGNOSIS — R638 Other symptoms and signs concerning food and fluid intake: Secondary | ICD-10-CM | POA: Diagnosis not present

## 2023-06-14 DIAGNOSIS — E872 Acidosis, unspecified: Secondary | ICD-10-CM | POA: Diagnosis present

## 2023-06-14 DIAGNOSIS — I10 Essential (primary) hypertension: Secondary | ICD-10-CM | POA: Diagnosis present

## 2023-06-14 DIAGNOSIS — R4182 Altered mental status, unspecified: Secondary | ICD-10-CM | POA: Diagnosis present

## 2023-06-14 DIAGNOSIS — M7989 Other specified soft tissue disorders: Secondary | ICD-10-CM | POA: Diagnosis not present

## 2023-06-14 DIAGNOSIS — E43 Unspecified severe protein-calorie malnutrition: Secondary | ICD-10-CM | POA: Diagnosis present

## 2023-06-14 DIAGNOSIS — E7229 Other disorders of urea cycle metabolism: Secondary | ICD-10-CM | POA: Diagnosis not present

## 2023-06-14 DIAGNOSIS — R531 Weakness: Secondary | ICD-10-CM | POA: Diagnosis not present

## 2023-06-14 DIAGNOSIS — E7220 Disorder of urea cycle metabolism, unspecified: Secondary | ICD-10-CM | POA: Diagnosis not present

## 2023-06-14 DIAGNOSIS — R1312 Dysphagia, oropharyngeal phase: Secondary | ICD-10-CM | POA: Diagnosis present

## 2023-06-14 DIAGNOSIS — Z711 Person with feared health complaint in whom no diagnosis is made: Secondary | ICD-10-CM | POA: Diagnosis not present

## 2023-06-14 DIAGNOSIS — F0150 Vascular dementia without behavioral disturbance: Secondary | ICD-10-CM | POA: Diagnosis present

## 2023-06-14 DIAGNOSIS — Z7189 Other specified counseling: Secondary | ICD-10-CM | POA: Diagnosis not present

## 2023-06-14 DIAGNOSIS — Z8673 Personal history of transient ischemic attack (TIA), and cerebral infarction without residual deficits: Secondary | ICD-10-CM | POA: Diagnosis not present

## 2023-06-14 DIAGNOSIS — Z789 Other specified health status: Secondary | ICD-10-CM

## 2023-06-14 DIAGNOSIS — E876 Hypokalemia: Secondary | ICD-10-CM

## 2023-06-14 DIAGNOSIS — Z79899 Other long term (current) drug therapy: Secondary | ICD-10-CM

## 2023-06-14 DIAGNOSIS — G9341 Metabolic encephalopathy: Secondary | ICD-10-CM | POA: Diagnosis not present

## 2023-06-14 DIAGNOSIS — K567 Ileus, unspecified: Secondary | ICD-10-CM | POA: Diagnosis not present

## 2023-06-14 DIAGNOSIS — Z887 Allergy status to serum and vaccine status: Secondary | ICD-10-CM

## 2023-06-14 DIAGNOSIS — T8089XA Other complications following infusion, transfusion and therapeutic injection, initial encounter: Secondary | ICD-10-CM | POA: Diagnosis not present

## 2023-06-14 DIAGNOSIS — E871 Hypo-osmolality and hyponatremia: Secondary | ICD-10-CM | POA: Diagnosis present

## 2023-06-14 DIAGNOSIS — Z885 Allergy status to narcotic agent status: Secondary | ICD-10-CM

## 2023-06-14 DIAGNOSIS — T426X5A Adverse effect of other antiepileptic and sedative-hypnotic drugs, initial encounter: Secondary | ICD-10-CM | POA: Diagnosis not present

## 2023-06-14 DIAGNOSIS — Z23 Encounter for immunization: Secondary | ICD-10-CM

## 2023-06-14 DIAGNOSIS — Z1152 Encounter for screening for COVID-19: Secondary | ICD-10-CM | POA: Diagnosis not present

## 2023-06-14 DIAGNOSIS — R569 Unspecified convulsions: Secondary | ICD-10-CM | POA: Diagnosis not present

## 2023-06-14 DIAGNOSIS — T730XXA Starvation, initial encounter: Secondary | ICD-10-CM | POA: Diagnosis present

## 2023-06-14 DIAGNOSIS — R008 Other abnormalities of heart beat: Secondary | ICD-10-CM | POA: Diagnosis not present

## 2023-06-14 DIAGNOSIS — Y828 Other medical devices associated with adverse incidents: Secondary | ICD-10-CM | POA: Diagnosis not present

## 2023-06-14 DIAGNOSIS — R4701 Aphasia: Secondary | ICD-10-CM | POA: Diagnosis present

## 2023-06-14 DIAGNOSIS — Z79811 Long term (current) use of aromatase inhibitors: Secondary | ICD-10-CM

## 2023-06-14 DIAGNOSIS — E16A2 Hypoglycemia level 2: Secondary | ICD-10-CM | POA: Diagnosis not present

## 2023-06-14 DIAGNOSIS — R64 Cachexia: Secondary | ICD-10-CM | POA: Diagnosis present

## 2023-06-14 DIAGNOSIS — Z2839 Other underimmunization status: Secondary | ICD-10-CM

## 2023-06-14 DIAGNOSIS — Z170 Estrogen receptor positive status [ER+]: Secondary | ICD-10-CM

## 2023-06-14 LAB — GLUCOSE, CAPILLARY
Glucose-Capillary: 142 mg/dL — ABNORMAL HIGH (ref 70–99)
Glucose-Capillary: 152 mg/dL — ABNORMAL HIGH (ref 70–99)
Glucose-Capillary: 186 mg/dL — ABNORMAL HIGH (ref 70–99)
Glucose-Capillary: 42 mg/dL — CL (ref 70–99)
Glucose-Capillary: 66 mg/dL — ABNORMAL LOW (ref 70–99)
Glucose-Capillary: 69 mg/dL — ABNORMAL LOW (ref 70–99)

## 2023-06-14 LAB — DIFFERENTIAL
Abs Immature Granulocytes: 0.01 10*3/uL (ref 0.00–0.07)
Basophils Absolute: 0 10*3/uL (ref 0.0–0.1)
Basophils Relative: 0 %
Eosinophils Absolute: 0 10*3/uL (ref 0.0–0.5)
Eosinophils Relative: 0 %
Immature Granulocytes: 0 %
Lymphocytes Relative: 21 %
Lymphs Abs: 0.9 10*3/uL (ref 0.7–4.0)
Monocytes Absolute: 0.2 10*3/uL (ref 0.1–1.0)
Monocytes Relative: 5 %
Neutro Abs: 3 10*3/uL (ref 1.7–7.7)
Neutrophils Relative %: 74 %

## 2023-06-14 LAB — COMPREHENSIVE METABOLIC PANEL
ALT: 22 U/L (ref 0–44)
AST: 33 U/L (ref 15–41)
Albumin: 1.9 g/dL — ABNORMAL LOW (ref 3.5–5.0)
Alkaline Phosphatase: 66 U/L (ref 38–126)
Anion gap: 11 (ref 5–15)
BUN: 13 mg/dL (ref 8–23)
CO2: 23 mmol/L (ref 22–32)
Calcium: 7.8 mg/dL — ABNORMAL LOW (ref 8.9–10.3)
Chloride: 99 mmol/L (ref 98–111)
Creatinine, Ser: 0.62 mg/dL (ref 0.44–1.00)
GFR, Estimated: >60 mL/min (ref >60)
Glucose, Bld: 105 mg/dL — ABNORMAL HIGH (ref 70–99)
Potassium: 2.3 mmol/L — CL (ref 3.5–5.1)
Sodium: 133 mmol/L — ABNORMAL LOW (ref 135–145)
Total Bilirubin: 0.4 mg/dL (ref 0.0–1.2)
Total Protein: 3.8 g/dL — ABNORMAL LOW (ref 6.5–8.1)

## 2023-06-14 LAB — BASIC METABOLIC PANEL
Anion gap: 9 (ref 5–15)
Anion gap: 9 (ref 5–15)
BUN: 11 mg/dL (ref 8–23)
BUN: 11 mg/dL (ref 8–23)
CO2: 25 mmol/L (ref 22–32)
CO2: 26 mmol/L (ref 22–32)
Calcium: 9.2 mg/dL (ref 8.9–10.3)
Calcium: 9.2 mg/dL (ref 8.9–10.3)
Chloride: 103 mmol/L (ref 98–111)
Chloride: 107 mmol/L (ref 98–111)
Creatinine, Ser: 0.51 mg/dL (ref 0.44–1.00)
Creatinine, Ser: 0.59 mg/dL (ref 0.44–1.00)
GFR, Estimated: >60 mL/min (ref >60)
GFR, Estimated: >60 mL/min (ref >60)
Glucose, Bld: 156 mg/dL — ABNORMAL HIGH (ref 70–99)
Glucose, Bld: 79 mg/dL (ref 70–99)
Potassium: 3.5 mmol/L (ref 3.5–5.1)
Potassium: 4.1 mmol/L (ref 3.5–5.1)
Sodium: 138 mmol/L (ref 135–145)
Sodium: 141 mmol/L (ref 135–145)

## 2023-06-14 LAB — URINALYSIS, ROUTINE W REFLEX MICROSCOPIC
Bilirubin Urine: NEGATIVE
Glucose, UA: NEGATIVE mg/dL
Hgb urine dipstick: NEGATIVE
Ketones, ur: NEGATIVE mg/dL
Leukocytes,Ua: NEGATIVE
Nitrite: NEGATIVE
Protein, ur: NEGATIVE mg/dL
Specific Gravity, Urine: 1.009 (ref 1.005–1.030)
pH: 8 (ref 5.0–8.0)

## 2023-06-14 LAB — I-STAT ARTERIAL BLOOD GAS, ED
Acid-Base Excess: 5 mmol/L — ABNORMAL HIGH (ref 0.0–2.0)
Bicarbonate: 29 mmol/L — ABNORMAL HIGH (ref 20.0–28.0)
Calcium, Ion: 1.22 mmol/L (ref 1.15–1.40)
HCT: 35 % — ABNORMAL LOW (ref 36.0–46.0)
Hemoglobin: 11.9 g/dL — ABNORMAL LOW (ref 12.0–15.0)
O2 Saturation: 100 %
Patient temperature: 91.5
Potassium: 2.5 mmol/L — CL (ref 3.5–5.1)
Sodium: 135 mmol/L (ref 135–145)
TCO2: 30 mmol/L (ref 22–32)
pCO2 arterial: 34.6 mmHg (ref 32–48)
pH, Arterial: 7.515 — ABNORMAL HIGH (ref 7.35–7.45)
pO2, Arterial: 479 mmHg — ABNORMAL HIGH (ref 83–108)

## 2023-06-14 LAB — RAPID URINE DRUG SCREEN, HOSP PERFORMED
Amphetamines: NOT DETECTED
Barbiturates: NOT DETECTED
Benzodiazepines: POSITIVE — AB
Cocaine: NOT DETECTED
Opiates: NOT DETECTED
Tetrahydrocannabinol: NOT DETECTED

## 2023-06-14 LAB — CBC
HCT: 28.4 % — ABNORMAL LOW (ref 36.0–46.0)
Hemoglobin: 9.8 g/dL — ABNORMAL LOW (ref 12.0–15.0)
MCH: 33.1 pg (ref 26.0–34.0)
MCHC: 34.5 g/dL (ref 30.0–36.0)
MCV: 95.9 fL (ref 80.0–100.0)
Platelets: 97 10*3/uL — ABNORMAL LOW (ref 150–400)
RBC: 2.96 MIL/uL — ABNORMAL LOW (ref 3.87–5.11)
RDW: 14.1 % (ref 11.5–15.5)
WBC: 4.1 10*3/uL (ref 4.0–10.5)
nRBC: 0 % (ref 0.0–0.2)

## 2023-06-14 LAB — I-STAT CHEM 8, ED
BUN: 17 mg/dL (ref 8–23)
Calcium, Ion: 1.15 mmol/L (ref 1.15–1.40)
Chloride: 97 mmol/L — ABNORMAL LOW (ref 98–111)
Creatinine, Ser: 0.6 mg/dL (ref 0.44–1.00)
Glucose, Bld: 89 mg/dL (ref 70–99)
HCT: 36 % (ref 36.0–46.0)
Hemoglobin: 12.2 g/dL (ref 12.0–15.0)
Potassium: 2.9 mmol/L — ABNORMAL LOW (ref 3.5–5.1)
Sodium: 134 mmol/L — ABNORMAL LOW (ref 135–145)
TCO2: 26 mmol/L (ref 22–32)

## 2023-06-14 LAB — OSMOLALITY, URINE: Osmolality, Ur: 180 mosm/kg — ABNORMAL LOW (ref 300–900)

## 2023-06-14 LAB — CBG MONITORING, ED
Glucose-Capillary: 122 mg/dL — ABNORMAL HIGH (ref 70–99)
Glucose-Capillary: 158 mg/dL — ABNORMAL HIGH (ref 70–99)
Glucose-Capillary: 68 mg/dL — ABNORMAL LOW (ref 70–99)

## 2023-06-14 LAB — ETHANOL: Alcohol, Ethyl (B): <10 mg/dL (ref <10)

## 2023-06-14 LAB — PROTIME-INR
INR: 1.3 — ABNORMAL HIGH (ref 0.8–1.2)
Prothrombin Time: 16.2 s — ABNORMAL HIGH (ref 11.4–15.2)

## 2023-06-14 LAB — RESP PANEL BY RT-PCR (RSV, FLU A&B, COVID)  RVPGX2
Influenza A by PCR: NEGATIVE
Influenza B by PCR: NEGATIVE
Resp Syncytial Virus by PCR: NEGATIVE
SARS Coronavirus 2 by RT PCR: NEGATIVE

## 2023-06-14 LAB — HEMOGLOBIN A1C
Hgb A1c MFr Bld: 4.7 % — ABNORMAL LOW (ref 4.8–5.6)
Mean Plasma Glucose: 88.19 mg/dL

## 2023-06-14 LAB — CREATININE, URINE, RANDOM: Creatinine, Urine: <10 mg/dL

## 2023-06-14 LAB — I-STAT CG4 LACTIC ACID, ED
Lactic Acid, Venous: 3 mmol/L (ref 0.5–1.9)
Lactic Acid, Venous: 1.8 mmol/L (ref 0.5–1.9)

## 2023-06-14 LAB — MRSA NEXT GEN BY PCR, NASAL: MRSA by PCR Next Gen: NOT DETECTED

## 2023-06-14 LAB — SODIUM, URINE, RANDOM: Sodium, Ur: 79 mmol/L

## 2023-06-14 LAB — APTT: aPTT: 33 s (ref 24–36)

## 2023-06-14 LAB — CORTISOL: Cortisol, Plasma: 19.5 ug/dL

## 2023-06-14 MED ORDER — FENTANYL CITRATE PF 50 MCG/ML IJ SOSY: 25.0000 ug | PREFILLED_SYRINGE | INTRAMUSCULAR | Status: DC | PRN

## 2023-06-14 MED ORDER — MAGNESIUM SULFATE 2 GM/50ML IV SOLN
2.0000 g | Freq: Once | INTRAVENOUS | Status: AC
  Administered 2023-06-14: 2 g via INTRAVENOUS
  Filled 2023-06-14: qty 50

## 2023-06-14 MED ORDER — POLYETHYLENE GLYCOL 3350 17 G PO PACK: 17.0000 g | PACK | Freq: Every day | ORAL | Status: DC | PRN

## 2023-06-14 MED ORDER — DEXTROSE 50 % IV SOLN
INTRAVENOUS | Status: AC
  Administered 2023-06-14: 12.5 g via INTRAVENOUS
  Filled 2023-06-14: qty 50

## 2023-06-14 MED ORDER — IOHEXOL 350 MG/ML SOLN
75.0000 mL | Freq: Once | INTRAVENOUS | Status: AC | PRN
  Administered 2023-06-14: 75 mL via INTRAVENOUS

## 2023-06-14 MED ORDER — POTASSIUM CHLORIDE 10 MEQ/100ML IV SOLN
10.0000 meq | INTRAVENOUS | Status: AC
  Administered 2023-06-14 (×3): 10 meq via INTRAVENOUS
  Filled 2023-06-14 (×3): qty 100

## 2023-06-14 MED ORDER — SODIUM CHLORIDE 0.9 % IV SOLN
250.0000 mL | INTRAVENOUS | Status: AC
  Administered 2023-06-14: 250 mL via INTRAVENOUS

## 2023-06-14 MED ORDER — DEXTROSE 50 % IV SOLN: 12.5000 g | Freq: Once | INTRAVENOUS | Status: AC

## 2023-06-14 MED ORDER — SODIUM CHLORIDE 0.9 % IV SOLN
2.0000 g | Freq: Once | INTRAVENOUS | Status: AC
  Administered 2023-06-14: 2 g via INTRAVENOUS
  Filled 2023-06-14: qty 12.5

## 2023-06-14 MED ORDER — DEXTROSE 50 % IV SOLN
12.5000 g | INTRAVENOUS | Status: AC
Start: 2023-06-14 — End: 2023-06-14
  Administered 2023-06-14: 12.5 g via INTRAVENOUS

## 2023-06-14 MED ORDER — INSULIN ASPART 100 UNIT/ML IJ SOLN
0.0000 [IU] | INTRAMUSCULAR | Status: DC
  Administered 2023-06-14: 2 [IU] via SUBCUTANEOUS

## 2023-06-14 MED ORDER — DEXMEDETOMIDINE HCL IN NACL 400 MCG/100ML IV SOLN
0.0000 ug/kg/h | INTRAVENOUS | Status: DC
  Filled 2023-06-14: qty 100

## 2023-06-14 MED ORDER — ALBUMIN HUMAN 25 % IV SOLN
25.0000 g | Freq: Four times a day (QID) | INTRAVENOUS | Status: AC
  Administered 2023-06-14 – 2023-06-15 (×2): 25 g via INTRAVENOUS
  Filled 2023-06-14 (×2): qty 100

## 2023-06-14 MED ORDER — ORAL CARE MOUTH RINSE: 15.0000 mL | OROMUCOSAL | Status: DC | PRN

## 2023-06-14 MED ORDER — DEXTROSE IN LACTATED RINGERS 5 % IV SOLN: INTRAVENOUS | Status: DC

## 2023-06-14 MED ORDER — LACTATED RINGERS IV BOLUS
1000.0000 mL | Freq: Once | INTRAVENOUS | Status: AC
  Administered 2023-06-14: 1000 mL via INTRAVENOUS

## 2023-06-14 MED ORDER — LEVETIRACETAM IN NACL 500 MG/100ML IV SOLN
500.0000 mg | Freq: Two times a day (BID) | INTRAVENOUS | Status: DC
  Administered 2023-06-14 – 2023-06-21 (×14): 500 mg via INTRAVENOUS
  Filled 2023-06-14 (×16): qty 100

## 2023-06-14 MED ORDER — DEXTROSE 50 % IV SOLN
INTRAVENOUS | Status: AC
  Filled 2023-06-14: qty 50

## 2023-06-14 MED ORDER — DEXTROSE 50 % IV SOLN: 25.0000 g | INTRAVENOUS | Status: AC

## 2023-06-14 MED ORDER — PROPOFOL 1000 MG/100ML IV EMUL: 5.0000 ug/kg/min | INTRAVENOUS | Status: DC

## 2023-06-14 MED ORDER — NOREPINEPHRINE 4 MG/250ML-% IV SOLN: 2.0000 ug/min | INTRAVENOUS | Status: DC

## 2023-06-14 MED ORDER — VANCOMYCIN HCL IN DEXTROSE 1-5 GM/200ML-% IV SOLN
1000.0000 mg | Freq: Once | INTRAVENOUS | Status: AC
  Administered 2023-06-14: 1000 mg via INTRAVENOUS
  Filled 2023-06-14: qty 200

## 2023-06-14 MED ORDER — LABETALOL HCL 5 MG/ML IV SOLN
10.0000 mg | INTRAVENOUS | Status: DC | PRN
Start: 2023-06-14 — End: 2023-07-04

## 2023-06-14 MED ORDER — PROPOFOL 1000 MG/100ML IV EMUL
INTRAVENOUS | Status: AC
  Administered 2023-06-14: 5 ug/kg/min via INTRAVENOUS
  Filled 2023-06-14: qty 100

## 2023-06-14 MED ORDER — THIAMINE HCL 100 MG/ML IJ SOLN
500.0000 mg | Freq: Three times a day (TID) | INTRAVENOUS | Status: AC
  Administered 2023-06-14 – 2023-06-16 (×6): 500 mg via INTRAVENOUS
  Filled 2023-06-14 (×6): qty 5

## 2023-06-14 MED ORDER — FAMOTIDINE 20 MG PO TABS
20.0000 mg | ORAL_TABLET | Freq: Two times a day (BID) | ORAL | Status: DC
  Administered 2023-06-14: 20 mg
  Filled 2023-06-14 (×2): qty 1

## 2023-06-14 MED ORDER — LEVETIRACETAM 500 MG/5ML IV SOLN
3000.0000 mg | Freq: Once | INTRAVENOUS | Status: AC
  Administered 2023-06-14: 3000 mg via INTRAVENOUS
  Filled 2023-06-14: qty 30

## 2023-06-14 MED ORDER — LACTATED RINGERS IV SOLN: INTRAVENOUS | Status: DC

## 2023-06-14 MED ORDER — THIAMINE HCL 100 MG/ML IJ SOLN
100.0000 mg | INTRAMUSCULAR | Status: AC
  Administered 2023-06-16 – 2023-06-17 (×2): 100 mg via INTRAVENOUS
  Filled 2023-06-14 (×2): qty 2

## 2023-06-14 MED ORDER — POTASSIUM CHLORIDE 10 MEQ/100ML IV SOLN
10.0000 meq | INTRAVENOUS | Status: AC
  Administered 2023-06-14 (×4): 10 meq via INTRAVENOUS
  Filled 2023-06-14 (×4): qty 100

## 2023-06-14 MED ORDER — POTASSIUM CHLORIDE 20 MEQ PO PACK
20.0000 meq | PACK | Freq: Once | ORAL | Status: AC
  Administered 2023-06-14: 20 meq
  Filled 2023-06-14: qty 1

## 2023-06-14 MED ORDER — DOCUSATE SODIUM 50 MG/5ML PO LIQD: 100.0000 mg | Freq: Two times a day (BID) | ORAL | Status: DC | PRN

## 2023-06-14 MED ORDER — PANTOPRAZOLE SODIUM 40 MG IV SOLR
40.0000 mg | Freq: Every day | INTRAVENOUS | Status: DC
  Administered 2023-06-14 – 2023-06-16 (×3): 40 mg via INTRAVENOUS
  Filled 2023-06-14 (×3): qty 10

## 2023-06-14 MED ORDER — CHLORHEXIDINE GLUCONATE CLOTH 2 % EX PADS
6.0000 | MEDICATED_PAD | Freq: Every day | CUTANEOUS | Status: DC
  Administered 2023-06-14 – 2023-07-03 (×20): 6 via TOPICAL

## 2023-06-14 MED ORDER — DEXTROSE 50 % IV SOLN
INTRAVENOUS | Status: AC
  Administered 2023-06-14: 25 g via INTRAVENOUS
  Filled 2023-06-14: qty 50

## 2023-06-14 MED ORDER — LEVETIRACETAM IN NACL 500 MG/100ML IV SOLN: 500.0000 mg | Freq: Two times a day (BID) | INTRAVENOUS | Status: DC

## 2023-06-14 MED ORDER — METRONIDAZOLE 500 MG/100ML IV SOLN
500.0000 mg | Freq: Once | INTRAVENOUS | Status: AC
  Administered 2023-06-14: 500 mg via INTRAVENOUS
  Filled 2023-06-14: qty 100

## 2023-06-14 MED ORDER — SODIUM CHLORIDE 0.9 % IV SOLN
1.0000 mg | Freq: Every day | INTRAVENOUS | Status: DC
  Administered 2023-06-14 – 2023-06-17 (×4): 1 mg via INTRAVENOUS
  Filled 2023-06-14 (×4): qty 0.2

## 2023-06-14 MED ORDER — HEPARIN SODIUM (PORCINE) 5000 UNIT/ML IJ SOLN
5000.0000 [IU] | Freq: Three times a day (TID) | INTRAMUSCULAR | Status: DC
  Administered 2023-06-14 – 2023-07-03 (×60): 5000 [IU] via SUBCUTANEOUS
  Filled 2023-06-14 (×59): qty 1

## 2023-06-14 MED ORDER — ETOMIDATE 2 MG/ML IV SOLN
20.0000 mg | INTRAVENOUS | Status: AC
  Administered 2023-06-14: 20 mg via INTRAVENOUS

## 2023-06-14 MED ORDER — ORAL CARE MOUTH RINSE
15.0000 mL | OROMUCOSAL | Status: DC
  Administered 2023-06-14 – 2023-07-03 (×68): 15 mL via OROMUCOSAL

## 2023-06-14 MED ORDER — SUCCINYLCHOLINE CHLORIDE 200 MG/10ML IV SOSY
150.0000 mg | PREFILLED_SYRINGE | INTRAVENOUS | Status: AC
  Administered 2023-06-14: 150 mg via INTRAVENOUS

## 2023-06-14 NOTE — ED Notes (Signed)
 Patient transported to CT with this RN and RN Mindy with cardiac monitor

## 2023-06-14 NOTE — Care Plan (Signed)
 LTM eeg reviewed till 0900. No seizures. Please review fnal report for details.  Olivia Ross [name]

## 2023-06-14 NOTE — Progress Notes (Signed)
 Patient transported to 2H via mechanical ventilation with no incidence.

## 2023-06-14 NOTE — Progress Notes (Signed)
 eLink Physician-Brief Progress Note Patient Name: Olivia Ross DOB: September 24, 1945 MRN: [id number]   Date of Service  06/14/2023  HPI/Events of Note  Notified of recurrent hypoglycemic episodes despite D5 LR 75/hr Negative fluid balance past 24 hours Patient appears weak and frail and as per BSRN to lethargic to safely take PO  eICU Interventions  Increased D5LR to 100 cc/hr. Watch out for signs of congestion Consider insertion of feeding tube Noted Palliative care consult for AM     Intervention Category Intermediate Interventions: Other:  Darl Pikes 06/14/2023, 8:39 PM

## 2023-06-14 NOTE — ED Notes (Signed)
 EEG at bedside.

## 2023-06-14 NOTE — Sepsis Progress Note (Signed)
 Elink monitoring for the code sepsis protocol.

## 2023-06-14 NOTE — ED Notes (Addendum)
 Olivia Blazing, MD made aware of CBG 68.

## 2023-06-14 NOTE — Procedures (Signed)
 Extubation Procedure Note  Patient Details:   Name: Olivia Ross DOB: [birth year] MRN: [id number]   Airway Documentation:    Vent end date: 06/14/23 Vent end time: 1455   Evaluation  O2 sats: stable throughout Complications: No apparent complications Patient did tolerate procedure well. Bilateral Breath Sounds: Clear, Diminished   Yes, pt able to cough to clear secretions. Pt positive for cuff leak prior to extubation. Placed pt on 4L humidified nasal cannula   Tacy Learn 06/14/2023, 3:01 PM

## 2023-06-14 NOTE — ED Notes (Signed)
 Dr. Wilkie Aye spoke with daughter and gave update

## 2023-06-14 NOTE — ED Notes (Signed)
 Multiple attempts to call pt's daughter by this RN and neurologist w/o answer, called charline hoskinson, [phone], at times phone rings and other goes straight to VM. First call was by neurologist shortly after pt came back from CT scan. Dr. Wilkie Aye has also attempted to call daughter and phone went to VM.

## 2023-06-14 NOTE — ED Notes (Signed)
 Dr. Wilkie Aye to intubate after first set of vitals, medication at bedside, RT at bedside.

## 2023-06-14 NOTE — ED Triage Notes (Addendum)
 Pt arrived from home for CODE STROKE, pt contracted on the right side which is old from 2 prior strokes. Family thought pt was more demented then normal and possible UTI. Pt was initially alert, had a seizure in route witnssed by EMS, gave IM versed, then in route pt became apneic, assisted ventilation by BVM and became unresponsive. LKW 22:00 on 3/14.  5mg  versed IM (left thigh) PTA by EMS Initial BP 110 SBP, then 80/44 HR 60-80 Afib with hx of same.  98% BVM 13 RR CBG 114

## 2023-06-14 NOTE — ED Notes (Signed)
 Daughter at bedside.

## 2023-06-14 NOTE — ED Notes (Signed)
 20 mg etomidate & 150 mg succinylcholine push PIV by [employer] RN

## 2023-06-14 NOTE — Progress Notes (Signed)
 06/14/2023  Seen and examined. Daughter at bedside. Seizures seem ablated on LTVEEG Bigger picture has been profound weight loss and GI issues Dementia symptoms stable, vascular subtype No clear aspiration symptoms Continue LTVEEG, if no seizures may wean off prop while EEG monitoring in place CT A/P to help guide GOC discussions F/u MRI If start TF tomorrow need to watch closely for refeeding Trial of high dose thiamine (starvation) Daughter is open to talking with palliative (she's a clinical Child psychotherapist) but QoL seems to have been pretty good up until present admission (last admit was 2 years ago).  Lung mechanics look fine, can probably wean to extubate once we assure no further seizures.  Additional CC time 40 mins Myrla Halsted MD PCCM

## 2023-06-14 NOTE — ED Notes (Signed)
 Pt return from CT scanner with this RN, Mindy, and RT, lab tech Caspar at bedside to help obtain labs there were unsuccessful earlier.

## 2023-06-14 NOTE — Code Documentation (Addendum)
 Responded to Code Stroke called at 0321 for L sided gaze and aphasia, [id number]. En route to ED, pt seized, was given versed 5mg , and became unresponsive and apneic. Pt arrived at 0334 being bagged and was immediately taken to Trauma room. Pt was intubated after giving 20mg  etomidate and 150mg  succ. CBG-89 , NIH-30 prior to intubation, CT head-negative for acute changes, CTA-no acute LVO. TNK not given-pt outside window for interventions. Plan: ICU admission/seizure w/o.

## 2023-06-14 NOTE — ED Notes (Signed)
 Unsuccessful blood draw.  KM

## 2023-06-14 NOTE — ED Notes (Signed)
 Pt's BP is dropping, accuracy checked, admitting was sent message & secretary paging a provider for this pts care to this RN's phone.

## 2023-06-14 NOTE — ED Notes (Signed)
 Pt intubated ET 7.5 by PA James 21 secured at the lips + color change Breath sounds bilaterally

## 2023-06-14 NOTE — ED Notes (Signed)
 Please call, jakyra kenealy, [phone]

## 2023-06-14 NOTE — ED Notes (Signed)
 Critical NP called this RN about pt's BP.

## 2023-06-14 NOTE — H&P (Signed)
 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  Family thought she may have UTI.  Patient initially alert then had a seizure en route to hospital.  EMS gave 5mg  IM Versed, but then she became apneic and she was intubated (Etomidate and Succinylcholine) in the ED.  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 rate 16, height 5\' 3"  (1.6 m), weight 41.6 kg, SpO2 100%.    Vent Mode: PRVC FiO2 (%):  [100 %] 100 % Set Rate:  [18 bmp] 18 bmp Vt Set:  [340 mL] 340 mL PEEP:  [5 cmH20] 5 cmH20 Plateau Pressure:  [14 cmH20] 14 cmH20   Intake/Output Summary (Last 24 hours) at 06/14/2023 0612 Last data filed at 06/14/2023 0444 Gross per 24 hour  Intake 250 ml  Output --  Net 250 ml   Filed Weights   06/14/23 0400 06/14/23 0420  Weight: 41.6 kg 41.6 kg    Examination: General: thin elderly female intubated in ED HENT: Pupils round equal and reactive Lungs: CTA b/l no wheezes and rhonchi Cardiovascular: reg s1s2 no gallops or rubs Abdomen: soft nt nd bs pos Extremities: no cyanosis, clubbing or edema Neuro: Sedated on mech vent, no obvious   Resolved Hospital Problem list   N/a  Assessment & Plan:  Acute hypoxic respiratory failure  Vent management Seizure d/o  Keppra load, maintain 500mg   BID  Neurology following  Continuous EEG Lactic Acidosis  IVF Hypokalemia  Being replaced Best Practice (right click and "Reselect all SmartList Selections" daily)   Diet/type: NPO w/ meds via tube DVT prophylaxis LMWH Pressure ulcer(s): N/A GI prophylaxis: H2B Lines: N/A Foley:  N/A Code Status:  full code   Labs   CBC: Recent Labs  Lab 06/14/23 0404 06/14/23 0423 06/14/23 0529  WBC  --  4.1  --   NEUTROABS  --  3.0  --   HGB 12.2 9.8* 11.9*  HCT 36.0 28.4* 35.0*  MCV  --  95.9  --   PLT  --  97*  --     Basic Metabolic Panel: Recent Labs  Lab 06/14/23 0404 06/14/23 0423 06/14/23 0529  NA 134* 133* 135  K 2.9* 2.3* 2.5*  CL 97* 99  --   CO2  --  23  --   GLUCOSE 89 105*  --   BUN 17 13  --   CREATININE 0.60 0.62  --   CALCIUM  --  7.8*  --    GFR: Estimated Creatinine Clearance: 37.4 mL/min (by C-G formula based on SCr of 0.62 mg/dL). Recent Labs  Lab 06/14/23 0423 06/14/23 0551  WBC 4.1  --   LATICACIDVEN  --  3.0*    Liver Function Tests: Recent Labs  Lab 06/14/23 0423  AST 33  ALT 22  ALKPHOS 66  BILITOT 0.4  PROT 3.8*  ALBUMIN 1.9*   No results for input(s): "LIPASE", "AMYLASE" in the last 168 hours. No results for input(s): "AMMONIA" in the last 168 hours.  ABG    Component Value Date/Time   PHART 7.515 (H) 06/14/2023 0529   PCO2ART 34.6 06/14/2023 0529   PO2ART 479 (H) 06/14/2023 0529   HCO3 29.0 (H) 06/14/2023 0529   TCO2 30 06/14/2023 0529   O2SAT 100 06/14/2023 0529     Coagulation Profile: Recent Labs  Lab 06/14/23 0423  INR 1.3*    Cardiac Enzymes: No results for input(s): "CKTOTAL", "CKMB", "CKMBINDEX", "TROPONINI" in the last 168 hours.  HbA1C: Hgb A1c MFr Bld  Date/Time Value Ref Range Status  02/02/2022 09:36 PM 4.7 (L) 4.8 - 5.6 % Final    Comment:    (NOTE) Pre diabetes:          5.7%-6.4%  Diabetes:              >6.4%  Glycemic control for   <7.0% adults with diabetes   12/21/2020 03:30 AM 5.7  (H) 4.8 - 5.6 % Final    Comment:    (NOTE)         Prediabetes: 5.7 - 6.4         Diabetes: >6.4         Glycemic control for adults with diabetes: <7.0     CBG: No results for input(s): "GLUCAP" in the last 168 hours.  Review of Systems:   Unable to obtain, patient intubated and sedated  Past Medical History:  She,  has a past medical history of Breast cancer (HCC), CVA (cerebral vascular accident) (HCC), Dementia (HCC), Hypertension, and Seizure (HCC) (07/2020).   Surgical History:  History reviewed. No pertinent surgical history.   Social History:   reports that she has quit smoking. Her smoking use included cigarettes. She has a 50 pack-year smoking history. She has never used smokeless tobacco. She reports that she does not drink alcohol and does not use drugs.   Family History:  Her family history is not on file.   Allergies Allergies  Allergen Reactions   Diphth-Acell Pertussis-Tetanus     Other reaction(s): Other (See Comments)   Dtap-Hepatitis B Recomb-Ipv Other (See Comments)    unknown   Norco [Hydrocodone-Acetaminophen] Nausea And Vomiting   Percocet [Oxycodone-Acetaminophen] Nausea And Vomiting     Home Medications  Prior to Admission medications   Medication Sig Start Date End Date Taking? Authorizing Provider  acetaminophen (TYLENOL) 500 MG tablet Take 500 mg by mouth every 6 (six) hours as needed for moderate pain.    [provider]  amLODipine (NORVASC) 5 MG tablet Take 1 tablet (5 mg total) by mouth daily. 08/28/21   Burnadette Pop, MD  atorvastatin (LIPITOR) 80 MG tablet Take 1 tablet (80 mg total) by mouth daily. 08/29/21   Burnadette Pop, MD  carboxymethylcellulose (REFRESH PLUS) 0.5 % SOLN Place 1 drop into both eyes 3 (three) times daily as needed (dry eyes).    [provider]  dicyclomine (BENTYL) 10 MG capsule Take 10 mg by mouth daily as needed for spasms. 07/24/20   [provider]  ezetimibe (ZETIA) 10 MG tablet  Take 1 tablet (10 mg total) by mouth daily. 02/06/22   Azucena Fallen, MD  letrozole [facility]) 2.5 MG tablet Take 2.5 mg by mouth daily. 12/12/20   [provider]  levETIRAcetam (KEPPRA) 250 MG tablet Take 1 tablet (250 mg  total) by mouth 2 (two) times daily. 02/06/22   Fayrene Helper, PA-C  loperamide (IMODIUM A-D) 2 MG tablet Take 2 mg by mouth 4 (four) times daily as needed for diarrhea or loose stools.    [provider]  magnesium oxide (MAG-OX) 400 (240 Mg) MG tablet Take 0.5 tablets (200 mg total) by mouth 2 (two) times daily. 12/22/20   Regalado, Belkys A, MD  Multiple Vitamin (MULTIVITAMIN WITH MINERALS) TABS tablet Take 1 tablet by mouth daily. Centrum    [provider]  valsartan (DIOVAN) 160 MG tablet Take 160 mg by mouth daily. 12/12/20   [provider]  vitamin B-12 100 MCG tablet Take 1 tablet (100 mcg total) by mouth daily. 12/23/20   Regalado, Jon Billings A, MD     Critical care time: 44   The patient is critically ill with multiple organ system failure and requires high complexity decision making for assessment and support, frequent evaluation and titration of therapies, advanced monitoring, review of radiographic studies and interpretation of complex data.   Critical Care Time devoted to patient care services, exclusive of separately billable procedures, described in this note is 37 minutes.   Rozann Lesches, MD [facility] See Amion for pager  If no response to pager , please call [phone] until 7pm After 7:00 pm call Elink  [phone] 06/14/2023, 6:12 AM

## 2023-06-14 NOTE — Consult Note (Signed)
 NEUROLOGY CONSULT NOTE   Date of service: June 14, 2023 Patient Name: Cola Gane MRN:  [id number] DOB:  [birth year] Chief Complaint: "L gaze, aphasia" Requesting Provider: Shon Baton, MD  History of Present Illness  Harlene Berryhill is a 78 y.o. 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  Patient lives with daughter and has a LKW of 2200 on 06/13/23. Daughter woke up when she heard strange noises coming from patient's room. Noted that patient was not making sense, talking gibberish and had wetted the bed. Daughter thought this was a UTI and so called EMS for confusion.  EMS arrived to fine patient has a L gaze preference initially, not moving the left side and not making sense. Her eyes would not cross midline. Enroute, patient develoepd forced L gaze deviation with her arms up and outstretched for 30 secs, seeming like she was posturing and seizing. She was given Versed and was obtuded and had to be bagged by EMS.  She was still being bagged on arrival to the ED and tachycardic to 140s. She had to be intubated prior to imaging.  Unable to get in touch with her daughter on the listed phone number.  Of note, patient had 3 syncopal events in may 2023 and was unwitnessed at onset and was felt to be possibly concerning for a seizure.  LKW: 2200 on 06/13/23. Modified rankin score: 4-Needs assistance to walk and tend to bodily needs IV Thrombolysis: not offered, she is outside window EVT: not offered, no LVO  NIHSS components Score: Comment  1a Level of Conscious 0[]  1[]  2[]  3[x]      1b LOC Questions 0[]  1[]  2[x]       1c LOC Commands 0[]  1[]  2[x]       2 Best Gaze 0[x]  1[]  2[]       3 Visual 0[x]  1[]  2[]  3[]      4 Facial Palsy 0[x]  1[]  2[]  3[]      5a Motor Arm - left 0[]  1[]  2[]  3[]  4[x]  UN[]    5b Motor Arm - Right 0[]  1[]  2[]  3[]  4[x]  UN[]    6a Motor Leg - Left 0[]  1[]  2[]  3[]  4[x]   UN[]    6b Motor Leg - Right 0[]  1[]  2[]  3[]  4[x]  UN[]    7 Limb Ataxia 0[x]  1[]  2[]  3[]  UN[]     8 Sensory 0[]  1[]  2[x]  UN[]      9 Best Language 0[]  1[]  2[]  3[x]      10 Dysarthria 0[]  1[]  2[x]  UN[]      11 Extinct. and Inattention 0[x]  1[]  2[]       TOTAL: 30      ROS  Unable to ascertain due to obtunded mentation.  Past History   Past Medical History:  Diagnosis Date   Breast cancer [facility])    CVA (cerebral vascular accident) (HCC)    Dementia (HCC)    Hypertension    Seizure (HCC) 07/2020    No past surgical history on file.  Family History: No family history on file.  Social History  reports that she has quit smoking. Her smoking use included cigarettes. She has a 50 pack-year smoking history. She has never used smokeless tobacco. She reports that she does not drink alcohol and does not use drugs.  Allergies  Allergen Reactions   Diphth-Acell Pertussis-Tetanus     Other reaction(s): Other (See Comments)   Dtap-Hepatitis B Recomb-Ipv Other (See Comments)  unknown   Norco [Hydrocodone-Acetaminophen] Nausea And Vomiting   Percocet [Oxycodone-Acetaminophen] Nausea And Vomiting    Medications  No current facility-administered medications for this encounter.  Current Outpatient Medications:    acetaminophen (TYLENOL) 500 MG tablet, Take 500 mg by mouth every 6 (six) hours as needed for moderate pain., Disp: , Rfl:    amLODipine (NORVASC) 5 MG tablet, Take 1 tablet (5 mg total) by mouth daily., Disp: 30 tablet, Rfl: 1   atorvastatin (LIPITOR) 80 MG tablet, Take 1 tablet (80 mg total) by mouth daily., Disp: 30 tablet, Rfl: 1   carboxymethylcellulose (REFRESH PLUS) 0.5 % SOLN, Place 1 drop into both eyes 3 (three) times daily as needed (dry eyes)., Disp: , Rfl:    dicyclomine (BENTYL) 10 MG capsule, Take 10 mg by mouth daily as needed for spasms., Disp: , Rfl:    ezetimibe (ZETIA) 10 MG tablet, Take 1 tablet (10 mg total) by mouth daily., Disp: 30 tablet, Rfl: 2   letrozole  (FEMARA) 2.5 MG tablet, Take 2.5 mg by mouth daily., Disp: , Rfl:    levETIRAcetam (KEPPRA) 250 MG tablet, Take 1 tablet (250 mg total) by mouth 2 (two) times daily., Disp: 30 tablet, Rfl: 1   loperamide (IMODIUM A-D) 2 MG tablet, Take 2 mg by mouth 4 (four) times daily as needed for diarrhea or loose stools., Disp: , Rfl:    magnesium oxide (MAG-OX) 400 (240 Mg) MG tablet, Take 0.5 tablets (200 mg total) by mouth 2 (two) times daily., Disp: 15 tablet, Rfl: 0   Multiple Vitamin (MULTIVITAMIN WITH MINERALS) TABS tablet, Take 1 tablet by mouth daily. Centrum, Disp: , Rfl:    valsartan (DIOVAN) 160 MG tablet, Take 160 mg by mouth daily., Disp: , Rfl:    vitamin B-12 100 MCG tablet, Take 1 tablet (100 mcg total) by mouth daily., Disp: 30 tablet, Rfl: 0  Vitals   Vitals:   06/14/23 0343  Temp: (!) 95.6 F (35.3 C)  TempSrc: Temporal    There is no height or weight on file to calculate BMI.  Physical Exam   General: appears frail, old and cachectic, being bagged HENT: Normal oropharynx and mucosa. Normal external appearance of ears and nose.  Neck: Supple, no pain or tenderness  CV: No JVD. No peripheral edema.  Pulmonary: Symmetric Chest rise. Shallow infrequent breaths. Abdomen: Soft to touch, non-tender. Ext: No cyanosis, edema, or deformity Skin: No rash. Normal palpation of skin.   Musculoskeletal: Normal digits and nails by inspection. No clubbing.   Neurologic Examination  Mental status/Cognition: obtunded, no response to voice or noxious stimuli Speech/language: mute, no speech, no attempts to communicate Cranial nerves:   CN II Pupils equal and reactive to light, unable to assess for VF deficits.   CN III,IV,VI Dyscoonjugate gaze, no nystagmus, no gaze deviation.   CN V Corneals absent BL   CN VII Facial diplegia   CN VIII Does not turn head towards speech   CN IX & X No cough, no gag   CN XI Head midline   CN XII Does not protrude tongue on command.   Sensory/Motor:   Muscle bulk: slightly increased in RUE No spontaneous movement noted in any of the extremities. Does not localize to proximal pinch in any of the extremities.  Coordination/Complex Motor:  - Unable to assess  Labs/Imaging/Neurodiagnostic studies   CBC: No results for input(s): "WBC", "NEUTROABS", "HGB", "HCT", "MCV", "PLT" in the last 168 hours. Basic Metabolic Panel:  Lab Results  Component  Value Date   NA 140 02/07/2022   K 4.4 02/07/2022   CO2 23 02/07/2022   GLUCOSE 88 02/07/2022   BUN 16 02/07/2022   CREATININE 0.63 02/07/2022   CALCIUM 10.2 02/07/2022   GFRNONAA >60 02/07/2022   Lipid Panel:  Lab Results  Component Value Date   LDLCALC 98 02/04/2022   HgbA1c:  Lab Results  Component Value Date   HGBA1C 4.7 (L) 02/02/2022   Urine Drug Screen:     Component Value Date/Time   LABOPIA NONE DETECTED 02/07/2022 0305   COCAINSCRNUR NONE DETECTED 02/07/2022 0305   LABBENZ NONE DETECTED 02/07/2022 0305   AMPHETMU NONE DETECTED 02/07/2022 0305   THCU NONE DETECTED 02/07/2022 0305   LABBARB NONE DETECTED 02/07/2022 0305    Alcohol Level     Component Value Date/Time   ETH <10 02/02/2022 2136   INR  Lab Results  Component Value Date   INR 1.2 01/23/2021   APTT No results found for: "APTT" AED levels: No results found for: "PHENYTOIN", "ZONISAMIDE", "LAMOTRIGINE", "LEVETIRACETA"  CT Head without contrast(Personally reviewed): No acute abnormalities. Chronic R MCA encephalomalacia.  CT angio Head and Neck with contrast(Personally reviewed): No LVO.  MRI Brain: pending  Neurodiagnostics cEEG:  pending  ASSESSMENT   Prescious Widener is a 78 y.o. 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 She had a GTC seizure that was witnessed by EMS enroute with forced L gaze deviation, outstertched arms and given versed. She was obtunded and being bagged on arrival with no  clinical seizure activity noted.  Suspect seizure secondary to known cortical strokes.  RECOMMENDATIONS  - Keppra 3000 mg  IV once load, followed by Keppra 500mg  BID - cEEG - MRI Brain without contrast. - Seizure precautions. - Further AEDs based on cEEG. ______________________________________________________________________  This patient is critically ill and at significant risk of neurological worsening, death and care requires constant monitoring of vital signs, hemodynamics,respiratory and cardiac monitoring, neurological assessment, discussion with family, other specialists and medical decision making of high complexity. I spent 50 minutes of neurocritical care time  in the care of  this patient. This was time spent independent of any time provided by nurse practitioner or PA.  Erick Blinks [facility] 06/14/2023  5:42 AM   Signed, Erick Blinks, MD [facility]

## 2023-06-14 NOTE — Progress Notes (Signed)
 LTM EEG hooked up and running - no initial skin breakdown - push button tested - MRI compatible leads - [facility] NOT monitoring patient in ED.

## 2023-06-14 NOTE — ED Provider Notes (Addendum)
 [place] EMERGENCY DEPARTMENT AT [facility] Provider Note   CSN: [id number] Arrival date & time: 06/14/23  2694  An emergency department physician performed an initial assessment on this suspected stroke patient at 0334.  History  Chief Complaint  Patient presents with  . Code Stroke    Olivia Ross is a 78 y.o. female.  HPI     This is a 78 year old female who presented initially as a code stroke.  Per EMS they were called out for altered mental status and presumed UTI.  However upon their arrival they noted some aphasia and some gaze deviation.  They called a code stroke.  Last seen normal around 2 PM.  En route, patient had seizure-like activity and went apneic.  Upon arrival she is being bagged.  Per EMS she is full code.  Home Medications Prior to Admission medications   Medication Sig Start Date End Date Taking? Authorizing Provider  acetaminophen (TYLENOL) 500 MG tablet Take 500 mg by mouth every 6 (six) hours as needed for moderate pain.    [provider]  amLODipine (NORVASC) 5 MG tablet Take 1 tablet (5 mg total) by mouth daily. 08/28/21   Burnadette Pop, MD  atorvastatin (LIPITOR) 80 MG tablet Take 1 tablet (80 mg total) by mouth daily. 08/29/21   Burnadette Pop, MD  carboxymethylcellulose (REFRESH PLUS) 0.5 % SOLN Place 1 drop into both eyes 3 (three) times daily as needed (dry eyes).    [provider]  dicyclomine (BENTYL) 10 MG capsule Take 10 mg by mouth daily as needed for spasms. 07/24/20   [provider]  ezetimibe (ZETIA) 10 MG tablet Take 1 tablet (10 mg total) by mouth daily. 02/06/22   Azucena Fallen, MD  letrozole [facility]) 2.5 MG tablet Take 2.5 mg by mouth daily. 12/12/20   [provider]  levETIRAcetam (KEPPRA) 250 MG tablet Take 1 tablet (250 mg total) by mouth 2 (two) times daily. 02/06/22   Fayrene Helper, PA-C  loperamide (IMODIUM A-D) 2 MG tablet Take 2 mg by mouth 4 (four) times daily as needed for  diarrhea or loose stools.    [provider]  magnesium oxide (MAG-OX) 400 (240 Mg) MG tablet Take 0.5 tablets (200 mg total) by mouth 2 (two) times daily. 12/22/20   Regalado, Belkys A, MD  Multiple Vitamin (MULTIVITAMIN WITH MINERALS) TABS tablet Take 1 tablet by mouth daily. Centrum    [provider]  valsartan (DIOVAN) 160 MG tablet Take 160 mg by mouth daily. 12/12/20   [provider]  vitamin B-12 100 MCG tablet Take 1 tablet (100 mcg total) by mouth daily. 12/23/20   Regalado, Jon Billings A, MD      Allergies    Diphth-acell pertussis-tetanus, Dtap-hepatitis b recomb-ipv, Norco [hydrocodone-acetaminophen], and Percocet [oxycodone-acetaminophen]    Review of Systems   Review of Systems  Unable to perform ROS: Mental status change    Physical Exam Updated Vital Signs BP (!) 146/96   Pulse (!) 114   Temp (!) 91.5 F (33.1 C)   Resp 16   Ht 1.6 m (5\' 3" )   Wt 41.6 kg   SpO2 100%   BMI 16.25 kg/m  Physical Exam Vitals and nursing note reviewed.  Constitutional:      Appearance: She is well-developed. She is ill-appearing.     Comments: Apneic, minimally responsive, does not respond to pain, no gag reflex  HENT:     Head: Normocephalic and atraumatic.     Mouth/Throat:  Mouth: Mucous membranes are dry.  Eyes:     Pupils: Pupils are equal, round, and reactive to light.     Comments: Pupils threes and reactive bilaterally, no obvious nystagmus or gaze deviation  Cardiovascular:     Rate and Rhythm: Regular rhythm. Tachycardia present.     Heart sounds: Normal heart sounds.  Pulmonary:     Effort: No respiratory distress.     Breath sounds: No wheezing.     Comments: Assisted respirations Abdominal:     Palpations: Abdomen is soft.  Musculoskeletal:     Cervical back: Neck supple.  Skin:    General: Skin is warm and dry.  Neurological:     Mental Status: She is alert.     Comments: GCS 3  Psychiatric:     Comments: Unable to assess      ED Results / Procedures / Treatments   Labs (all labs ordered are listed, but only abnormal results are displayed) Labs Reviewed  PROTIME-INR - Abnormal; Notable for the following components:      Result Value   Prothrombin Time 16.2 (*)    INR 1.3 (*)    All other components within normal limits  CBC - Abnormal; Notable for the following components:   RBC 2.96 (*)    Hemoglobin 9.8 (*)    HCT 28.4 (*)    Platelets 97 (*)    All other components within normal limits  COMPREHENSIVE METABOLIC PANEL - Abnormal; Notable for the following components:   Sodium 133 (*)    Potassium 2.3 (*)    Glucose, Bld 105 (*)    Calcium 7.8 (*)    Total Protein 3.8 (*)    Albumin 1.9 (*)    All other components within normal limits  RAPID URINE DRUG SCREEN, HOSP PERFORMED - Abnormal; Notable for the following components:   Benzodiazepines POSITIVE (*)    All other components within normal limits  URINALYSIS, ROUTINE W REFLEX MICROSCOPIC - Abnormal; Notable for the following components:   Color, Urine COLORLESS (*)    All other components within normal limits  I-STAT CHEM 8, ED - Abnormal; Notable for the following components:   Sodium 134 (*)    Potassium 2.9 (*)    Chloride 97 (*)    All other components within normal limits  I-STAT CG4 LACTIC ACID, ED - Abnormal; Notable for the following components:   Lactic Acid, Venous 3.0 (*)    All other components within normal limits  I-STAT ARTERIAL BLOOD GAS, ED - Abnormal; Notable for the following components:   pH, Arterial 7.515 (*)    pO2, Arterial 479 (*)    Bicarbonate 29.0 (*)    Acid-Base Excess 5.0 (*)    Potassium 2.5 (*)    HCT 35.0 (*)    Hemoglobin 11.9 (*)    All other components within normal limits  CULTURE, BLOOD (ROUTINE X 2)  CULTURE, BLOOD (ROUTINE X 2)  ETHANOL  APTT  DIFFERENTIAL    EKG EKG Interpretation Date/Time:  Saturday June 14 2023 03:54:08 EDT Ventricular Rate:  101 PR Interval:  149 QRS  Duration:  68 QT Interval:  354 QTC Calculation: 459 R Axis:   80  Text Interpretation: Sinus tachycardia with irregular rate Anteroseptal infarct, old Borderline repolarization abnormality Confirmed by Ross Marcus ([id number]) on 06/14/2023 4:22:36 AM  Radiology CT ANGIO HEAD NECK W WO CM (CODE STROKE) Result Date: 06/14/2023 CLINICAL DATA:  Stroke workup EXAM: CT ANGIOGRAPHY HEAD AND NECK WITH AND WITHOUT  CONTRAST TECHNIQUE: Multidetector CT imaging of the head and neck was performed using the standard protocol during bolus administration of intravenous contrast. Multiplanar CT image reconstructions and MIPs were obtained to evaluate the vascular anatomy. Carotid stenosis measurements (when applicable) are obtained utilizing NASCET criteria, using the distal internal carotid diameter as the denominator. RADIATION DOSE REDUCTION: This exam was performed according to the departmental dose-optimization program which includes automated exposure control, adjustment of the mA and/or kV according to patient size and/or use of iterative reconstruction technique. CONTRAST:  75mL OMNIPAQUE IOHEXOL 350 MG/ML SOLN COMPARISON:  MRA 02/03/2022 FINDINGS: CTA NECK FINDINGS Aortic arch: Atheromatous plaque without acute finding. 3 vessel branching Right carotid system: Diffuse atheromatous wall thickening with fairly bulky mainly calcified plaque at the bifurcation not causing flow reducing stenosis or ulceration. Left carotid system: Diffuse atheromatous wall thickening of the common carotid with accentuated mainly calcified plaque at the bifurcation not causing flow reducing stenosis. No ulceration or beading. Vertebral arteries: Proximal subclavian atherosclerosis without significant stenosis. The vertebral arteries are smoothly contoured and diffusely patent to the dura. Atheromatous calcification at the bilateral vertebral origin. Skeleton: Paucity of fat with hazy intermuscular fat usually seen and cachexia. No acute  osseous finding. Other neck: Endotracheal and enteric tubes are in unremarkable position in the neck. Upper chest: No acute finding Review of the MIP images confirms the above findings CTA HEAD FINDINGS Anterior circulation: Heavily calcified carotid siphons which are diffusely attenuated. No progression from prior MRA suspected; no focal advanced stenosis. Generalized atheromatous irregularity of intracranial branches. The right MCA is chronically occluded with anterior temporal branch or anterior division branch showing faint enhancement. Left M2 moderate stenosis based on coronal thick MIPS. No evidence of aneurysm or emergent large vessel occlusion. Posterior circulation: The vertebral and basilar arteries show atheromatous undulation but no flow reducing stenosis. No major branch occlusion, beading, or aneurysm. Venous sinuses: Unremarkable for arterial scan. Anatomic variants: None significant Review of the MIP images confirms the above findings IMPRESSION: 1. No emergent finding. 2. Atherosclerosis which severely affects intracranial circulation. 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 Electronically Signed   By: Tiburcio Pea M.D.   On: 06/14/2023 04:31   CT HEAD CODE STROKE WO CONTRAST Result Date: 06/14/2023 CLINICAL DATA:  Code stroke.  Aphasia and left-sided gaze. EXAM: CT HEAD WITHOUT CONTRAST TECHNIQUE: Contiguous axial images were obtained from the base of the skull through the vertex without intravenous contrast. RADIATION DOSE REDUCTION: This exam was performed according to the departmental dose-optimization program which includes automated exposure control, adjustment of the mA and/or kV according to patient size and/or use of iterative reconstruction technique. COMPARISON:  01/31/2022 FINDINGS: Brain: No evidence of acute infarction, hemorrhage, hydrocephalus, extra-axial  collection or mass lesion/mass effect. Advanced chronic ischemic injury with bilateral cerebellar, large posterior division right MCA, anterior left frontal and posterior left frontal cortex infarcts. Perforator infarcts seen in the bilateral basal ganglia and deep white matter tracks. Vascular: No hyperdense vessel or unexpected calcification. Skull: Normal. Negative for fracture or focal lesion. Sinuses/Orbits: No acute finding. Other: Cachectic appearance of soft tissue planes in the upper neck. Prelim sent in epic chat. IMPRESSION: 1. No acute or interval finding. 2. 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 Electronically Signed   By: Tiburcio Pea M.D.   On: 06/14/2023 04:22   DG Chest Portable 1 View Result Date:  06/14/2023 CLINICAL DATA:  Intubation EXAM: PORTABLE CHEST 1 VIEW COMPARISON:  02/03/2022 FINDINGS: Endotracheal tube with tip between the clavicular heads and carina. An enteric tube at least reaches the distal stomach. Lucency over the upper abdomen likely related to stomach, there is distortion from overlapping skin folds. There is no edema, consolidation, effusion, or pneumothorax. Clips overlap the bilateral chest. Normal heart size and mediastinal contours. IMPRESSION: Located endotracheal and orogastric tubes. Electronically Signed   By: Tiburcio Pea M.D.   On: 06/14/2023 04:15    Procedures Procedure Name: Intubation Date/Time: 06/14/2023 4:30 AM  Performed by: Shon Baton, MDPre-anesthesia Checklist: Patient identified, Patient being monitored, Emergency Drugs available, Timeout performed and Suction available Oxygen Delivery Method: Non-rebreather mask Preoxygenation: Pre-oxygenation with 100% oxygen Induction Type: Rapid sequence Ventilation: Mask ventilation without difficulty Laryngoscope Size: Glidescope and 3 Grade View: Grade I Tube size: 7.5 mm Number of attempts: 1 Placement Confirmation: ETT inserted through vocal  cords under direct vision, CO2 detector and Breath sounds checked- equal and bilateral Secured at: 22 cm Tube secured with: ETT holder Dental Injury: Teeth and Oropharynx as per pre-operative assessment     .Critical Care  Performed by: Shon Baton, MD Authorized by: Shon Baton, MD   Critical care provider statement:    Critical care time (minutes):  65   Critical care was necessary to treat or prevent imminent or life-threatening deterioration of the following conditions:  Metabolic crisis and CNS failure or compromise   Critical care was time spent personally by me on the following activities:  Development of treatment plan with patient or surrogate, discussions with consultants, evaluation of patient's response to treatment, examination of patient, ordering and review of laboratory studies, ordering and review of radiographic studies, ordering and performing treatments and interventions, pulse oximetry, re-evaluation of patient's condition and review of old charts     Medications Ordered in ED Medications  propofol (DIPRIVAN) 1000 MG/100ML infusion (5 mcg/kg/min  41.6 kg Intravenous New Bag/Given 06/14/23 0426)  potassium chloride 10 mEq in 100 mL IVPB (10 mEq Intravenous New Bag/Given 06/14/23 0545)  levETIRAcetam (KEPPRA) IVPB 500 mg/100 mL premix (has no administration in time range)  lactated ringers infusion (has no administration in time range)  ceFEPIme (MAXIPIME) 2 g in sodium chloride 0.9 % 100 mL IVPB (has no administration in time range)  metroNIDAZOLE (FLAGYL) IVPB 500 mg (has no administration in time range)  vancomycin (VANCOCIN) IVPB 1000 mg/200 mL premix (has no administration in time range)  etomidate (AMIDATE) injection 20 mg (20 mg Intravenous Given 06/14/23 0346)  succinylcholine (ANECTINE) syringe 150 mg (150 mg Intravenous Given 06/14/23 0346)  levETIRAcetam (KEPPRA) 3,000 mg in sodium chloride 0.9 % 250 mL IVPB (0 mg Intravenous Stopped 06/14/23 0444)   iohexol (OMNIPAQUE) 350 MG/ML injection 75 mL (75 mLs Intravenous Contrast Given 06/14/23 0418)  lactated ringers bolus 1,000 mL (1,000 mLs Intravenous New Bag/Given 06/14/23 0540)    ED Course/ Medical Decision Making/ A&P Clinical Course as of 06/14/23 0647  Sat Jun 14, 2023  0613 Attempted to update daughter.  Went straight to [employer].  Noele, [place] (Daughter) [phone] [CH]  [phone] Daughter updated over the phone.  She is on her way. [CH]    Clinical Course User Index [CH] Olly Shiner, Mayer Masker, MD                                 Medical Decision Making Amount and/or Complexity  of Data Reviewed Labs: ordered. Radiology: ordered.  Risk Prescription drug management. Decision regarding hospitalization.   This patient presents to the ED for concern of altered mental status, code stroke, this involves an extensive number of treatment options, and is a complaint that carries with it a high risk of complications and morbidity.  I considered the following differential and admission for this acute, potentially life threatening condition.  The differential diagnosis includes CVA, encephalopathy, seizure, infection, metabolic derangement  MDM:    This is a 78 year old female who presents initially as a code stroke.  Had a seizure en route.  History of seizures.  Upon arrival she is apneic and being bagged.  This is either related to Versed or her being postictal.  Minimal gag reflex.  Neurology at the bedside.  Decision made to intubate as patient is not protecting her own airway.  Patient intubated and sent to CT scan.  CT scan without any acute findings.  Multiple chronic findings.  Will do a continuous EEG from neurology standpoint.  Temp Foley was placed and she has a core temperature of 91.5.  For this reason additional testing was added including lactic, blood cultures.  Labs notable for potassium of 2.3.  This was replaced.  No significant leukocytosis.  Urinalysis not consistent  with infection.  Chest x-ray without evidence of pneumonia.  Patient was given broad-spectrum antibiotics to cover for possible sepsis as unclear etiology of her hypothermia.  Discussed with critical care who will admit.  (Labs, imaging, consults)  Labs: I Ordered, and personally interpreted labs.  The pertinent results include: CBC, BMP, lactate, blood cultures, urinalysis  Imaging Studies ordered: I ordered imaging studies including CT head, chest x-ray, MRI I independently visualized and interpreted imaging. I agree with the radiologist interpretation  Additional history obtained from EMS.  External records from outside source obtained and reviewed including prior evaluations  Cardiac Monitoring: .The patient was maintained on a cardiac monitor.  If on the cardiac monitor, I personally viewed and interpreted the cardiac monitored which showed an underlying rhythm of: Sinus  Reevaluation: After the interventions noted above, I reevaluated the patient and found that they have :stayed the same  Social Determinants of Health: . lives with family  Disposition: Admit  Co morbidities that complicate the patient evaluation . Past Medical History:  Diagnosis Date  . Breast cancer (HCC)   . CVA (cerebral vascular accident) (HCC)   . Dementia (HCC)   . Hypertension   . Seizure (HCC) 07/2020     Medicines Meds ordered this encounter  Medications  . etomidate (AMIDATE) injection 20 mg  . succinylcholine (ANECTINE) syringe 150 mg  . levETIRAcetam (KEPPRA) 3,000 mg in sodium chloride 0.9 % 250 mL IVPB  . propofol (DIPRIVAN) 1000 MG/100ML infusion    Kelvin Cellar L: cabinet override  . iohexol (OMNIPAQUE) 350 MG/ML injection 75 mL  . propofol (DIPRIVAN) 1000 MG/100ML infusion  . potassium chloride 10 mEq in 100 mL IVPB  . lactated ringers bolus 1,000 mL  . levETIRAcetam (KEPPRA) IVPB 500 mg/100 mL premix  . lactated ringers infusion  . ceFEPIme (MAXIPIME) 2 g in sodium chloride  0.9 % 100 mL IVPB    Antibiotic Indication::   Other Indication (list below)    Other Indication::   Unknown Source.  . metroNIDAZOLE (FLAGYL) IVPB 500 mg    Antibiotic Indication::   Other Indication (list below)    Other Indication::   Unknown Source.  . vancomycin (VANCOCIN) IVPB 1000 mg/200 mL  premix    Indication::   Other Indication (list below)    Other Indication::   Unknown Source.    I have reviewed the patients home medicines and have made adjustments as needed  Problem List / ED Course: Problem List Items Addressed This Visit   None Visit Diagnoses       Seizure (HCC)    -  Primary   Relevant Medications   levETIRAcetam (KEPPRA) 3,000 mg in sodium chloride 0.9 % 250 mL IVPB (Completed)   levETIRAcetam (KEPPRA) IVPB 500 mg/100 mL premix (Start on 06/14/2023 10:00 AM)     Hypokalemia                       Final Clinical Impression(s) / ED Diagnoses Final diagnoses:  Seizure (HCC)  Hypokalemia    Rx / DC Orders ED Discharge Orders     None         Hassan Blackshire, Mayer Masker, MD 06/14/23 0600    Shon Baton, MD 06/14/23 [phone]

## 2023-06-14 NOTE — Sepsis Progress Note (Signed)
 Notified bedside nurse of need to draw repeat lactic acid.

## 2023-06-14 NOTE — Progress Notes (Signed)
 06/14/2023 Waking up pretty wild moving everything. Okay to try extubation and we can consider MRI depending on trajectory.  Myrla Halsted MD PCCM

## 2023-06-14 NOTE — ED Notes (Signed)
 Bier hugger applied to pt for warmth

## 2023-06-15 ENCOUNTER — Inpatient Hospital Stay (HOSPITAL_COMMUNITY)

## 2023-06-15 DIAGNOSIS — R569 Unspecified convulsions: Secondary | ICD-10-CM | POA: Diagnosis not present

## 2023-06-15 DIAGNOSIS — G40409 Other generalized epilepsy and epileptic syndromes, not intractable, without status epilepticus: Secondary | ICD-10-CM

## 2023-06-15 LAB — CBC
HCT: 31.9 % — ABNORMAL LOW (ref 36.0–46.0)
Hemoglobin: 10.7 g/dL — ABNORMAL LOW (ref 12.0–15.0)
MCH: 32.4 pg (ref 26.0–34.0)
MCHC: 33.5 g/dL (ref 30.0–36.0)
MCV: 96.7 fL (ref 80.0–100.0)
Platelets: 120 10*3/uL — ABNORMAL LOW (ref 150–400)
RBC: 3.3 MIL/uL — ABNORMAL LOW (ref 3.87–5.11)
RDW: 14.7 % (ref 11.5–15.5)
WBC: 7.7 10*3/uL (ref 4.0–10.5)
nRBC: 0 % (ref 0.0–0.2)

## 2023-06-15 LAB — COMPREHENSIVE METABOLIC PANEL
ALT: 25 U/L (ref 0–44)
AST: 31 U/L (ref 15–41)
Albumin: 2.7 g/dL — ABNORMAL LOW (ref 3.5–5.0)
Alkaline Phosphatase: 64 U/L (ref 38–126)
Anion gap: 5 (ref 5–15)
BUN: 8 mg/dL (ref 8–23)
CO2: 30 mmol/L (ref 22–32)
Calcium: 9.3 mg/dL (ref 8.9–10.3)
Chloride: 108 mmol/L (ref 98–111)
Creatinine, Ser: 0.49 mg/dL (ref 0.44–1.00)
GFR, Estimated: >60 mL/min (ref >60)
Glucose, Bld: 92 mg/dL (ref 70–99)
Potassium: 4.4 mmol/L (ref 3.5–5.1)
Sodium: 143 mmol/L (ref 135–145)
Total Bilirubin: 0.6 mg/dL (ref 0.0–1.2)
Total Protein: 4.9 g/dL — ABNORMAL LOW (ref 6.5–8.1)

## 2023-06-15 LAB — GLUCOSE, CAPILLARY
Glucose-Capillary: 104 mg/dL — ABNORMAL HIGH (ref 70–99)
Glucose-Capillary: 114 mg/dL — ABNORMAL HIGH (ref 70–99)
Glucose-Capillary: 91 mg/dL (ref 70–99)
Glucose-Capillary: 91 mg/dL (ref 70–99)
Glucose-Capillary: 92 mg/dL (ref 70–99)
Glucose-Capillary: 93 mg/dL (ref 70–99)
Glucose-Capillary: 94 mg/dL (ref 70–99)
Glucose-Capillary: 98 mg/dL (ref 70–99)

## 2023-06-15 LAB — PHOSPHORUS: Phosphorus: 1.7 mg/dL — ABNORMAL LOW (ref 2.5–4.6)

## 2023-06-15 LAB — MAGNESIUM: Magnesium: 1.8 mg/dL (ref 1.7–2.4)

## 2023-06-15 MED ORDER — DEXTROSE-SODIUM CHLORIDE 10-0.45 % IV SOLN
INTRAVENOUS | Status: DC
  Filled 2023-06-15 (×9): qty 1000

## 2023-06-15 MED ORDER — SODIUM PHOSPHATES 45 MMOLE/15ML IV SOLN
30.0000 mmol | Freq: Once | INTRAVENOUS | Status: AC
  Administered 2023-06-15: 30 mmol via INTRAVENOUS
  Filled 2023-06-15: qty 10

## 2023-06-15 MED ORDER — K PHOS MONO-SOD PHOS DI & MONO 155-852-130 MG PO TABS
500.0000 mg | ORAL_TABLET | ORAL | Status: DC
  Filled 2023-06-15 (×3): qty 2

## 2023-06-15 MED ORDER — GADOBUTROL 1 MMOL/ML IV SOLN
4.0000 mL | Freq: Once | INTRAVENOUS | Status: AC | PRN
  Administered 2023-06-15: 4 mL via INTRAVENOUS

## 2023-06-15 MED ORDER — MAGNESIUM SULFATE 2 GM/50ML IV SOLN
2.0000 g | Freq: Once | INTRAVENOUS | Status: AC
  Administered 2023-06-15: 2 g via INTRAVENOUS
  Filled 2023-06-15: qty 50

## 2023-06-15 NOTE — Procedures (Addendum)
 Patient Name: Olivia Ross  MRN: [id number]  Epilepsy Attending: Charlsie Quest  Referring Physician/Provider: Erick Blinks, MD  Duration: 06/14/2023 7829 to 06/15/2023 0944  Patient history: 78 y.o. 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 She had a GTC seizure that was witnessed by EMS enroute with forced L gaze deviation, outstertched arms and given versed. She was obtunded and being bagged on arrival with no clinical seizure activity noted. EEG to evaluate for seizure  Level of alertness:  lethargic   AEDs during EEG study: LEV, propofol  Technical aspects: This EEG study was done with scalp electrodes positioned according to the 10-20 International system of electrode placement. Electrical activity was reviewed with band pass filter of 1-70Hz , sensitivity of 7 uV/mm, display speed of 76mm/sec with a 60Hz  notched filter applied as appropriate. EEG data were recorded continuously and digitally stored.  Video monitoring was available and reviewed as appropriate.  Description: EEG showed continuous generalized polymorphic 3 to 6 Hz theta-delta slowing, at times with triphasic morphology. Hyperventilation and photic stimulation were not performed.     ABNORMALITY - Continuous slow, generalized  IMPRESSION: This study is suggestive of moderate diffuse encephalopathy. No seizures or definite epileptiform discharges were seen throughout the recording.  Tineka Uriegas [name]

## 2023-06-15 NOTE — Progress Notes (Signed)
 eLink Physician-Brief Progress Note Patient Name: Olivia Ross DOB: 12-20-1945 MRN: [id number]   Date of Service  06/15/2023  HPI/Events of Note  Still with hypoglycemic episode on D5LR 100 Na 141  eICU Interventions  Switch to D10 0.45% at 100 /hr Goal of care discussion in AM     Intervention Category Intermediate Interventions: Other:  Darl Pikes 06/15/2023, 12:33 AM

## 2023-06-15 NOTE — Progress Notes (Signed)
 LTM EEG discontinued - no skin breakdown at [facility].

## 2023-06-15 NOTE — Plan of Care (Signed)
  Problem: Fluid Volume: Goal: Ability to maintain a balanced intake and output will improve Outcome: Progressing   Problem: Skin Integrity: Goal: Risk for impaired skin integrity will decrease Outcome: Progressing   Problem: Tissue Perfusion: Goal: Adequacy of tissue perfusion will improve Outcome: Progressing   Problem: Pain Managment: Goal: General experience of comfort will improve and/or be controlled Outcome: Progressing

## 2023-06-15 NOTE — Progress Notes (Signed)
 [facility] ADULT ICU REPLACEMENT PROTOCOL   The patient does apply for the [facility] Adult ICU Electrolyte Replacment Protocol based on the criteria listed below:   1.Exclusion criteria: TCTS, ECMO, Dialysis, and Myasthenia Gravis patients 2. Is GFR >/= 30 ml/min? Yes.    Patient's GFR today is >60 3. Is SCr </= 2? Yes.   Patient's SCr is 0.49 mg/dL 4. Did SCr increase >/= 0.5 in 24 hours? No. 5.Pt's weight >40kg  Yes.   6. Abnormal electrolyte(s): Phos 1.7, Mag 1.8  7. Electrolytes replaced per protocol 8.  Call MD STAT for K+ </= 2.5, Phos </= 1, or Mag </= 1 Physician:  Dr. Larene Beach, Lilia Argue 06/15/2023 4:56 AM

## 2023-06-15 NOTE — Progress Notes (Addendum)
   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  Family thought she may have UTI.  Patient initially alert then had a seizure en route to hospital.  EMS gave 5mg  IM Versed, but then she became apneic and she was intubated (Etomidate and Succinylcholine) in the ED.  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 rate 10, height 5\' 3"  (1.6 m), weight 43.1 kg, SpO2 100%.    Vent Mode: PRVC FiO2 (%):  [40 %] 40 % Set Rate:  [18 bmp] 18 bmp Vt Set:  [340 mL] 340 mL PEEP:  [5 cmH20] 5 cmH20 Plateau Pressure:  [15 cmH20] 15 cmH20   Intake/Output Summary (Last 24 hours) at 06/15/2023 0857 Last data filed at 06/15/2023 0700 Gross per 24 hour  Intake 5521.37 ml  Output 5900 ml  Net -378.63 ml   Filed Weights   06/14/23 0400 06/14/23 0420 06/15/23 0500  Weight: 41.6 kg 41.6 kg 43.1 kg    Examination: Cachetic woman in NAD Nods head appropriately Lungs clear, heart sounds regular Marked muscle wasting L arm largely contracted. Intermittently follows commands w/ all extremities but L leg is stronger than R   Phos being repleted Plts better   Resolved Hospital Problem list   N/a  Assessment & Plan:  Seizure, inability  to protect airway- resolved Vascular dementia- QoL okay per daughter Severe muscle wasting, unintentional weight loss, hx of breast cancer- r/o chronic aspiration, r/o new or recurrent malignancy  - Spoke with neuro: DC EEG, MRI nonurgent, they will touch base with family/patient tomorrow - SLP consult - CT A/P r/o cancer - PMT consult given profound weight loss and severe malnutrition to discuss risks/benefits of artificial feeding on quality and quantity of life  Stable for progressive, appreciate [facility] taking over starting 06/16/23 Myrla Halsted MD PCCM

## 2023-06-15 NOTE — Evaluation (Signed)
 Clinical/Bedside Swallow Evaluation Patient Details  Name: Olivia Ross MRN: [id number] Date of Birth: 07/15/1945  Today's Date: 06/15/2023 Time: SLP Start Time (ACUTE ONLY): 1117 SLP Stop Time (ACUTE ONLY): 1128 SLP Time Calculation (min) (ACUTE ONLY): 11 min  Past Medical History:  Past Medical History:  Diagnosis Date   Breast cancer (HCC)    CVA (cerebral vascular accident) (HCC)    Dementia (HCC)    Hypertension    Seizure (HCC) 07/2020   Past Surgical History: History reviewed. No pertinent surgical history. 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  Family thought she may have UTI.  Patient initially alert then had a seizure en route to hospital.  EMS gave 5mg  IM Versed, but then she became apneic and she was intubated (Etomidate and Succinylcholine) in the ED.  She was diagnosed with hypoxic respiratory failure requiring intubation, seizure disorder, lactic acidosis and hypokalemia.  Most recent CT of the head was showing no acute findings with multiple chronic infarcts.    Assessment / Plan / Recommendation  Clinical Impression  Clinical swallowing evaluation was completed using ice chips, thin liquids via spoon and pureed material. RN approved patient for PO intake.  Cranial nerve exam was attempted and unable to be completed due to her difficulty follow commands.  Her mentation is currently her biggest risk factor for aspiration.  Poor control of bolus trials especially given thin liquids with significant anterior loss seen following liquid trials.  Slow A/P transport was noted as well as oral residue follow pureed material that she was not sensate to requiring oral suction to clear all material.  Given clinical presentation suggest patient remain NPO pending re assessment. SLP Visit Diagnosis: Dysphagia, unspecified (R13.10)    Aspiration Risk  Severe  aspiration risk    Diet Recommendation   NPO  Medication Administration: Via alternative means    Other  Recommendations Oral Care Recommendations: Oral care BID    Recommendations for follow up therapy are one component of a multi-disciplinary discharge planning process, led by the attending physician.  Recommendations may be updated based on patient status, additional functional criteria and insurance authorization.  Follow up Recommendations  (TBD)         Functional Status Assessment Patient has had a recent decline in their functional status and demonstrates the ability to make significant improvements in function in a reasonable and predictable amount of time.         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  Family thought she may have UTI.  Patient initially alert then had a seizure en route to hospital.  EMS gave 5mg  IM Versed, but then she became apneic and she was intubated (Etomidate and Succinylcholine) in the ED.  She was diagnosed with hypoxic respiratory failure requiring intubation, seizure disorder, lactic acidosis and hypokalemia.  Most recent CT of the head was showing no acute findings with multiple chronic infarcts. Type of Study: Bedside Swallow Evaluation Previous Swallow Assessment: None noted at [facility]. Diet Prior to this Study: NPO Temperature Spikes Noted: No Respiratory Status: Room  air History of Recent Intubation: Yes Total duration of intubation (days):  (9 hours) Date extubated: 06/14/23 Behavior/Cognition: Alert;Doesn't follow directions Oral Cavity Assessment: Within Functional Limits Oral Care Completed by SLP: No    Oral/Motor/Sensory Function Overall  Oral Motor/Sensory Function: Other (comment) (Unable to assess)   Ice Chips Ice chips: Impaired Presentation: Spoon Oral Phase Impairments: Impaired mastication Oral Phase Functional Implications: Prolonged oral transit   Thin Liquid Thin Liquid: Impaired Presentation: Spoon Oral Phase Impairments: Impaired mastication;Poor awareness of bolus Oral Phase Functional Implications: Right anterior spillage    Nectar Thick Nectar Thick Liquid: Not tested   Honey Thick Honey Thick Liquid: Not tested   Puree Puree: Impaired Presentation: Spoon Oral Phase Impairments: Poor awareness of bolus;Impaired mastication Oral Phase Functional Implications: Prolonged oral transit;Oral residue   Solid     Solid: Not tested     Dimas Aguas, MA, CCC-SLP Acute Rehab SLP [phone]  Fleet Contras 06/15/2023,11:35 AM

## 2023-06-15 NOTE — Plan of Care (Signed)
    Referral received for Olivia Ross :goals of care discussion. Chart reviewed and updates received from RN. Patient assessed and is unable to engage appropriately in discussions. Attempted to contact patient's daughter Olivia Ross. Unable to reach. Voicemail left with contact information given.   PMT will re-attempt to contact family at a later time/date. Detailed note and recommendations to follow once GOC has been completed.   Thank you for your referral and allowing PMT to assist in Ms. Olivia Ross's care.   Richardson Dopp, [facility] Palliative Medicine Team  Team Phone # [phone]   NO CHARGE

## 2023-06-15 NOTE — Plan of Care (Signed)
 Neurology plan of care   Brief summary: Olivia Ross is a 78 y.o. 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 She had a GTC seizure that was witnessed by EMS enroute with forced L gaze deviation, outstertched arms and was given versed. She was intubated on arrival to the ED for airway protection. cEEG showed continuous generalized slowing without epileptiform abnormalities. She has now been extubated and has had no further clinical or electrographic seizures. Suspect seizure 2/2 known cortical strokes although acute ischemia cannot be ruled out without MRI.  Updated recommendations: - Continue keppra 500mg  bid - D/c LTM EEG - MRI brain wwo - Seizure precautions - Ativan 2mg  IV and page neuro if seizure - Neurology will f/u tomorrow to discuss MRI results with family  D/w Dr. Janeece Agee, MD [facility] [phone]  If 7pm- 7am, please page neurology on call as listed in AMION.

## 2023-06-16 DIAGNOSIS — G40409 Other generalized epilepsy and epileptic syndromes, not intractable, without status epilepticus: Secondary | ICD-10-CM | POA: Diagnosis not present

## 2023-06-16 DIAGNOSIS — Z515 Encounter for palliative care: Secondary | ICD-10-CM

## 2023-06-16 DIAGNOSIS — F0390 Unspecified dementia without behavioral disturbance: Secondary | ICD-10-CM | POA: Diagnosis not present

## 2023-06-16 DIAGNOSIS — Z7189 Other specified counseling: Secondary | ICD-10-CM | POA: Diagnosis not present

## 2023-06-16 DIAGNOSIS — E876 Hypokalemia: Secondary | ICD-10-CM | POA: Diagnosis not present

## 2023-06-16 DIAGNOSIS — R1312 Dysphagia, oropharyngeal phase: Secondary | ICD-10-CM

## 2023-06-16 DIAGNOSIS — G4089 Other seizures: Secondary | ICD-10-CM | POA: Diagnosis not present

## 2023-06-16 LAB — GLUCOSE, CAPILLARY
Glucose-Capillary: 107 mg/dL — ABNORMAL HIGH (ref 70–99)
Glucose-Capillary: 110 mg/dL — ABNORMAL HIGH (ref 70–99)
Glucose-Capillary: 116 mg/dL — ABNORMAL HIGH (ref 70–99)
Glucose-Capillary: 150 mg/dL — ABNORMAL HIGH (ref 70–99)
Glucose-Capillary: 158 mg/dL — ABNORMAL HIGH (ref 70–99)

## 2023-06-16 LAB — PHOSPHORUS: Phosphorus: 2.6 mg/dL (ref 2.5–4.6)

## 2023-06-16 LAB — COMPREHENSIVE METABOLIC PANEL
ALT: 23 U/L (ref 0–44)
AST: 28 U/L (ref 15–41)
Albumin: 2.8 g/dL — ABNORMAL LOW (ref 3.5–5.0)
Alkaline Phosphatase: 58 U/L (ref 38–126)
Anion gap: 9 (ref 5–15)
BUN: 8 mg/dL (ref 8–23)
CO2: 26 mmol/L (ref 22–32)
Calcium: 8.9 mg/dL (ref 8.9–10.3)
Chloride: 104 mmol/L (ref 98–111)
Creatinine, Ser: 0.53 mg/dL (ref 0.44–1.00)
GFR, Estimated: >60 mL/min (ref >60)
Glucose, Bld: 109 mg/dL — ABNORMAL HIGH (ref 70–99)
Potassium: 3 mmol/L — ABNORMAL LOW (ref 3.5–5.1)
Sodium: 139 mmol/L (ref 135–145)
Total Bilirubin: 0.7 mg/dL (ref 0.0–1.2)
Total Protein: 5.3 g/dL — ABNORMAL LOW (ref 6.5–8.1)

## 2023-06-16 LAB — CBC
HCT: 33.7 % — ABNORMAL LOW (ref 36.0–46.0)
Hemoglobin: 11.1 g/dL — ABNORMAL LOW (ref 12.0–15.0)
MCH: 32.3 pg (ref 26.0–34.0)
MCHC: 32.9 g/dL (ref 30.0–36.0)
MCV: 98 fL (ref 80.0–100.0)
Platelets: 104 10*3/uL — ABNORMAL LOW (ref 150–400)
RBC: 3.44 MIL/uL — ABNORMAL LOW (ref 3.87–5.11)
RDW: 15 % (ref 11.5–15.5)
WBC: 4.9 10*3/uL (ref 4.0–10.5)
nRBC: 0 % (ref 0.0–0.2)

## 2023-06-16 LAB — MAGNESIUM: Magnesium: 1.9 mg/dL (ref 1.7–2.4)

## 2023-06-16 MED ORDER — POTASSIUM CHLORIDE 10 MEQ/100ML IV SOLN
10.0000 meq | INTRAVENOUS | Status: AC
  Administered 2023-06-16 (×3): 10 meq via INTRAVENOUS
  Filled 2023-06-16 (×3): qty 100

## 2023-06-16 NOTE — Consult Note (Signed)
 Consultation Note Date: 06/16/2023   Patient Name: Olivia Ross  DOB: 1945-07-27  MRN: [id number]  Age / Sex: 78 y.o., female  PCP: Stevphen Rochester, MD Referring Physician: Osvaldo Shipper, MD  Reason for Consultation: Establishing goals of care  HPI/Patient Profile: 78 y.o. 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    PMT has been consulted to assist with goals of care conversation.  Clinical Assessment and Goals of Care:  I have reviewed medical records including EPIC notes, labs and imaging, assessed the patient and then met at the bedside with patient's daughter to discuss diagnosis prognosis, GOC, EOL wishes, disposition and options.  I introduced Palliative Medicine as specialized medical care for people living with serious illness. It focuses on providing relief from the symptoms and stress of a serious illness. The goal is to improve quality of life for both the patient and the family.  We discussed a brief life review of the patient and then focused on their current illness.  I attempted to elicit values and goals of care important to the patient.    Medical History Review and Understanding:  We discussed patient's acute illness in the context of comorbidities.  Discussed risks associated with neurological insults and underlying dementia, possibility of accelerating disease progression.  Patient's daughter verbalized understanding.  Her main concern is that patient is sedated from too much Keppra.  She would like more time to see if patient will continue to improve.  Social History: Patient is originally from [facility].  She moved here around 2019 after decline in her ability to complete ADLs.  She lives with her  daughter.  Family is important to her including her brother niece and nephew.  She is a [place] and used to be involved in church with children in the youth choir.  It has been a while since she could do that, with COVID strongly impacting her social engagements.  She is also a retired Midwife.  Functional and Nutritional State: Patient had a good appetite prior to admission, following the DASH diet.  She has gradually lost weight steadily over a long time.  About 8 years.  She is incontinent and wears adult diapers, sits on the chamber pot at scheduled times as well.  She has not been able to ambulate in the past year.  Palliative Symptoms: Constipation/diarrhea Worsening anxiety/confusion when more constipated  Code Status: Concepts specific to code status, artifical feeding and hydration, and rehospitalization were considered and discussed.   Discussion: Patient's daughter would like it make it clear that she wants to give patient more time and try any interventions available to "give her a chance." She would consider de-escalation of care on a case by case situation and feels she needs to look out for her by staying positive. She understands that providers have the responsibility to be "blunt" about potential poor outcomes. She notes that she has had experiences in the past with providers being "dismissive." [name] is appreciative of any advocacy and support for the patient, agreeing that palliative involvement would be helpful. I assisted with facilitation of her conversation with neurology. We discussed SLP evaluation today and importance of ensuring patient has an acceptable quality of life. They have never discussed    Discussed the importance of continued conversation with family and the medical  providers regarding overall plan of care and treatment options, ensuring decisions are within the context of the patient's values and GOCs.   Questions and concerns were  addressed.  Hard Choices booklet left for review. The family was encouraged to call with questions or concerns.  PMT will continue to support holistically.   SUMMARY OF RECOMMENDATIONS   -Continue full code/full scope treatment -Patient's daughter would like a trial of any interventions that could prolong patient's life, then make decisions and consider next steps situationally  -Appreciate neurology having a conversation with patient's daughter today -Psychosocial and emotional support provided -PMT will follow incrementally. Will see again when I am back on service 3/21. 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  Constitutional:      General: She is not in acute distress.    Appearance: She is ill-appearing.  Cardiovascular:     Rate and Rhythm: Normal rate.  Pulmonary:     Effort: Pulmonary effort is normal.  Skin:    General: Skin is warm and dry.  Neurological:     Mental Status: She is alert. She is disoriented.  Psychiatric:        Cognition and Memory: Cognition is impaired.     Vital Signs: BP (!) 174/84 (BP Location: Left Arm)   Pulse 60   Temp (!) 97.4 F (36.3 C) (Oral)   Resp (!) 7   Ht 5\' 3"  (1.6 m)   Wt 41.4 kg   SpO2 100%   BMI 16.17 kg/m  Pain Scale: 0-10 POSS *See Group Information*: 1-Acceptable,Awake and alert Pain Score: 0-No pain   SpO2: SpO2: 100 % O2 Device:SpO2: 100 % O2 Flow Rate: .O2 Flow Rate (L/min): 1 L/min   Palliative Assessment/Data:    Total time: I spent 75 minutes in the care of the patient today in the above activities and documenting the encounter.    Kameshia Madruga Jeni Salles, PA-C  Palliative Medicine Team Team phone # [phone]  Thank you for allowing the Palliative Medicine Team to assist in the care of  this patient. Please utilize secure chat with additional questions, if there is no response within 30 minutes please call the above phone number.  Palliative Medicine Team providers are available by phone from 7am to 7pm daily and can be reached through the team cell phone.  Should this patient require assistance outside of these hours, please call the patient's attending physician.

## 2023-06-16 NOTE — Plan of Care (Signed)
   Problem: Health Behavior/Discharge Planning: Goal: Ability to manage health-related needs will improve Outcome: Not Progressing

## 2023-06-16 NOTE — Progress Notes (Signed)
 PT Cancellation Note  Patient Details Name: Aura Bibby MRN: [id number] DOB: 16-Aug-1945   Cancelled Treatment:    Reason Eval/Treat Not Completed: Patient not medically ready  Patient currently with bradycardia and RR as low as 8. Per RN, awaiting Palliative Care consult to determine goals of care. Will follow and proceed with evaluation if medically appropriate.   Jerolyn Center, PT Acute Rehabilitation Services  Office [phone]  Zena Amos 06/16/2023, 10:22 AM

## 2023-06-16 NOTE — Progress Notes (Signed)
 OT Cancellation Note  Patient Details Name: Jennfer Gassen MRN: [id number] DOB: 11/01/1945   Cancelled Treatment:    Reason Eval/Treat Not Completed: Medical issues which prohibited therapy Patient currently with bradycardia and RR as low as 8. OT speaking with RN and stating that they are awaiting family decision as far as goals of care are concerned. OT will follow back when more medically stable for evaluation, but will defer to goals of care meeting to promote best quality of life.   Pollyann Glen E. Rimas Gilham, OTR/L Acute Rehabilitation Services [phone]   Cherlyn Cushing 06/16/2023, 9:44 AM

## 2023-06-16 NOTE — Progress Notes (Signed)
 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 Family thought she may have UTI. Patient initially alert then had a seizure en route to hospital. EMS gave 5mg  IM Versed, but then she became apneic and she was intubated (Etomidate and Succinylcholine) in the ED. Patient was hospitalized to the intensive care unit.  Consultants: Neurology.  Critical care medicine.  Procedures: None    Subjective/Interval History: Patient not very communicative.  She does open her eyes look at me.  She smiled.  Goes right back to sleep.    Assessment/Plan:  Seizure Her symptomatology was thought to be secondary to seizure activity.  Patient was seen by neurology. Patient is noted to be on intravenous Keppra. MRI brain was done which does not show any acute findings.  But old strokes were noted.  Right upper quadrant abdominal mass A CT scan was done due to recent history of weight loss and a history of breast cancer.  This is a noncontrast study but raised concern for 2.5 cm mass in the right upper quadrant.  Not well-visualized due to lack of contrast.  Her LFTs are noted to be normal. Renal function is normal.  No mention of allergy to contrast material.  Unclear why no contrast was administered.   MRI abdomen is recommended which will be ordered.  History of vascular dementia According to critical care notes quality life has been okay per daughter.  Hypokalemia Will be supplemented.  Magnesium is 1.9.  Normocytic anemia/mild thrombocytopenia No evidence of overt bleeding.  Continue to monitor.  Oropharyngeal dysphagia Seen by speech therapy.  Noted to be at very high risk of aspiration.  Currently NPO.  Palliative care consulted.  Goals of  care Patient with history of dementia now with seizures and with significant aspiration risk: Swallow evaluation.  Palliative care consulted for goals of care.  DVT Prophylaxis: Subcutaneous heparin Code Status: Full code Family Communication: No family at bedside Disposition Plan: To be determined     Medications: Scheduled:  Chlorhexidine Gluconate Cloth  6 each Topical Daily   heparin  5,000 Units Subcutaneous Q8H   mouth rinse  15 mL Mouth Rinse 4 times per day   pantoprazole (PROTONIX) IV  40 mg Intravenous QHS   thiamine (VITAMIN B1) injection  100 mg Intravenous Q24H   Continuous:  dextrose 10 % and 0.45 % NaCl 100 mL/hr at 06/15/23 2225   folic acid 1 mg in sodium chloride 0.9 % 50 mL IVPB Stopped (06/15/23 1127)   levETIRAcetam 500 mg (06/16/23 0310)   potassium chloride 10 mEq (06/16/23 0859)   WJX:BJYNWGNF, labetalol, mouth rinse, polyethylene glycol  Antibiotics: Anti-infectives (From admission, onward)    Start     Dose/Rate Route Frequency Ordered Stop   06/14/23 0600  ceFEPIme (MAXIPIME) 2 g in sodium chloride 0.9 % 100 mL IVPB        2 g 200 mL/hr over 30 Minutes Intravenous  Once 06/14/23 0551 06/14/23 0650   06/14/23 0600  metroNIDAZOLE (FLAGYL) IVPB 500 mg        500 mg 100 mL/hr over 60 Minutes Intravenous  Once 06/14/23 0551 06/14/23 0729   06/14/23 0600  vancomycin (VANCOCIN) IVPB 1000 mg/200 mL premix        1,000  mg 200 mL/hr over 60 Minutes Intravenous  Once 06/14/23 0551 06/14/23 0913       Objective:  Vital Signs  Vitals:   06/16/23 0017 06/16/23 0418 06/16/23 0423 06/16/23 0800  BP: (!) 161/86 (!) 154/76  (!) 174/84  Pulse:      Resp: 13 (!) 7    Temp: (!) 97.4 F (36.3 C)     TempSrc: Oral     SpO2:      Weight:   41.4 kg   Height:        Intake/Output Summary (Last 24 hours) at 06/16/2023 0955 Last data filed at 06/16/2023 0429 Gross per 24 hour  Intake 2228.17 ml  Output 2100 ml  Net 128.17 ml   Filed Weights   06/14/23  0420 06/15/23 0500 06/16/23 0423  Weight: 41.6 kg 43.1 kg 41.4 kg    General appearance: Somnolent but arousable.  Does not really communicate much.  In no distress. Resp: Clear to auscultation bilaterally.  Normal effort Cardio: S1-S2 is normal regular.  No S3-S4.  No rubs murmurs or bruit GI: Abdomen is soft.  No obvious masses appreciated on examination. Significant physical deconditioning noted.   Lab Results:  Data Reviewed: I have personally reviewed following labs and reports of the imaging studies  CBC: Recent Labs  Lab 06/14/23 0404 06/14/23 0423 06/14/23 0529 06/15/23 0232 06/16/23 0417  WBC  --  4.1  --  7.7 4.9  NEUTROABS  --  3.0  --   --   --   HGB 12.2 9.8* 11.9* 10.7* 11.1*  HCT 36.0 28.4* 35.0* 31.9* 33.7*  MCV  --  95.9  --  96.7 98.0  PLT  --  97*  --  120* 104*    Basic Metabolic Panel: Recent Labs  Lab 06/14/23 0423 06/14/23 0529 06/14/23 1543 06/14/23 2330 06/15/23 0232 06/16/23 0417  NA 133* 135 138 141 143 139  K 2.3* 2.5* 3.5 4.1 4.4 3.0*  CL 99  --  103 107 108 104  CO2 23  --  26 25 30 26   GLUCOSE 105*  --  156* 79 92 109*  BUN 13  --  11 11 8 8   CREATININE 0.62  --  0.51 0.59 0.49 0.53  CALCIUM 7.8*  --  9.2 9.2 9.3 8.9  MG  --   --   --   --  1.8 1.9  PHOS  --   --   --   --  1.7* 2.6    GFR: Estimated Creatinine Clearance: 37.3 mL/min (by C-G formula based on SCr of 0.53 mg/dL).  Liver Function Tests: Recent Labs  Lab 06/14/23 0423 06/15/23 0232 06/16/23 0417  AST 33 31 28  ALT 22 25 23   ALKPHOS 66 64 58  BILITOT 0.4 0.6 0.7  PROT 3.8* 4.9* 5.3*  ALBUMIN 1.9* 2.7* 2.8*    Coagulation Profile: Recent Labs  Lab 06/14/23 0423  INR 1.3*   HbA1C: Recent Labs    06/14/23 0423  HGBA1C 4.7*    CBG: Recent Labs  Lab 06/15/23 1138 06/15/23 1535 06/15/23 2235 06/16/23 0025 06/16/23 0413  GLUCAP 98 91 92 110* 107*    Recent Results (from the past 240 hours)  Blood culture (routine x 2)     Status: None  (Preliminary result)   Collection Time: 06/14/23  5:40 AM   Specimen: BLOOD RIGHT HAND  Result Value Ref Range Status   Specimen Description BLOOD RIGHT HAND  Final   Special Requests  Final    BOTTLES DRAWN AEROBIC AND ANAEROBIC Blood Culture adequate volume   Culture   Final    NO GROWTH 2 DAYS Performed at [facility], [address], [place], [place] [address]    Report Status PENDING  Incomplete  Blood culture (routine x 2)     Status: None (Preliminary result)   Collection Time: 06/14/23  5:44 AM   Specimen: BLOOD RIGHT HAND  Result Value Ref Range Status   Specimen Description BLOOD RIGHT HAND  Final   Special Requests   Final    BOTTLES DRAWN AEROBIC AND ANAEROBIC Blood Culture results may not be optimal due to an inadequate volume of blood received in culture bottles   Culture   Final    NO GROWTH 2 DAYS Performed at [facility], [address] [place], [place] [address]    Report Status PENDING  Incomplete  Resp panel by RT-PCR (RSV, Flu A&B, Covid) Anterior Nasal Swab     Status: None   Collection Time: 06/14/23  6:45 AM   Specimen: Anterior Nasal Swab  Result Value Ref Range Status   SARS Coronavirus 2 by RT PCR NEGATIVE NEGATIVE Final   Influenza A by PCR NEGATIVE NEGATIVE Final   Influenza B by PCR NEGATIVE NEGATIVE Final    Comment: (NOTE) The Xpert Xpress SARS-CoV-2/FLU/RSV plus assay is intended as an aid in the diagnosis of influenza from Nasopharyngeal swab specimens and should not be used as a sole basis for treatment. Nasal washings and aspirates are unacceptable for Xpert Xpress SARS-CoV-2/FLU/RSV testing.  Fact Sheet for Patients: BloggerCourse.com  Fact Sheet for Healthcare Providers: SeriousBroker.it  This test is not yet approved or cleared by the [place] FDA and has been authorized for detection and/or diagnosis of SARS-CoV-2 by FDA under an Emergency Use Authorization  (EUA). This EUA will remain in effect (meaning this test can be used) for the duration of the COVID-19 declaration under Section 564(b)(1) of the Act, 21 U.S.C. section 360bbb-3(b)(1), unless the authorization is terminated or revoked.     Resp Syncytial Virus by PCR NEGATIVE NEGATIVE Final    Comment: (NOTE) Fact Sheet for Patients: BloggerCourse.com  Fact Sheet for Healthcare Providers: SeriousBroker.it  This test is not yet approved or cleared by the [place] FDA and has been authorized for detection and/or diagnosis of SARS-CoV-2 by FDA under an Emergency Use Authorization (EUA). This EUA will remain in effect (meaning this test can be used) for the duration of the COVID-19 declaration under Section 564(b)(1) of the Act, 21 U.S.C. section 360bbb-3(b)(1), unless the authorization is terminated or revoked.  Performed at [facility], [address] [place], [place] [address]   MRSA Next Gen by PCR, Nasal     Status: None   Collection Time: 06/14/23  2:15 PM   Specimen: Nasal Mucosa; Nasal Swab  Result Value Ref Range Status   MRSA by PCR Next Gen NOT DETECTED NOT DETECTED Final    Comment: (NOTE) The GeneXpert MRSA Assay (FDA approved for NASAL specimens only), is one component of a comprehensive MRSA colonization surveillance program. It is not intended to diagnose MRSA infection nor to guide or monitor treatment for MRSA infections. Test performance is not FDA approved in patients less than 64 years old. Performed at [facility], [address], [place], [place] [address]       Radiology Studies: MR BRAIN W WO CONTRAST Result Date: 06/15/2023 CLINICAL DATA:  New onset  seizure EXAM: MRI HEAD WITHOUT AND WITH CONTRAST TECHNIQUE: Multiplanar, multiecho pulse sequences of the brain and surrounding structures were obtained without and with intravenous contrast. CONTRAST:  4mL GADAVIST GADOBUTROL 1 MMOL/ML  IV SOLN COMPARISON:  None Available. FINDINGS: Brain: No acute infarct, mass effect or extra-axial collection. Small amount of chronic blood products over both hemispheres. Old left frontal, left parietal and right parietotemporal infarcts. 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 Chronic ischemic white matter changes. The midline structures are normal. Vascular: Normal flow voids. Skull and upper cervical spine: Normal calvarium and skull base. Visualized upper cervical spine and soft tissues are normal. Sinuses/Orbits:No paranasal sinus fluid levels or advanced mucosal thickening. No mastoid or middle ear effusion. Normal orbits. IMPRESSION: 1. No acute intracranial abnormality. 2. Old left frontal, left parietal and right parietotemporal infarcts. 3. 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 Electronically Signed   By: Deatra Robinson M.D.   On: 06/15/2023 19:33   CT ABDOMEN PELVIS WO CONTRAST Result Date: 06/15/2023 CLINICAL DATA:  Postop abdominal pain, rule out cancer. History breast cancer. EXAM: CT ABDOMEN AND PELVIS WITHOUT CONTRAST TECHNIQUE: Multidetector CT imaging of the abdomen and pelvis was performed following the standard protocol without IV contrast. RADIATION DOSE REDUCTION: This exam was performed according to the departmental dose-optimization program which includes automated exposure control, adjustment of the mA and/or kV according to patient size and/or use of iterative reconstruction technique. COMPARISON:  None are available FINDINGS: Exam is severely compromised by paucity of intra-abdominal fat and lack of oral or IV contrast. Lower chest: Small right pleural effusion. Hepatobiliary: Cholecystectomy. Unremarkable noncontrast appearance of the liver. Pancreas: Grossly unremarkable. Spleen: 1.3 cm hypoattenuating lesion in the spleen (series 3/image 16) is indeterminate but statistically likely to represent a benign cyst or hemangioma. Adrenals/Urinary Tract: Adrenal glands  are grossly unremarkable. Contrast from yesterday's CT head and neck within the renal collecting systems and bladder. Foley catheter in the bladder. No hydronephrosis. Stomach/Bowel: Large colonic stool burden. Vascular/Lymphatic: Advanced aortic atherosclerotic calcification. Round mildly hyperdense mass in the right upper quadrant measuring 2.5 cm (series 3/image 21 and series 7/image 61). This is not well evaluated without IV contrast. Reproductive: Not well visualized. Other: No free intraperitoneal air. Musculoskeletal: No acute fracture or destructive osseous lesion. Demineralization. IMPRESSION: 1. Exam is severely compromised by paucity of intra-abdominal fat and lack of oral or IV contrast. 2. Indeterminate 2.5 cm round mildly hyperdense mass in the right upper quadrant measuring 2.5 cm. This is not well evaluated without IV contrast. Recommend further evaluation with contrast-enhanced CT or MRI. 3. Large colonic stool burden. 4. Small right pleural effusion. 5. Aortic Atherosclerosis (ICD10-I70.0). Electronically Signed   By: Minerva Fester M.D.   On: 06/15/2023 19:28   Overnight EEG with video Result Date: 06/15/2023 Charlsie Quest, MD     06/15/2023  1:31 PM Patient Name: Olivia Ross MRN: [id number] Epilepsy Attending: Charlsie Quest Referring Physician/Provider: Erick Blinks, MD Duration: 06/14/2023 4098 to 06/15/2023 0944 Patient history: 78 y.o. 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 She had a GTC seizure that was witnessed by EMS enroute with forced L gaze deviation, outstertched arms and given versed. She was obtunded and being bagged on arrival with no clinical seizure activity noted. EEG to evaluate for seizure Level of alertness:  lethargic AEDs during EEG study: LEV, propofol Technical aspects: This EEG study was done  with scalp electrodes positioned according to the 10-20  International system of electrode placement. Electrical activity was reviewed with band pass filter of 1-70Hz , sensitivity of 7 uV/mm, display speed of 65mm/sec with a 60Hz  notched filter applied as appropriate. EEG data were recorded continuously and digitally stored.  Video monitoring was available and reviewed as appropriate. Description: EEG showed continuous generalized polymorphic 3 to 6 Hz theta-delta slowing, at times with triphasic morphology. Hyperventilation and photic stimulation were not performed.   ABNORMALITY - Continuous slow, generalized IMPRESSION: This study is suggestive of moderate diffuse encephalopathy. No seizures or definite epileptiform discharges were seen throughout the recording. Priyanka [name]       LOS: 2 days   Buffy Ehler [employer] Pager on [email or web address]  06/16/2023, 9:55 AM

## 2023-06-16 NOTE — Progress Notes (Signed)
 Speech Language Pathology Treatment: Dysphagia  Patient Details Name: Olivia Ross MRN: [id number] DOB: [birth year] Today's Date: 06/16/2023 Time: 4098-1191 SLP Time Calculation (min) (ACUTE ONLY): 18 min  Assessment / Plan / Recommendation Clinical Impression  Patient much improved from previous date. Chewing on gown and cords upon arrival to room but easily reoriented, remains confused but able to participate with SLP. Min verbal cueing for attention to pos however able to consume pureed solids and thin liquids with efficient oral manipulation of bolus, occasional oral holding with cleared bolus independently. Soft solids consumed with prolonged oral transit time and mild diffuse oral residuals which cleared with liquids wash. Recommend initiation of puree, thin liquids with SLP f/u for potential to advance. RN informed.    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  Family thought she may have UTI.  Patient initially alert then had a seizure en route to hospital.  EMS gave 5mg  IM Versed, but then she became apneic and she was intubated (Etomidate and Succinylcholine) in the ED.  She was diagnosed with hypoxic respiratory failure requiring intubation, seizure disorder, lactic acidosis and hypokalemia.  Most recent CT of the head was showing no acute findings with multiple chronic infarcts.      SLP Plan  Goals updated      Recommendations for follow up therapy are one component of a multi-disciplinary discharge planning process, led by the attending physician.  Recommendations may be updated based on patient status, additional functional criteria and insurance authorization.    Recommendations  Diet recommendations: Dysphagia 1 (puree);Thin liquid Liquids provided via: Cup;Straw Medication Administration: Crushed with puree Supervision: Full supervision/cueing for  compensatory strategies Compensations: Slow rate;Small sips/bites Postural Changes and/or Swallow Maneuvers: Seated upright 90 degrees                 Oral care BID   Frequent or constant Supervision/Assistance Dysphagia, oropharyngeal phase (R13.12)     Goals updated    Ferdinand Lango MA, CCC-SLP  Olivia Ross Olivia Ross  06/16/2023, 12:17 PM

## 2023-06-16 NOTE — Progress Notes (Signed)
 Unable to get oral or axillary temp on this patient. Would not register. Patient's respirations get as low as 5 and HR as low as 46. HR and resp will bounce back up if patient is stimulated.  Hopefully GOC can be decided sooner than later.  Thank you, Nursing

## 2023-06-16 NOTE — Progress Notes (Signed)
 Subjective: Awake and nonverbal.   Objective: Current vital signs: BP (!) 174/84 (BP Location: Left Arm)   Pulse 60   Temp (!) 97.4 F (36.3 C) (Oral)   Resp (!) 7   Ht 5\' 3"  (1.6 m)   Wt 41.4 kg   SpO2 100%   BMI 16.17 kg/m  Vital signs in last 24 hours: Temp:  [96.1 F (35.6 C)-99.1 F (37.3 C)] 97.4 F (36.3 C) (03/17 0017) Pulse Rate:  [59-98] 60 (03/16 2034) Resp:  [7-21] 7 (03/17 0418) BP: (134-177)/(53-119) 174/84 (03/17 0800) SpO2:  [95 %-100 %] 100 % (03/16 1900) Weight:  [41.4 kg] 41.4 kg (03/17 0423)  Intake/Output from previous day: 03/16 0701 - 03/17 0700 In: 2554.7 [I.V.:1956.6; IV Piggyback:598.1] Out: 2100 [Urine:2100] Intake/Output this shift: No intake/output data recorded. Nutritional status:  Diet Order             Diet NPO time specified  Diet effective now                   Physical Exam  HEENT:  [place]/AT Lungs: Respirations unlabored Extremities: Warm and well perfused.   Neurological Examination Mental Status: Awake and nonverbal except for incomprehensible hypophonic mumbling at times. Not following any commands. Did make eye contact with examiner when he called her name. Was sucking on a part of her bedsheet when examiner entered the room (bedsheet gently removed from her mouth).  Cranial Nerves: II: Fixates on examiner's face when her name is called.    III,IV, VI: No ptosis. Eyes are conjugate.  VII: Moves mouth without definite asymmetry VIII: Alerts to voice IX,X: Gag reflex deferred XI, XII: Not following commands for assessment Motor/Sensory: No spontaneous movement of any extremities.  Increased tone with severely decreased bulk x 4.  Not following commands. RUE rigid with contractures and without any movement to commands or stimuli.  LUE rigid but less so than on the right Upper extremities do not move to tactile or light pinch stimuli.  BLE rigid and semi-flexed at the knees and hips with minimal movement to tactile  stimuli (noxious deferred).  Cerebellar/Gait: Unable to assess   Lab Results: Results for orders placed or performed during the hospital encounter of 06/14/23 (from the past 48 hours)  CBG monitoring, ED     Status: Abnormal   Collection Time: 06/14/23 12:44 PM  Result Value Ref Range   Glucose-Capillary 68 (L) 70 - 99 mg/dL    Comment: Glucose reference range applies only to samples taken after fasting for at least 8 hours.  MRSA Next Gen by PCR, Nasal     Status: None   Collection Time: 06/14/23  2:15 PM   Specimen: Nasal Mucosa; Nasal Swab  Result Value Ref Range   MRSA by PCR Next Gen NOT DETECTED NOT DETECTED    Comment: (NOTE) The GeneXpert MRSA Assay (FDA approved for NASAL specimens only), is one component of a comprehensive MRSA colonization surveillance program. It is not intended to diagnose MRSA infection nor to guide or monitor treatment for MRSA infections. Test performance is not FDA approved in patients less than 81 years old. Performed at [facility], [address] [place], [place] [address]   Glucose, capillary     Status: Abnormal   Collection Time: 06/14/23  2:43 PM  Result Value Ref Range   Glucose-Capillary 69 (L) 70 - 99 mg/dL    Comment: Glucose reference range applies only to samples taken after fasting for at least 8  hours.  Glucose, capillary     Status: Abnormal   Collection Time: 06/14/23  3:05 PM  Result Value Ref Range   Glucose-Capillary 186 (H) 70 - 99 mg/dL    Comment: Glucose reference range applies only to samples taken after fasting for at least 8 hours.  Basic metabolic panel     Status: Abnormal   Collection Time: 06/14/23  3:43 PM  Result Value Ref Range   Sodium 138 135 - 145 mmol/L   Potassium 3.5 3.5 - 5.1 mmol/L   Chloride 103 98 - 111 mmol/L   CO2 26 22 - 32 mmol/L   Glucose, Bld 156 (H) 70 - 99 mg/dL    Comment: Glucose reference range applies only to samples taken after fasting for at least 8 hours.   BUN 11 8 - 23  mg/dL   Creatinine, Ser 1.61 0.44 - 1.00 mg/dL   Calcium 9.2 8.9 - 09.6 mg/dL   GFR, Estimated >04 >54 mL/min    Comment: (NOTE) Calculated using the CKD-EPI Creatinine Equation (2021)    Anion gap 9 5 - 15    Comment: Performed at [facility], [address] [place], [place] [address]  Cortisol     Status: None   Collection Time: 06/14/23  3:43 PM  Result Value Ref Range   Cortisol, Plasma 19.5 ug/dL    Comment: (NOTE) AM    6.7 - 22.6 ug/dL PM   <91.4       ug/dL Performed at [facility], [address], [place], [place] [address]   Glucose, capillary     Status: Abnormal   Collection Time: 06/14/23  3:47 PM  Result Value Ref Range   Glucose-Capillary 152 (H) 70 - 99 mg/dL    Comment: Glucose reference range applies only to samples taken after fasting for at least 8 hours.  Sodium, urine, random     Status: None   Collection Time: 06/14/23  3:50 PM  Result Value Ref Range   Sodium, Ur 79 mmol/L    Comment: Performed at [facility], [address] [place], [place] [address]  Creatinine, urine, random     Status: None   Collection Time: 06/14/23  3:50 PM  Result Value Ref Range   Creatinine, Urine <10 mg/dL    Comment: Performed at [facility], [address], [place], [place] [address]  Osmolality, urine     Status: Abnormal   Collection Time: 06/14/23  3:50 PM  Result Value Ref Range   Osmolality, Ur 180 (L) 300 - 900 mOsm/kg    Comment: REPEATED TO VERIFY Performed at [facility], [address] [place], [place] [address]   Glucose, capillary     Status: Abnormal   Collection Time: 06/14/23  8:21 PM  Result Value Ref Range   Glucose-Capillary 42 (LL) 70 - 99 mg/dL    Comment: Glucose reference range applies only to samples taken after fasting for at least 8 hours.  Glucose, capillary     Status: Abnormal   Collection Time: 06/14/23  8:47 PM  Result Value Ref Range   Glucose-Capillary 142 (H) 70 - 99 mg/dL    Comment: Glucose  reference range applies only to samples taken after fasting for at least 8 hours.  Basic metabolic panel     Status: None   Collection Time: 06/14/23 11:30 PM  Result Value Ref Range   Sodium 141 135 - 145 mmol/L   Potassium  4.1 3.5 - 5.1 mmol/L   Chloride 107 98 - 111 mmol/L   CO2 25 22 - 32 mmol/L   Glucose, Bld 79 70 - 99 mg/dL    Comment: Glucose reference range applies only to samples taken after fasting for at least 8 hours.   BUN 11 8 - 23 mg/dL   Creatinine, Ser 1.61 0.44 - 1.00 mg/dL   Calcium 9.2 8.9 - 09.6 mg/dL   GFR, Estimated >04 >54 mL/min    Comment: (NOTE) Calculated using the CKD-EPI Creatinine Equation (2021)    Anion gap 9 5 - 15    Comment: Performed at [facility], [address], [place], [place] [address]  Glucose, capillary     Status: Abnormal   Collection Time: 06/14/23 11:30 PM  Result Value Ref Range   Glucose-Capillary 66 (L) 70 - 99 mg/dL    Comment: Glucose reference range applies only to samples taken after fasting for at least 8 hours.  Glucose, capillary     Status: Abnormal   Collection Time: 06/15/23 12:14 AM  Result Value Ref Range   Glucose-Capillary 104 (H) 70 - 99 mg/dL    Comment: Glucose reference range applies only to samples taken after fasting for at least 8 hours.  Glucose, capillary     Status: None   Collection Time: 06/15/23  1:13 AM  Result Value Ref Range   Glucose-Capillary 93 70 - 99 mg/dL    Comment: Glucose reference range applies only to samples taken after fasting for at least 8 hours.  Glucose, capillary     Status: None   Collection Time: 06/15/23  2:08 AM  Result Value Ref Range   Glucose-Capillary 94 70 - 99 mg/dL    Comment: Glucose reference range applies only to samples taken after fasting for at least 8 hours.  Comprehensive metabolic panel     Status: Abnormal   Collection Time: 06/15/23  2:32 AM  Result Value Ref Range   Sodium 143 135 - 145 mmol/L   Potassium 4.4 3.5 - 5.1 mmol/L   Chloride 108 98  - 111 mmol/L   CO2 30 22 - 32 mmol/L   Glucose, Bld 92 70 - 99 mg/dL    Comment: Glucose reference range applies only to samples taken after fasting for at least 8 hours.   BUN 8 8 - 23 mg/dL   Creatinine, Ser 9.14 0.44 - 1.00 mg/dL   Calcium 9.3 8.9 - 78.2 mg/dL   Total Protein 4.9 (L) 6.5 - 8.1 g/dL   Albumin 2.7 (L) 3.5 - 5.0 g/dL   AST 31 15 - 41 U/L   ALT 25 0 - 44 U/L   Alkaline Phosphatase 64 38 - 126 U/L   Total Bilirubin 0.6 0.0 - 1.2 mg/dL   GFR, Estimated >95 >62 mL/min    Comment: (NOTE) Calculated using the CKD-EPI Creatinine Equation (2021)    Anion gap 5 5 - 15    Comment: Performed at [facility], [address] [place], [place] [address]  Magnesium     Status: None   Collection Time: 06/15/23  2:32 AM  Result Value Ref Range   Magnesium 1.8 1.7 - 2.4 mg/dL    Comment: Performed at [facility], [address], [place], [place] [address]  Phosphorus     Status: Abnormal   Collection Time: 06/15/23  2:32 AM  Result Value Ref Range   Phosphorus 1.7 (L) 2.5 - 4.6 mg/dL  Comment: Performed at [facility], [address], [place], [place] [address]  CBC     Status: Abnormal   Collection Time: 06/15/23  2:32 AM  Result Value Ref Range   WBC 7.7 4.0 - 10.5 K/uL   RBC 3.30 (L) 3.87 - 5.11 MIL/uL   Hemoglobin 10.7 (L) 12.0 - 15.0 g/dL   HCT 84.6 (L) 96.2 - 95.2 %   MCV 96.7 80.0 - 100.0 fL   MCH 32.4 26.0 - 34.0 pg   MCHC 33.5 30.0 - 36.0 g/dL   RDW 84.1 32.4 - 40.1 %   Platelets 120 (L) 150 - 400 K/uL    Comment: REPEATED TO VERIFY   nRBC 0.0 0.0 - 0.2 %    Comment: Performed at [facility], [address] [place], [place] [address]  Glucose, capillary     Status: Abnormal   Collection Time: 06/15/23  4:40 AM  Result Value Ref Range   Glucose-Capillary 114 (H) 70 - 99 mg/dL    Comment: Glucose reference range applies only to samples taken after fasting for at least 8 hours.  Glucose, capillary     Status: None   Collection Time:  06/15/23  8:05 AM  Result Value Ref Range   Glucose-Capillary 91 70 - 99 mg/dL    Comment: Glucose reference range applies only to samples taken after fasting for at least 8 hours.  Glucose, capillary     Status: None   Collection Time: 06/15/23 11:38 AM  Result Value Ref Range   Glucose-Capillary 98 70 - 99 mg/dL    Comment: Glucose reference range applies only to samples taken after fasting for at least 8 hours.  Glucose, capillary     Status: None   Collection Time: 06/15/23  3:35 PM  Result Value Ref Range   Glucose-Capillary 91 70 - 99 mg/dL    Comment: Glucose reference range applies only to samples taken after fasting for at least 8 hours.  Glucose, capillary     Status: None   Collection Time: 06/15/23 10:35 PM  Result Value Ref Range   Glucose-Capillary 92 70 - 99 mg/dL    Comment: Glucose reference range applies only to samples taken after fasting for at least 8 hours.  Glucose, capillary     Status: Abnormal   Collection Time: 06/16/23 12:25 AM  Result Value Ref Range   Glucose-Capillary 110 (H) 70 - 99 mg/dL    Comment: Glucose reference range applies only to samples taken after fasting for at least 8 hours.  Glucose, capillary     Status: Abnormal   Collection Time: 06/16/23  4:13 AM  Result Value Ref Range   Glucose-Capillary 107 (H) 70 - 99 mg/dL    Comment: Glucose reference range applies only to samples taken after fasting for at least 8 hours.  Comprehensive metabolic panel     Status: Abnormal   Collection Time: 06/16/23  4:17 AM  Result Value Ref Range   Sodium 139 135 - 145 mmol/L   Potassium 3.0 (L) 3.5 - 5.1 mmol/L   Chloride 104 98 - 111 mmol/L   CO2 26 22 - 32 mmol/L   Glucose, Bld 109 (H) 70 - 99 mg/dL    Comment: Glucose reference range applies only to samples taken after fasting for at least 8 hours.   BUN 8 8 - 23 mg/dL   Creatinine, Ser 3.66 0.44 - 1.00 mg/dL   Calcium 8.9 8.9 - 44.0 mg/dL   Total  Protein 5.3 (L) 6.5 - 8.1 g/dL   Albumin 2.8 (L)  3.5 - 5.0 g/dL   AST 28 15 - 41 U/L   ALT 23 0 - 44 U/L   Alkaline Phosphatase 58 38 - 126 U/L   Total Bilirubin 0.7 0.0 - 1.2 mg/dL   GFR, Estimated >30 >16 mL/min    Comment: (NOTE) Calculated using the CKD-EPI Creatinine Equation (2021)    Anion gap 9 5 - 15    Comment: Performed at [facility], [address], [place], [place] [address]  Magnesium     Status: None   Collection Time: 06/16/23  4:17 AM  Result Value Ref Range   Magnesium 1.9 1.7 - 2.4 mg/dL    Comment: Performed at [facility], [address], [place], [place] [address]  Phosphorus     Status: None   Collection Time: 06/16/23  4:17 AM  Result Value Ref Range   Phosphorus 2.6 2.5 - 4.6 mg/dL    Comment: Performed at [facility], [address], [place], [place] [address]  CBC     Status: Abnormal   Collection Time: 06/16/23  4:17 AM  Result Value Ref Range   WBC 4.9 4.0 - 10.5 K/uL   RBC 3.44 (L) 3.87 - 5.11 MIL/uL   Hemoglobin 11.1 (L) 12.0 - 15.0 g/dL   HCT 54.2 (L) 70.6 - 23.7 %   MCV 98.0 80.0 - 100.0 fL   MCH 32.3 26.0 - 34.0 pg   MCHC 32.9 30.0 - 36.0 g/dL   RDW 62.8 31.5 - 17.6 %   Platelets 104 (L) 150 - 400 K/uL    Comment: REPEATED TO VERIFY   nRBC 0.0 0.0 - 0.2 %    Comment: Performed at [facility], [address], [place], [place] [address]    Recent Results (from the past 240 hours)  Blood culture (routine x 2)     Status: None (Preliminary result)   Collection Time: 06/14/23  5:40 AM   Specimen: BLOOD RIGHT HAND  Result Value Ref Range Status   Specimen Description BLOOD RIGHT HAND  Final   Special Requests   Final    BOTTLES DRAWN AEROBIC AND ANAEROBIC Blood Culture adequate volume   Culture   Final    NO GROWTH 2 DAYS Performed at [facility], [address] [place], [place] [address]    Report Status PENDING  Incomplete  Blood culture (routine x 2)     Status: None (Preliminary result)   Collection Time: 06/14/23  5:44 AM   Specimen: BLOOD  RIGHT HAND  Result Value Ref Range Status   Specimen Description BLOOD RIGHT HAND  Final   Special Requests   Final    BOTTLES DRAWN AEROBIC AND ANAEROBIC Blood Culture results may not be optimal due to an inadequate volume of blood received in culture bottles   Culture   Final    NO GROWTH 2 DAYS Performed at [facility], [address], [place], [place] [address]    Report Status PENDING  Incomplete  Resp panel by RT-PCR (RSV, Flu A&B, Covid) Anterior Nasal Swab     Status: None   Collection Time: 06/14/23  6:45 AM   Specimen: Anterior Nasal Swab  Result Value Ref Range Status   SARS Coronavirus 2 by RT PCR NEGATIVE NEGATIVE Final   Influenza A by PCR NEGATIVE NEGATIVE Final   Influenza B by PCR NEGATIVE NEGATIVE  Final    Comment: (NOTE) The Xpert Xpress SARS-CoV-2/FLU/RSV plus assay is intended as an aid in the diagnosis of influenza from Nasopharyngeal swab specimens and should not be used as a sole basis for treatment. Nasal washings and aspirates are unacceptable for Xpert Xpress SARS-CoV-2/FLU/RSV testing.  Fact Sheet for Patients: BloggerCourse.com  Fact Sheet for Healthcare Providers: SeriousBroker.it  This test is not yet approved or cleared by the [place] FDA and has been authorized for detection and/or diagnosis of SARS-CoV-2 by FDA under an Emergency Use Authorization (EUA). This EUA will remain in effect (meaning this test can be used) for the duration of the COVID-19 declaration under Section 564(b)(1) of the Act, 21 U.S.C. section 360bbb-3(b)(1), unless the authorization is terminated or revoked.     Resp Syncytial Virus by PCR NEGATIVE NEGATIVE Final    Comment: (NOTE) Fact Sheet for Patients: BloggerCourse.com  Fact Sheet for Healthcare Providers: SeriousBroker.it  This test is not yet approved or cleared by the [place] FDA and has been  authorized for detection and/or diagnosis of SARS-CoV-2 by FDA under an Emergency Use Authorization (EUA). This EUA will remain in effect (meaning this test can be used) for the duration of the COVID-19 declaration under Section 564(b)(1) of the Act, 21 U.S.C. section 360bbb-3(b)(1), unless the authorization is terminated or revoked.  Performed at [facility], [address], [place], [place] [address]   MRSA Next Gen by PCR, Nasal     Status: None   Collection Time: 06/14/23  2:15 PM   Specimen: Nasal Mucosa; Nasal Swab  Result Value Ref Range Status   MRSA by PCR Next Gen NOT DETECTED NOT DETECTED Final    Comment: (NOTE) The GeneXpert MRSA Assay (FDA approved for NASAL specimens only), is one component of a comprehensive MRSA colonization surveillance program. It is not intended to diagnose MRSA infection nor to guide or monitor treatment for MRSA infections. Test performance is not FDA approved in patients less than 61 years old. Performed at [facility], [address] [place], [place] [address]     Lipid Panel No results for input(s): "CHOL", "TRIG", "HDL", "CHOLHDL", "VLDL", "LDLCALC" in the last 72 hours.  Studies/Results: MR BRAIN W WO CONTRAST Result Date: 06/15/2023 CLINICAL DATA:  New onset seizure EXAM: MRI HEAD WITHOUT AND WITH CONTRAST TECHNIQUE: Multiplanar, multiecho pulse sequences of the brain and surrounding structures were obtained without and with intravenous contrast. CONTRAST:  4mL GADAVIST GADOBUTROL 1 MMOL/ML IV SOLN COMPARISON:  None Available. FINDINGS: Brain: No acute infarct, mass effect or extra-axial collection. Small amount of chronic blood products over both hemispheres. Old left frontal, left parietal and right parietotemporal infarcts. 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 Chronic ischemic white matter changes. The midline structures are normal. Vascular: Normal flow voids. Skull and upper cervical spine: Normal calvarium and  skull base. Visualized upper cervical spine and soft tissues are normal. Sinuses/Orbits:No paranasal sinus fluid levels or advanced mucosal thickening. No mastoid or middle ear effusion. Normal orbits. IMPRESSION: 1. No acute intracranial abnormality. 2. Old left frontal, left parietal and right parietotemporal infarcts. 3. 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 Electronically Signed   By: Deatra Robinson M.D.   On: 06/15/2023 19:33   CT ABDOMEN PELVIS WO CONTRAST Result Date: 06/15/2023 CLINICAL DATA:  Postop abdominal pain, rule out cancer. History breast cancer. EXAM: CT ABDOMEN AND PELVIS WITHOUT CONTRAST TECHNIQUE: Multidetector CT imaging of the abdomen and pelvis was performed following the standard protocol without IV contrast.  RADIATION DOSE REDUCTION: This exam was performed according to the departmental dose-optimization program which includes automated exposure control, adjustment of the mA and/or kV according to patient size and/or use of iterative reconstruction technique. COMPARISON:  None are available FINDINGS: Exam is severely compromised by paucity of intra-abdominal fat and lack of oral or IV contrast. Lower chest: Small right pleural effusion. Hepatobiliary: Cholecystectomy. Unremarkable noncontrast appearance of the liver. Pancreas: Grossly unremarkable. Spleen: 1.3 cm hypoattenuating lesion in the spleen (series 3/image 16) is indeterminate but statistically likely to represent a benign cyst or hemangioma. Adrenals/Urinary Tract: Adrenal glands are grossly unremarkable. Contrast from yesterday's CT head and neck within the renal collecting systems and bladder. Foley catheter in the bladder. No hydronephrosis. Stomach/Bowel: Large colonic stool burden. Vascular/Lymphatic: Advanced aortic atherosclerotic calcification. Round mildly hyperdense mass in the right upper quadrant measuring 2.5 cm (series 3/image 21 and series 7/image 61). This is not well evaluated without IV contrast.  Reproductive: Not well visualized. Other: No free intraperitoneal air. Musculoskeletal: No acute fracture or destructive osseous lesion. Demineralization. IMPRESSION: 1. Exam is severely compromised by paucity of intra-abdominal fat and lack of oral or IV contrast. 2. Indeterminate 2.5 cm round mildly hyperdense mass in the right upper quadrant measuring 2.5 cm. This is not well evaluated without IV contrast. Recommend further evaluation with contrast-enhanced CT or MRI. 3. Large colonic stool burden. 4. Small right pleural effusion. 5. Aortic Atherosclerosis (ICD10-I70.0). Electronically Signed   By: Minerva Fester M.D.   On: 06/15/2023 19:28   Overnight EEG with video Result Date: 06/15/2023 Charlsie Quest, MD     06/15/2023  1:31 PM Patient Name: Olivia Ross MRN: [id number] Epilepsy Attending: Charlsie Quest Referring Physician/Provider: Erick Blinks, MD Duration: 06/14/2023 9562 to 06/15/2023 0944 Patient history: 78 y.o. 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 She had a GTC seizure that was witnessed by EMS enroute with forced L gaze deviation, outstertched arms and given versed. She was obtunded and being bagged on arrival with no clinical seizure activity noted. EEG to evaluate for seizure Level of alertness:  lethargic AEDs during EEG study: LEV, propofol Technical aspects: This EEG study was done with scalp electrodes positioned according to the 10-20 International system of electrode placement. Electrical activity was reviewed with band pass filter of 1-70Hz , sensitivity of 7 uV/mm, display speed of 71mm/sec with a 60Hz  notched filter applied as appropriate. EEG data were recorded continuously and digitally stored.  Video monitoring was available and reviewed as appropriate. Description: EEG showed continuous generalized polymorphic 3 to 6 Hz theta-delta slowing, at times with triphasic  morphology. Hyperventilation and photic stimulation were not performed.   ABNORMALITY - Continuous slow, generalized IMPRESSION: This study is suggestive of moderate diffuse encephalopathy. No seizures or definite epileptiform discharges were seen throughout the recording. Priyanka [name]    Medications: Scheduled:  Chlorhexidine Gluconate Cloth  6 each Topical Daily   heparin  5,000 Units Subcutaneous Q8H   mouth rinse  15 mL Mouth Rinse 4 times per day   pantoprazole (PROTONIX) IV  40 mg Intravenous QHS   thiamine (VITAMIN B1) injection  100 mg Intravenous Q24H   Continuous:  dextrose 10 % and 0.45 % NaCl 100 mL/hr at 06/16/23 1101   folic acid 1 mg in sodium chloride 0.9 % 50 mL IVPB Stopped (06/15/23 1127)   levETIRAcetam 500 mg (06/16/23 0310)  potassium chloride 10 mEq (06/16/23 1105)   Assessment: Aiyana Stegmann is a 78 y.o. female with hx of dementia and large right MCA stroke in 2022, L MCA stroke in Nov 2023, HTN, HLD who was brought in by EMS for left sided gaze and aphasia. She had a GTC seizure that was witnessed by EMS en route with forced L gaze deviation, outstretched arms and was given Versed. She was intubated on arrival to the ED for airway protection. LTM EEG showed continuous generalized slowing without epileptiform abnormalities. She has now been extubated and has had no further clinical or electrographic seizures. Suspect seizure 2/2 known cortical strokes. LTM EEG was discontinued on Sunday.  - Exam today reveals a debilitated, frail elderly patient who is awake but does not follow any commands. Chronic weakness from prior strokes is noted.  - LTM EEG report from Sunday: Continuous slow, generalized. This study is suggestive of moderate diffuse encephalopathy. No seizures or definite epileptiform discharges were seen throughout the recording. - MRI brain (3/16): Small amount of chronic blood products over both hemispheres. Old left frontal, left parietal and large right  parietotemporal infarcts. 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 Chronic ischemic white matter changes.  - Impression: Gradually improving mentation in a postictal patient with dementia, following presentation with GTC seizure.      Recommendations: - Continue Keppra 500 mg bid inpatient and at discharge - Seizure precautions - Ativan 2mg  IV and page neuro if seizure - Discussed plan of care with the patient's daughter, who expressed understanding and agreement.  - Neurohospitalist service will sign off. Please call if there are additional questions.     LOS: 2 days   @Electronically  signed: Dr. Caryl Pina 06/16/2023  11:10 AM

## 2023-06-17 ENCOUNTER — Inpatient Hospital Stay (HOSPITAL_COMMUNITY)

## 2023-06-17 DIAGNOSIS — R1312 Dysphagia, oropharyngeal phase: Secondary | ICD-10-CM | POA: Diagnosis not present

## 2023-06-17 DIAGNOSIS — D490 Neoplasm of unspecified behavior of digestive system: Secondary | ICD-10-CM

## 2023-06-17 DIAGNOSIS — F0390 Unspecified dementia without behavioral disturbance: Secondary | ICD-10-CM | POA: Diagnosis not present

## 2023-06-17 DIAGNOSIS — G40409 Other generalized epilepsy and epileptic syndromes, not intractable, without status epilepticus: Secondary | ICD-10-CM | POA: Diagnosis not present

## 2023-06-17 DIAGNOSIS — E876 Hypokalemia: Secondary | ICD-10-CM | POA: Diagnosis not present

## 2023-06-17 LAB — CBC
HCT: 32.8 % — ABNORMAL LOW (ref 36.0–46.0)
Hemoglobin: 10.8 g/dL — ABNORMAL LOW (ref 12.0–15.0)
MCH: 32.3 pg (ref 26.0–34.0)
MCHC: 32.9 g/dL (ref 30.0–36.0)
MCV: 98.2 fL (ref 80.0–100.0)
Platelets: 113 10*3/uL — ABNORMAL LOW (ref 150–400)
RBC: 3.34 MIL/uL — ABNORMAL LOW (ref 3.87–5.11)
RDW: 14.6 % (ref 11.5–15.5)
WBC: 5.7 10*3/uL (ref 4.0–10.5)
nRBC: 0 % (ref 0.0–0.2)

## 2023-06-17 LAB — COMPREHENSIVE METABOLIC PANEL
ALT: 26 U/L (ref 0–44)
AST: 29 U/L (ref 15–41)
Albumin: 2.6 g/dL — ABNORMAL LOW (ref 3.5–5.0)
Alkaline Phosphatase: 70 U/L (ref 38–126)
Anion gap: 7 (ref 5–15)
BUN: 7 mg/dL — ABNORMAL LOW (ref 8–23)
CO2: 27 mmol/L (ref 22–32)
Calcium: 8.8 mg/dL — ABNORMAL LOW (ref 8.9–10.3)
Chloride: 104 mmol/L (ref 98–111)
Creatinine, Ser: 0.56 mg/dL (ref 0.44–1.00)
GFR, Estimated: >60 mL/min (ref >60)
Glucose, Bld: 69 mg/dL — ABNORMAL LOW (ref 70–99)
Potassium: 3.1 mmol/L — ABNORMAL LOW (ref 3.5–5.1)
Sodium: 138 mmol/L (ref 135–145)
Total Bilirubin: 0.5 mg/dL (ref 0.0–1.2)
Total Protein: 5 g/dL — ABNORMAL LOW (ref 6.5–8.1)

## 2023-06-17 LAB — MAGNESIUM: Magnesium: 1.6 mg/dL — ABNORMAL LOW (ref 1.7–2.4)

## 2023-06-17 LAB — GLUCOSE, CAPILLARY
Glucose-Capillary: 104 mg/dL — ABNORMAL HIGH (ref 70–99)
Glucose-Capillary: 117 mg/dL — ABNORMAL HIGH (ref 70–99)
Glucose-Capillary: 119 mg/dL — ABNORMAL HIGH (ref 70–99)
Glucose-Capillary: 121 mg/dL — ABNORMAL HIGH (ref 70–99)
Glucose-Capillary: 56 mg/dL — ABNORMAL LOW (ref 70–99)
Glucose-Capillary: 63 mg/dL — ABNORMAL LOW (ref 70–99)
Glucose-Capillary: 67 mg/dL — ABNORMAL LOW (ref 70–99)
Glucose-Capillary: 75 mg/dL (ref 70–99)
Glucose-Capillary: 97 mg/dL (ref 70–99)

## 2023-06-17 LAB — PHOSPHORUS: Phosphorus: 1.2 mg/dL — ABNORMAL LOW (ref 2.5–4.6)

## 2023-06-17 MED ORDER — POLYETHYLENE GLYCOL 3350 17 G PO PACK
17.0000 g | PACK | Freq: Two times a day (BID) | ORAL | Status: DC
  Administered 2023-06-17 – 2023-06-29 (×12): 17 g via ORAL
  Filled 2023-06-17 (×20): qty 1

## 2023-06-17 MED ORDER — GADOBUTROL 1 MMOL/ML IV SOLN
4.0000 mL | Freq: Once | INTRAVENOUS | Status: AC | PRN
  Administered 2023-06-17: 4 mL via INTRAVENOUS

## 2023-06-17 MED ORDER — IRBESARTAN 150 MG PO TABS
75.0000 mg | ORAL_TABLET | Freq: Every day | ORAL | Status: DC
  Administered 2023-06-17 – 2023-07-03 (×14): 75 mg via ORAL
  Filled 2023-06-17 (×17): qty 1

## 2023-06-17 MED ORDER — FOLIC ACID 1 MG PO TABS
1.0000 mg | ORAL_TABLET | Freq: Every day | ORAL | Status: DC
  Administered 2023-06-18 – 2023-07-03 (×15): 1 mg via ORAL
  Filled 2023-06-17 (×16): qty 1

## 2023-06-17 MED ORDER — SENNOSIDES-DOCUSATE SODIUM 8.6-50 MG PO TABS
2.0000 | ORAL_TABLET | Freq: Two times a day (BID) | ORAL | Status: DC
  Administered 2023-06-17 – 2023-06-29 (×18): 2 via ORAL
  Filled 2023-06-17 (×24): qty 2

## 2023-06-17 MED ORDER — MAGNESIUM SULFATE 4 GM/100ML IV SOLN
4.0000 g | Freq: Once | INTRAVENOUS | Status: AC
  Administered 2023-06-17: 4 g via INTRAVENOUS
  Filled 2023-06-17: qty 100

## 2023-06-17 MED ORDER — AMLODIPINE BESYLATE 5 MG PO TABS
5.0000 mg | ORAL_TABLET | Freq: Every day | ORAL | Status: DC
Start: 2023-06-17 — End: 2023-07-04
  Administered 2023-06-17 – 2023-07-03 (×14): 5 mg via ORAL
  Filled 2023-06-17 (×17): qty 1

## 2023-06-17 MED ORDER — ENSURE ENLIVE PO LIQD
237.0000 mL | Freq: Two times a day (BID) | ORAL | Status: DC
  Administered 2023-06-17 – 2023-06-26 (×14): 237 mL via ORAL

## 2023-06-17 MED ORDER — DEXTROSE-SODIUM CHLORIDE 5-0.45 % IV SOLN: INTRAVENOUS | Status: DC

## 2023-06-17 MED ORDER — PANTOPRAZOLE SODIUM 40 MG PO TBEC
40.0000 mg | DELAYED_RELEASE_TABLET | Freq: Every day | ORAL | Status: DC
  Administered 2023-06-17 – 2023-06-30 (×13): 40 mg via ORAL
  Filled 2023-06-17 (×13): qty 1

## 2023-06-17 MED ORDER — POTASSIUM PHOSPHATES 15 MMOLE/5ML IV SOLN
30.0000 mmol | Freq: Once | INTRAVENOUS | Status: AC
  Administered 2023-06-17: 30 mmol via INTRAVENOUS
  Filled 2023-06-17: qty 10

## 2023-06-17 NOTE — Progress Notes (Addendum)
 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 Family thought she may have UTI. Patient initially alert then had a seizure en route to hospital. EMS gave 5mg  IM Versed, but then she became apneic and she was intubated (Etomidate and Succinylcholine) in the ED. Patient was hospitalized to the intensive care unit.  Consultants: Neurology.  Critical care medicine.  Procedures: None    Subjective/Interval History: Patient noted to be awake.  Responds to my questions but does not answer appropriately at times.  Distracted.  Denies any pain.   Assessment/Plan:  Seizure Her symptomatology was thought to be secondary to seizure activity.  Patient was seen by neurology. Patient was previously on Keppra to 50 mg twice a day.  Increase to 500 mg twice a day. MRI brain was done which does not show any acute findings.  But old strokes were noted. Neurology has discussed these findings with patient's daughter.  They have signed off.  Pancreatic tumor A CT scan was done due to recent history of weight loss and a history of breast cancer.  This is a noncontrast study but raised concern for 2.5 cm mass in the right upper quadrant.  Not well-visualized due to lack of contrast.  Her LFTs are noted to be normal. Underwent MRI which showed a 2.2 x 2.1 cm lesion in the pancreatic head raising concern for neoplasm.  Will check CA 19-9. Considering her dementia she may not be a candidate for further testing.  Will discuss these results with the patient's daughter.  Palliative care is following.  Hypoglycemia Likely due to poor oral intake.  Continue D5 infusion.  Monitor CBGs.  Started on diet so hopefully glucose levels will improve.  Not on any glucose  lowering agents currently.  Constipation Large burden of stool noted on MRI.  Will initiate oral laxatives and stool softeners.  Check TSH.  History of vascular dementia According to critical care notes quality life has been okay per daughter.  PT and OT evaluation.  Hypokalemia/hypomagnesemia/hypophosphatemia Will be supplemented.  Recheck labs in the morning  Normocytic anemia/mild thrombocytopenia No evidence of overt bleeding.  Continue to monitor.  Oropharyngeal dysphagia Now on dysphagia 1 diet.  Will change some of her medications to oral route.  Goals of care Patient with history of dementia now with seizures and with significant aspiration risk: Swallow evaluation.  Palliative care consulted for goals of care. Now with possible pancreatic tumor.  Spoke to her daughter.  Told her of the findings of the MRI abdomen and concern for pancreatic cancer.  Could be possible explanation of her recent weight loss.  Patient's daughter wants to process this information before making further decisions regarding CODE STATUS.  She understands the patient is not a candidate for any treatments and may not truly benefit from procedures such as biopsy.  She will make a final decision over the course of the next day or so.  In the meantime a CA 19-9 level will be sent.  Daughter was strongly urged to consider changing CODE STATUS and she will get back to [place] on that.  DVT Prophylaxis: Subcutaneous heparin Code Status: Full code Family Communication: No family at bedside.  Will update daughter later today. Disposition Plan: To be determined.  Await  PT and OT eval.     Medications: Scheduled:  Chlorhexidine Gluconate Cloth  6 each Topical Daily   feeding supplement  237 mL Oral BID BM   heparin  5,000 Units Subcutaneous Q8H   mouth rinse  15 mL Mouth Rinse 4 times per day   pantoprazole (PROTONIX) IV  40 mg Intravenous QHS   thiamine (VITAMIN B1) injection  100 mg Intravenous Q24H    Continuous:  dextrose 10 % and 0.45 % NaCl Stopped (06/17/23 0746)   dextrose 5 % and 0.45 % NaCl 50 mL/hr at 06/17/23 0748   folic acid 1 mg in sodium chloride 0.9 % 50 mL IVPB 1 mg (06/17/23 0925)   levETIRAcetam Stopped (06/17/23 0456)   potassium PHOSPHATE IVPB (in mmol) 30 mmol (06/17/23 0751)   QMV:HQIONGEX, labetalol, mouth rinse, polyethylene glycol  Antibiotics: Anti-infectives (From admission, onward)    Start     Dose/Rate Route Frequency Ordered Stop   06/14/23 0600  ceFEPIme (MAXIPIME) 2 g in sodium chloride 0.9 % 100 mL IVPB        2 g 200 mL/hr over 30 Minutes Intravenous  Once 06/14/23 0551 06/14/23 0650   06/14/23 0600  metroNIDAZOLE (FLAGYL) IVPB 500 mg        500 mg 100 mL/hr over 60 Minutes Intravenous  Once 06/14/23 0551 06/14/23 0729   06/14/23 0600  vancomycin (VANCOCIN) IVPB 1000 mg/200 mL premix        1,000 mg 200 mL/hr over 60 Minutes Intravenous  Once 06/14/23 0551 06/14/23 0913       Objective:  Vital Signs  Vitals:   06/16/23 1940 06/16/23 2325 06/17/23 0418 06/17/23 0825  BP: (!) 185/64 (!) 161/81 (!) 160/82 (!) 167/79  Pulse: 71 74 65   Resp: 18 12 11 14   Temp: (!) 97.4 F (36.3 C) (!) 97.5 F (36.4 C) 97.6 F (36.4 C)   TempSrc: Oral Oral Oral Other (Comment)  SpO2: 100% 100% 98%   Weight:   39.6 kg   Height:        Intake/Output Summary (Last 24 hours) at 06/17/2023 1025 Last data filed at 06/17/2023 0507 Gross per 24 hour  Intake 3101.78 ml  Output 2000 ml  Net 1101.78 ml   Filed Weights   06/15/23 0500 06/16/23 0423 06/17/23 0418  Weight: 43.1 kg 41.4 kg 39.6 kg   General appearance: Awake alert.  In no distress.  Distracted Resp: Clear to auscultation bilaterally.  Normal effort Cardio: S1-S2 is normal regular.  No S3-S4.  No rubs murmurs or bruit GI: Abdomen is soft.  Nontender nondistended.  Bowel sounds are present normal.  No masses organomegaly    Lab Results:  Data Reviewed: I have personally reviewed  following labs and reports of the imaging studies  CBC: Recent Labs  Lab 06/14/23 0423 06/14/23 0529 06/15/23 0232 06/16/23 0417 06/17/23 0437  WBC 4.1  --  7.7 4.9 5.7  NEUTROABS 3.0  --   --   --   --   HGB 9.8* 11.9* 10.7* 11.1* 10.8*  HCT 28.4* 35.0* 31.9* 33.7* 32.8*  MCV 95.9  --  96.7 98.0 98.2  PLT 97*  --  120* 104* 113*    Basic Metabolic Panel: Recent Labs  Lab 06/14/23 1543 06/14/23 2330 06/15/23 0232 06/16/23 0417 06/17/23 0437  NA 138 141 143 139 138  K 3.5 4.1 4.4 3.0* 3.1*  CL 103 107 108 104 104  CO2 26 25 30 26 27   GLUCOSE 156* 79  92 109* 69*  BUN 11 11 8 8  7*  CREATININE 0.51 0.59 0.49 0.53 0.56  CALCIUM 9.2 9.2 9.3 8.9 8.8*  MG  --   --  1.8 1.9 1.6*  PHOS  --   --  1.7* 2.6 1.2*    GFR: Estimated Creatinine Clearance: 35.6 mL/min (by C-G formula based on SCr of 0.56 mg/dL).  Liver Function Tests: Recent Labs  Lab 06/14/23 0423 06/15/23 0232 06/16/23 0417 06/17/23 0437  AST 33 31 28 29   ALT 22 25 23 26   ALKPHOS 66 64 58 70  BILITOT 0.4 0.6 0.7 0.5  PROT 3.8* 4.9* 5.3* 5.0*  ALBUMIN 1.9* 2.7* 2.8* 2.6*    Coagulation Profile: Recent Labs  Lab 06/14/23 0423  INR 1.3*   HbA1C: No results for input(s): "HGBA1C" in the last 72 hours.   CBG: Recent Labs  Lab 06/17/23 0002 06/17/23 0413 06/17/23 0417 06/17/23 0438 06/17/23 0913  GLUCAP 117* 56* 63* 67* 97    Recent Results (from the past 240 hours)  Blood culture (routine x 2)     Status: None (Preliminary result)   Collection Time: 06/14/23  5:40 AM   Specimen: BLOOD RIGHT HAND  Result Value Ref Range Status   Specimen Description BLOOD RIGHT HAND  Final   Special Requests   Final    BOTTLES DRAWN AEROBIC AND ANAEROBIC Blood Culture adequate volume   Culture   Final    NO GROWTH 3 DAYS Performed at [facility], [address] [place], [place] [address]    Report Status PENDING  Incomplete  Blood culture (routine x 2)     Status: None (Preliminary result)    Collection Time: 06/14/23  5:44 AM   Specimen: BLOOD RIGHT HAND  Result Value Ref Range Status   Specimen Description BLOOD RIGHT HAND  Final   Special Requests   Final    BOTTLES DRAWN AEROBIC AND ANAEROBIC Blood Culture results may not be optimal due to an inadequate volume of blood received in culture bottles   Culture   Final    NO GROWTH 3 DAYS Performed at [facility], [address], [place], [place] [address]    Report Status PENDING  Incomplete  Resp panel by RT-PCR (RSV, Flu A&B, Covid) Anterior Nasal Swab     Status: None   Collection Time: 06/14/23  6:45 AM   Specimen: Anterior Nasal Swab  Result Value Ref Range Status   SARS Coronavirus 2 by RT PCR NEGATIVE NEGATIVE Final   Influenza A by PCR NEGATIVE NEGATIVE Final   Influenza B by PCR NEGATIVE NEGATIVE Final    Comment: (NOTE) The Xpert Xpress SARS-CoV-2/FLU/RSV plus assay is intended as an aid in the diagnosis of influenza from Nasopharyngeal swab specimens and should not be used as a sole basis for treatment. Nasal washings and aspirates are unacceptable for Xpert Xpress SARS-CoV-2/FLU/RSV testing.  Fact Sheet for Patients: BloggerCourse.com  Fact Sheet for Healthcare Providers: SeriousBroker.it  This test is not yet approved or cleared by the [place] FDA and has been authorized for detection and/or diagnosis of SARS-CoV-2 by FDA under an Emergency Use Authorization (EUA). This EUA will remain in effect (meaning this test can be used) for the duration of the COVID-19 declaration under Section 564(b)(1) of the Act, 21 U.S.C. section 360bbb-3(b)(1), unless the authorization is terminated or revoked.     Resp Syncytial Virus by PCR NEGATIVE NEGATIVE Final    Comment: (NOTE) Fact Sheet for Patients:  BloggerCourse.com  Fact Sheet for Healthcare Providers: SeriousBroker.it  This test is not yet  approved or cleared by the [place] FDA and has been authorized for detection and/or diagnosis of SARS-CoV-2 by FDA under an Emergency Use Authorization (EUA). This EUA will remain in effect (meaning this test can be used) for the duration of the COVID-19 declaration under Section 564(b)(1) of the Act, 21 U.S.C. section 360bbb-3(b)(1), unless the authorization is terminated or revoked.  Performed at [facility], [address], [place], [place] [address]   MRSA Next Gen by PCR, Nasal     Status: None   Collection Time: 06/14/23  2:15 PM   Specimen: Nasal Mucosa; Nasal Swab  Result Value Ref Range Status   MRSA by PCR Next Gen NOT DETECTED NOT DETECTED Final    Comment: (NOTE) The GeneXpert MRSA Assay (FDA approved for NASAL specimens only), is one component of a comprehensive MRSA colonization surveillance program. It is not intended to diagnose MRSA infection nor to guide or monitor treatment for MRSA infections. Test performance is not FDA approved in patients less than 26 years old. Performed at [facility], [address] [place], [place] [address]       Radiology Studies: MR ABDOMEN W WO CONTRAST Result Date: 06/17/2023 CLINICAL DATA:  Abdominal mass identified by prior CT EXAM: MRI ABDOMEN WITHOUT AND WITH CONTRAST TECHNIQUE: Multiplanar multisequence MR imaging of the abdomen was performed both before and after the administration of intravenous contrast. CONTRAST:  4mL GADAVIST GADOBUTROL 1 MMOL/ML IV SOLN COMPARISON:  CT abdomen pelvis, 06/15/2023 FINDINGS: Examination is significantly limited by breath motion artifact throughout, particularly limiting multiphasic contrast enhanced sequences. Lower chest: No acute abnormality. Hepatobiliary: No focal liver abnormality is seen. Status post cholecystectomy. No biliary dilatation. Pancreas: Rounded, circumscribed appearing lesion in the central pancreatic head measuring 2.2 x 2.1 cm (series 10, image 22). This  appears to demonstrate some degree of contrast enhancement however evaluation is substantially limited by pervasive breath motion artifact degrading multiphasic enhanced sequences. Incidental note of pancreas divisum. No pancreatic ductal dilatation or surrounding inflammatory changes. Spleen: Normal in size without significant abnormality. Adrenals/Urinary Tract: Adrenal glands are unremarkable. Kidneys are normal, without renal calculi, solid lesion, or hydronephrosis. Stomach/Bowel: Stomach is within normal limits. No evidence of bowel wall thickening, distention, or inflammatory changes. Large burden of stool in the included colon. Vascular/Lymphatic: Severe aortic atherosclerosis. No enlarged abdominal lymph nodes. Other: No abdominal wall hernia or abnormality. No ascites. Musculoskeletal: Diffusely heterogeneous, abnormal appearance of the vertebral and pelvic bone marrow (series 4, image 16). IMPRESSION: 1. Examination is significantly limited by breath motion artifact throughout, particularly limiting multiphasic contrast enhanced sequences. 2. Rounded, circumscribed appearing lesion in the central pancreatic head measuring 2.2 x 2.1 cm. This appears to demonstrate some degree of contrast enhancement however evaluation is substantially limited by pervasive breath motion artifact degrading multiphasic enhanced sequences. This is concerning for pancreatic neoplasm, although not typical in appearance for adenocarcinoma, possibly a neuroendocrine tumor. Consider EUS/FNA for further assessment. 3. Diffusely heterogeneous, abnormal appearance of the bone marrow, of uncertain significance although worrisome for diffuse osseous metastatic disease. 4. Pancreas divisum. 5. Status post cholecystectomy. 6. Large burden of stool. Aortic Atherosclerosis (ICD10-I70.0). Electronically Signed   By: Jearld Lesch M.D.   On: 06/17/2023 08:26   MR BRAIN W WO CONTRAST Result Date: 06/15/2023 CLINICAL DATA:  New onset seizure  EXAM: MRI HEAD WITHOUT AND WITH CONTRAST TECHNIQUE: Multiplanar, multiecho pulse sequences of the brain  and surrounding structures were obtained without and with intravenous contrast. CONTRAST:  4mL GADAVIST GADOBUTROL 1 MMOL/ML IV SOLN COMPARISON:  None Available. FINDINGS: Brain: No acute infarct, mass effect or extra-axial collection. Small amount of chronic blood products over both hemispheres. Old left frontal, left parietal and right parietotemporal infarcts. 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 Chronic ischemic white matter changes. The midline structures are normal. Vascular: Normal flow voids. Skull and upper cervical spine: Normal calvarium and skull base. Visualized upper cervical spine and soft tissues are normal. Sinuses/Orbits:No paranasal sinus fluid levels or advanced mucosal thickening. No mastoid or middle ear effusion. Normal orbits. IMPRESSION: 1. No acute intracranial abnormality. 2. Old left frontal, left parietal and right parietotemporal infarcts. 3. 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 Electronically Signed   By: Deatra Robinson M.D.   On: 06/15/2023 19:33   CT ABDOMEN PELVIS WO CONTRAST Result Date: 06/15/2023 CLINICAL DATA:  Postop abdominal pain, rule out cancer. History breast cancer. EXAM: CT ABDOMEN AND PELVIS WITHOUT CONTRAST TECHNIQUE: Multidetector CT imaging of the abdomen and pelvis was performed following the standard protocol without IV contrast. RADIATION DOSE REDUCTION: This exam was performed according to the departmental dose-optimization program which includes automated exposure control, adjustment of the mA and/or kV according to patient size and/or use of iterative reconstruction technique. COMPARISON:  None are available FINDINGS: Exam is severely compromised by paucity of intra-abdominal fat and lack of oral or IV contrast. Lower chest: Small right pleural effusion. Hepatobiliary: Cholecystectomy. Unremarkable noncontrast appearance of the liver.  Pancreas: Grossly unremarkable. Spleen: 1.3 cm hypoattenuating lesion in the spleen (series 3/image 16) is indeterminate but statistically likely to represent a benign cyst or hemangioma. Adrenals/Urinary Tract: Adrenal glands are grossly unremarkable. Contrast from yesterday's CT head and neck within the renal collecting systems and bladder. Foley catheter in the bladder. No hydronephrosis. Stomach/Bowel: Large colonic stool burden. Vascular/Lymphatic: Advanced aortic atherosclerotic calcification. Round mildly hyperdense mass in the right upper quadrant measuring 2.5 cm (series 3/image 21 and series 7/image 61). This is not well evaluated without IV contrast. Reproductive: Not well visualized. Other: No free intraperitoneal air. Musculoskeletal: No acute fracture or destructive osseous lesion. Demineralization. IMPRESSION: 1. Exam is severely compromised by paucity of intra-abdominal fat and lack of oral or IV contrast. 2. Indeterminate 2.5 cm round mildly hyperdense mass in the right upper quadrant measuring 2.5 cm. This is not well evaluated without IV contrast. Recommend further evaluation with contrast-enhanced CT or MRI. 3. Large colonic stool burden. 4. Small right pleural effusion. 5. Aortic Atherosclerosis (ICD10-I70.0). Electronically Signed   By: Minerva Fester M.D.   On: 06/15/2023 19:28       LOS: 3 days   Olivia Ross  [facility] Pager on [email or web address]  06/17/2023, 10:25 AM

## 2023-06-17 NOTE — Evaluation (Signed)
 Occupational Therapy Evaluation Patient Details Name: Olivia Ross MRN: [id number] DOB: 06/23/1945 Today's Date: 06/17/2023   History of Present Illness   78 y.o. female presents to ED 06/14/23 for Code Stroke, left gaze and aphasic, questionable seizure. EEG showing potential moderate diffuse encephalopathy with no apparent seizure activity.  Intubated <24 hours  PMH: dementia, HTN, right breast cancer, R MCA CVA, L MCA CVA     Clinical Impressions This 78 yo female admitted with above presents to acute OT with PLOF per dtr of pt being able to feed herself once setup with tray table in her recliner at home, could roll herself in bed and sit up by herself in bed. Currently she is min A with constant S for feeding due to swallowing and total A +2 to come up to sit EOB with total-CGA to sit EOB. She will continue to benefit  from acute OT with follow up HHOT.     If plan is discharge home, recommend the following:   Two people to help with walking and/or transfers;A lot of help with bathing/dressing/bathroom;Assistance with cooking/housework;Assistance with feeding;Assist for transportation;Help with stairs or ramp for entrance;Direct supervision/assist for financial management;Direct supervision/assist for medications management     Functional Status Assessment   Patient has had a recent decline in their functional status and demonstrates the ability to make significant improvements in function in a reasonable and predictable amount of time.     Equipment Recommendations   Hospital bed      Precautions/Restrictions   Precautions Precautions: Fall Precaution/Restrictions Comments: multiple bony prominences--currently covered wtih Mepilix Restrictions Weight Bearing Restrictions Per Provider Order: No     Mobility Bed Mobility Overal bed mobility: Needs Assistance Bed Mobility: Supine to Sit     Supine to sit: Total assist, +2 for physical assistance           Transfers Overall transfer level: Needs assistance   Transfers: Bed to chair/wheelchair/BSC            Lateral/Scoot Transfers: Total assist, +2 physical assistance General transfer comment: use of pad from bed to drop arm recliner      Balance Overall balance assessment: Needs assistance Sitting-balance support: Single extremity supported, Feet supported   Sitting balance - Comments: varied from fair to zero, at times she would push with LUE and resisted coming back up to midline from right forearm propping                                   ADL either performed or assessed with clinical judgement   ADL Overall ADL's : Needs assistance/impaired Eating/Feeding: Minimal assistance;Sitting Eating/Feeding Details (indicate cue type and reason): supported in recliner or in bed with HOB raised; could drink from cup once handed to her Grooming: Moderate assistance Grooming Details (indicate cue type and reason): supported sitting Upper Body Bathing: Total assistance;Bed level   Lower Body Bathing: Total assistance;Bed level   Upper Body Dressing : Total assistance;Bed level   Lower Body Dressing: Total assistance;Bed level     Toilet Transfer Details (indicate cue type and reason): uses Depends in bed                 Vision   Additional Comments: reports she wears glasses, tends to keep head and eyes turned to the right, but can come to the left past midline with head and eyes when prompted  Pertinent Vitals/Pain Pain Assessment Pain Assessment: PAINAD Breathing: normal Negative Vocalization: occasional moan/groan, low speech, negative/disapproving quality (when attempting some PROM of RUE) Facial Expression: sad, frightened, frown (when attempting some PROM of RUE) Body Language: relaxed Consolability: no need to console PAINAD Score: 2 Pain Intervention(s): Limited activity within patient's tolerance     Extremity/Trunk  Assessment Upper Extremity Assessment Upper Extremity Assessment: LUE deficits/detail RUE Deficits / Details: contractures (flexed fingers, flexed elbow, adducted arm); can move it spontaneously but does tend to jump/make noises with attempt to move her arm PROM, but reports no pain RUE Coordination: decreased fine motor;decreased gross motor LUE Deficits / Details: grossly 3/5--can self feed with this arm           Communication Communication Communication:  (dementia)   Cognition Arousal: Alert Behavior During Therapy: Flat affect Cognition: History of cognitive impairments             OT - Cognition Comments: could tell us she was at the hospital but not what one                 Following commands: Impaired Following commands impaired: Follows one step commands inconsistently, Follows one step commands with increased time     Cueing   Cueing Techniques: Verbal cues;Gestural cues;Tactile cues;Visual cues              Home Living Family/patient expects to be discharged to:: Private residence Living Arrangements: Children Available Help at Discharge: Family Type of Home: Apartment Home Access: Stairs to enter Secretary/administrator of Steps: flight Entrance Stairs-Rails: Right Home Layout: One level         Firefighter: Standard         Additional Comments: has a recliner next to bed, bedside table to eat from; bed bath      Prior Functioning/Environment Prior Level of Function : Needs assist             Mobility Comments: dtr reports she physically lifts her for transfers, but pt can get up to the EOB by herself and roll in bed by herself; dtr gets her up in recliner every day      OT Problem List: Decreased strength;Decreased range of motion;Impaired balance (sitting and/or standing);Impaired vision/perception;Decreased coordination;Decreased cognition;Decreased safety awareness;Impaired tone;Impaired UE functional use;Pain   OT  Treatment/Interventions: Self-care/ADL training;Patient/family education;Balance training;Therapeutic exercise      OT Goals(Current goals can be found in the care plan section)   Acute Rehab OT Goals Patient Stated Goal: dtr wants to keep what she has strong--would like for her to be able to return to feeding herself and being able to roll and sit up by herself OT Goal Formulation: With family Time For Goal Achievement: 07/08/23 Potential to Achieve Goals: Fair   OT Frequency:  Min 1X/week    Co-evaluation PT/OT/SLP Co-Evaluation/Treatment: Yes Reason for Co-Treatment: For patient/therapist safety;Necessary to address cognition/behavior during functional activity;To address functional/ADL transfers PT goals addressed during session: Mobility/safety with mobility;Balance;Strengthening/ROM OT goals addressed during session: Strengthening/ROM;ADL's and self-care      AM-PAC OT "6 Clicks" Daily Activity     Outcome Measure Help from another person eating meals?: A Lot (due to swallowing precautions) Help from another person taking care of personal grooming?: A Lot Help from another person toileting, which includes using toliet, bedpan, or urinal?: Total Help from another person bathing (including washing, rinsing, drying)?: Total Help from another person to put on and taking off regular upper body clothing?: Total Help from another  person to put on and taking off regular lower body clothing?: Total 6 Click Score: 8   End of Session Nurse Communication: Mobility status (lift pad under her)  Activity Tolerance: Patient tolerated treatment well Patient left: in chair;with call bell/phone within reach;with chair alarm set  OT Visit Diagnosis: Other abnormalities of gait and mobility (R26.89);Muscle weakness (generalized) (M62.81);Hemiplegia and hemiparesis;Pain Hemiplegia - Right/Left: Right Hemiplegia - dominant/non-dominant: Dominant Hemiplegia - caused by: Cerebral  infarction Pain - Right/Left: Right Pain - part of body: Arm;Hand (with some PROM)                Time: 1610-9604 OT Time Calculation (min): 34 min Charges:  OT General Charges $OT Visit: 1 Visit OT Evaluation $OT Eval Moderate Complexity: 1 Mod  Cathy L. OT Acute Rehabilitation Services Office [phone]    Evette Georges 06/17/2023, 11:03 AM

## 2023-06-17 NOTE — Evaluation (Signed)
 Physical Therapy Evaluation Patient Details Name: Olivia Ross MRN: [id number] DOB: 01-Nov-1945 Today's Date: 06/17/2023  History of Present Illness  78 y.o. female presents to ED 06/14/23 for Code Stroke, left gaze and aphasic, questionable seizure. EEG showing potential moderate diffuse encephalopathy with no apparent seizure activity.  Intubated <24 hours  PMH: dementia, HTN, right breast cancer, R MCA CVA, L MCA CVA  Clinical Impression  Pt admitted with above diagnosis. Pt from home with her daughter who lifts her to her recliner each day where pt spends most of day. Daughter relayed to OT that pt can usually sit up to EOB on her own, unable to do this today and needed tot A +2. Needed CGA up to max A to maintain sitting EOB, occasional R lean with LUE pushing to R. Will follow acutely and recommend HHPT at d/c.  Pt currently with functional limitations due to the deficits listed below (see PT Problem List). Pt will benefit from acute skilled PT to increase their independence and safety with mobility to allow discharge.           If plan is discharge home, recommend the following: A lot of help with walking and/or transfers;A lot of help with bathing/dressing/bathroom;Assistance with cooking/housework;Assist for transportation;Help with stairs or ramp for entrance;Supervision due to cognitive status   Can travel by private vehicle        Equipment Recommendations Hospital bed  Recommendations for Other Services       Functional Status Assessment Patient has had a recent decline in their functional status and demonstrates the ability to make significant improvements in function in a reasonable and predictable amount of time.     Precautions / Restrictions Precautions Precautions: Fall Precaution/Restrictions Comments: multiple bony prominences--currently covered wtih Mepilix Restrictions Weight Bearing Restrictions Per Provider Order: No      Mobility  Bed Mobility Overal bed  mobility: Needs Assistance Bed Mobility: Supine to Sit     Supine to sit: Total assist, +2 for physical assistance          Transfers Overall transfer level: Needs assistance   Transfers: Bed to chair/wheelchair/BSC            Lateral/Scoot Transfers: Total assist, +2 physical assistance General transfer comment: use of pad from bed to drop arm recliner    Ambulation/Gait               General Gait Details: pt does not ambulate  Stairs            Wheelchair Mobility     Tilt Bed    Modified Rankin (Stroke Patients Only)       Balance Overall balance assessment: Needs assistance Sitting-balance support: Single extremity supported, Feet supported   Sitting balance - Comments: varied from fair to zero, at times she would push with LUE and resisted coming back up to midline from right forearm propping                                     Pertinent Vitals/Pain Pain Assessment Pain Assessment: PAINAD Breathing: normal Negative Vocalization: occasional moan/groan, low speech, negative/disapproving quality (when attempting some PROM of RUE) Facial Expression: sad, frightened, frown (when attempting some PROM of RUE) Body Language: relaxed Consolability: no need to console PAINAD Score: 2 Facial Expression: Relaxed, neutral Body Movements: Absence of movements Muscle Tension: Relaxed Compliance with ventilator (intubated pts.): N/A Vocalization (extubated pts.): Talking in  normal tone or no sound CPOT Total: 0 Pain Intervention(s): Limited activity within patient's tolerance    Home Living Family/patient expects to be discharged to:: Private residence Living Arrangements: Children Available Help at Discharge: Family Type of Home: Apartment Home Access: Stairs to enter Entrance Stairs-Rails: Right Entrance Stairs-Number of Steps: flight   Home Layout: One level   Additional Comments: has a recliner next to bed, bedside table to  eat from; bed bath    Prior Function Prior Level of Function : Needs assist             Mobility Comments: dtr reports she physically lifts her for transfers, but pt can get up to the EOB by herself and roll in bed by herself; dtr gets her up in recliner every day ADLs Comments: assist needed for bed bath     Extremity/Trunk Assessment   Upper Extremity Assessment Upper Extremity Assessment: Defer to OT evaluation RUE Deficits / Details: contractures (flexed fingers, flexed elbow, adducted arm); can move it spontaneously but does tend to jump/make noises with attempt to move her arm PROM, but reports no pain RUE Coordination: decreased fine motor;decreased gross motor LUE Deficits / Details: grossly 3/5--can self feed with this arm    Lower Extremity Assessment Lower Extremity Assessment: Generalized weakness;RLE deficits/detail;Difficult to assess due to impaired cognition RLE Deficits / Details: pt very thin, LLE stronger than RLE, generalized weakness noted throughout       Communication   Communication Communication:  (dementia)    Cognition Arousal: Alert Behavior During Therapy: Flat affect   PT - Cognitive impairments: History of cognitive impairments                       PT - Cognition Comments: follows about 50% of commands Following commands: Impaired Following commands impaired: Follows one step commands inconsistently, Follows one step commands with increased time     Cueing Cueing Techniques: Verbal cues, Gestural cues, Tactile cues, Visual cues     General Comments General comments (skin integrity, edema, etc.): mepilex covering bony prominences. Pt leaning R in chair, propped with pillows. SPO2 95%, HR 102 bpm    Exercises     Assessment/Plan    PT Assessment Patient needs continued PT services  PT Problem List Decreased strength;Decreased range of motion;Decreased activity tolerance;Decreased balance;Decreased mobility;Decreased  coordination;Decreased cognition       PT Treatment Interventions Functional mobility training;Therapeutic activities;Therapeutic exercise;Patient/family education    PT Goals (Current goals can be found in the Care Plan section)  Acute Rehab PT Goals Patient Stated Goal: none stated PT Goal Formulation: Patient unable to participate in goal setting Time For Goal Achievement: 07/01/23 Potential to Achieve Goals: Fair    Frequency Min 1X/week     Co-evaluation PT/OT/SLP Co-Evaluation/Treatment: Yes Reason for Co-Treatment: For patient/therapist safety;Necessary to address cognition/behavior during functional activity;To address functional/ADL transfers PT goals addressed during session: Mobility/safety with mobility;Balance;Strengthening/ROM OT goals addressed during session: Strengthening/ROM;ADL's and self-care       AM-PAC PT "6 Clicks" Mobility  Outcome Measure Help needed turning from your back to your side while in a flat bed without using bedrails?: Total Help needed moving from lying on your back to sitting on the side of a flat bed without using bedrails?: Total Help needed moving to and from a bed to a chair (including a wheelchair)?: Total Help needed standing up from a chair using your arms (e.g., wheelchair or bedside chair)?: Total Help needed to walk in hospital  room?: Total Help needed climbing 3-5 steps with a railing? : Total 6 Click Score: 6    End of Session   Activity Tolerance: Patient tolerated treatment well Patient left: in chair;with call bell/phone within reach;with chair alarm set Nurse Communication: Mobility status PT Visit Diagnosis: Muscle weakness (generalized) (M62.81)    Time: 8657-8469 PT Time Calculation (min) (ACUTE ONLY): 34 min   Charges:   PT Evaluation $PT Eval Moderate Complexity: 1 Mod   PT General Charges $$ ACUTE PT VISIT: 1 Visit         Lyanne Co, PT  Acute Rehab Services Secure chat preferred Office  [phone]   Lawana Chambers Tyrihanna Wingert 06/17/2023, 12:20 PM

## 2023-06-17 NOTE — Progress Notes (Signed)
 Speech Language Pathology Treatment: Dysphagia  Patient Details Name: Olivia Ross MRN: [id number] DOB: 03/15/1946 Today's Date: 06/17/2023 Time: 2952-8413 SLP Time Calculation (min) (ACUTE ONLY): 12 min  Assessment / Plan / Recommendation Clinical Impression  Pt sitting upright in the chair with an anteriorly leaning head posture throughout the majority of the session. She had intermittent oral holding with thin liquids and purees. Oral transit was significantly prolonged with simulated Dys 2 solids, requiring multiple sips of thin liquids to assist in clearing. Suspect pt's presentation may fluctuate throughout the day due to cognitive factors. Recommend continuing current diet of Dys 1 solids with thin liquids and full supervision. SLP will f/u at least briefly, although suspect a pureed diet may remain most appropriate during acute stay.    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  Family thought she may have UTI.  Patient initially alert then had a seizure en route to hospital.  EMS gave 5mg  IM Versed, but then she became apneic and she was intubated (Etomidate and Succinylcholine) in the ED.  She was diagnosed with hypoxic respiratory failure requiring intubation, seizure disorder, lactic acidosis and hypokalemia.  Most recent CT of the head was showing no acute findings with multiple chronic infarcts.      SLP Plan  Continue with current plan of care      Recommendations for follow up therapy are one component of a multi-disciplinary discharge planning process, led by the attending physician.  Recommendations may be updated based on patient status, additional functional criteria and insurance authorization.    Recommendations  Diet recommendations: Dysphagia 1 (puree);Thin liquid Liquids provided via: Cup;Straw Medication Administration: Crushed with  puree Supervision: Full supervision/cueing for compensatory strategies Compensations: Slow rate;Small sips/bites Postural Changes and/or Swallow Maneuvers: Seated upright 90 degrees                  Oral care BID   Frequent or constant Supervision/Assistance Dysphagia, oropharyngeal phase (R13.12)     Continue with current plan of care     Gwynneth Aliment, M.A., CF-SLP Speech Language Pathology, Acute Rehabilitation Services  Secure Chat preferred [phone]   06/17/2023, 2:46 PM

## 2023-06-17 NOTE — Evaluation (Signed)
 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) 06/14/2023   Protein-calorie malnutrition, severe 02/04/2022   Acute CVA (cerebrovascular accident) (HCC) 02/03/2022   Dyslipidemia 02/03/2022   Transient alteration of awareness 08/26/2021   Sinus bradycardia 08/26/2021   Hypertension    History of CVA (cerebrovascular accident) 02/02/2021   Generalized weakness    Malignant neoplasm of upper-outer quadrant of right breast in female, estrogen receptor positive (HCC) 08/05/2019   Dementia without behavioral disturbance (HCC) 07/07/2019   Past Medical History:  Past Medical History:  Diagnosis Date   Breast cancer (HCC)    CVA (cerebral vascular accident) (HCC)    Dementia (HCC)    Hypertension    Seizure (HCC) 07/2020   Past Surgical History: History reviewed. No pertinent surgical history. 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  Family thought she may have UTI.  Patient initially alert then had a seizure en route to hospital.  EMS gave 5mg  IM Versed, but then she became apneic and she was intubated (Etomidate and Succinylcholine) in the ED.  She was diagnosed with hypoxic respiratory failure requiring intubation, seizure disorder, lactic acidosis and hypokalemia.  Most recent CT of the head was showing no acute findings with multiple chronic infarcts.   Assessment / Plan / Recommendation Clinical Impression  Pt presents with cognitive deficits related to memory, attention, auditory comprehension, and problem solving observed during functional tasks. She is disoriented and unable to reorient herself even if given  binary choices or yes/no questions. Pt has difficulty sustaining attention to tasks, requiring Max cueing. Given history of CVA in addition to baseline dementia, suspect pt's presentation is close to her baseline although pt is a questionable historian and no family was present to confirm PLOF. No further SLP f/u is necessary acutely, will sign off.    SLP Assessment  SLP Recommendation/Assessment: All further Speech Lanaguage Pathology  needs can be addressed in the next venue of care SLP Visit Diagnosis: Cognitive communication deficit (R41.841)    Recommendations for follow up therapy are one component of a multi-disciplinary discharge planning process, led by the attending physician.  Recommendations may be updated based on patient status, additional functional criteria and insurance authorization.    Follow Up Recommendations  Skilled nursing-short term rehab (<3 hours/day)    Assistance Recommended at Discharge  Frequent or constant Supervision/Assistance  Functional Status Assessment Patient has had a recent decline in their functional status and demonstrates the ability to make significant improvements in function in a reasonable and predictable amount of time.  Frequency and Duration           SLP Evaluation Cognition  Overall Cognitive Status: History of cognitive impairments - at baseline Arousal/Alertness: Awake/alert Orientation Level: Oriented to person;Disoriented to place;Disoriented to time;Disoriented to situation Attention: Sustained Sustained Attention: Impaired Sustained Attention Impairment: Functional basic Memory: Impaired Awareness: Impaired Awareness Impairment: Intellectual impairment Problem Solving: Impaired Problem Solving Impairment: Functional basic       Comprehension  Auditory Comprehension Overall Auditory Comprehension: Impaired Yes/No Questions: Not tested Commands: Impaired One Step Basic Commands: 25-49% accurate    Expression Verbal  Expression Overall Verbal Expression: Impaired  Oral / Motor  Oral Motor/Sensory Function Overall Oral Motor/Sensory Function: Within functional limits Motor Speech Overall Motor Speech: Appears within functional limits for tasks assessed            Gwynneth Aliment, M.A., CF-SLP Speech Language Pathology, Acute Rehabilitation Services  Secure Chat preferred [phone]  06/17/2023, 3:04 PM

## 2023-06-18 DIAGNOSIS — G40409 Other generalized epilepsy and epileptic syndromes, not intractable, without status epilepticus: Secondary | ICD-10-CM | POA: Diagnosis not present

## 2023-06-18 LAB — COMPREHENSIVE METABOLIC PANEL
ALT: 41 U/L (ref 0–44)
AST: 46 U/L — ABNORMAL HIGH (ref 15–41)
Albumin: 2.6 g/dL — ABNORMAL LOW (ref 3.5–5.0)
Alkaline Phosphatase: 98 U/L (ref 38–126)
Anion gap: 6 (ref 5–15)
BUN: 12 mg/dL (ref 8–23)
CO2: 27 mmol/L (ref 22–32)
Calcium: 9.2 mg/dL (ref 8.9–10.3)
Chloride: 103 mmol/L (ref 98–111)
Creatinine, Ser: 0.6 mg/dL (ref 0.44–1.00)
GFR, Estimated: >60 mL/min (ref >60)
Glucose, Bld: 102 mg/dL — ABNORMAL HIGH (ref 70–99)
Potassium: 3.9 mmol/L (ref 3.5–5.1)
Sodium: 136 mmol/L (ref 135–145)
Total Bilirubin: 0.4 mg/dL (ref 0.0–1.2)
Total Protein: 5 g/dL — ABNORMAL LOW (ref 6.5–8.1)

## 2023-06-18 LAB — GLUCOSE, CAPILLARY
Glucose-Capillary: 98 mg/dL (ref 70–99)
Glucose-Capillary: 70 mg/dL (ref 70–99)
Glucose-Capillary: 14 mg/dL — CL (ref 70–99)
Glucose-Capillary: <10 mg/dL — CL (ref 70–99)
Glucose-Capillary: 94 mg/dL (ref 70–99)
Glucose-Capillary: 31 mg/dL — CL (ref 70–99)
Glucose-Capillary: 40 mg/dL — CL (ref 70–99)
Glucose-Capillary: <10 mg/dL — CL (ref 70–99)
Glucose-Capillary: 13 mg/dL — CL (ref 70–99)
Glucose-Capillary: 190 mg/dL — ABNORMAL HIGH (ref 70–99)
Glucose-Capillary: 265 mg/dL — ABNORMAL HIGH (ref 70–99)
Glucose-Capillary: 39 mg/dL — CL (ref 70–99)

## 2023-06-18 LAB — CBC
HCT: 33.5 % — ABNORMAL LOW (ref 36.0–46.0)
Hemoglobin: 11.3 g/dL — ABNORMAL LOW (ref 12.0–15.0)
MCH: 33 pg (ref 26.0–34.0)
MCHC: 33.7 g/dL (ref 30.0–36.0)
MCV: 98 fL (ref 80.0–100.0)
Platelets: 136 10*3/uL — ABNORMAL LOW (ref 150–400)
RBC: 3.42 MIL/uL — ABNORMAL LOW (ref 3.87–5.11)
RDW: 14.6 % (ref 11.5–15.5)
WBC: 5.3 10*3/uL (ref 4.0–10.5)
nRBC: 0 % (ref 0.0–0.2)

## 2023-06-18 LAB — MAGNESIUM: Magnesium: 2.2 mg/dL (ref 1.7–2.4)

## 2023-06-18 LAB — PHOSPHORUS: Phosphorus: 1.3 mg/dL — ABNORMAL LOW (ref 2.5–4.6)

## 2023-06-18 LAB — TSH: TSH: 6.258 u[IU]/mL — ABNORMAL HIGH (ref 0.350–4.500)

## 2023-06-18 MED ORDER — DEXTROSE 5 % IV SOLN: INTRAVENOUS | Status: DC

## 2023-06-18 MED ORDER — DEXTROSE 50 % IV SOLN
INTRAVENOUS | Status: AC
  Administered 2023-06-18: 50 mL
  Filled 2023-06-18: qty 50

## 2023-06-18 MED ORDER — DEXTROSE 10 % IV SOLN: INTRAVENOUS | Status: DC

## 2023-06-18 MED ORDER — POTASSIUM PHOSPHATES 15 MMOLE/5ML IV SOLN
30.0000 mmol | Freq: Once | INTRAVENOUS | Status: AC
  Administered 2023-06-18: 30 mmol via INTRAVENOUS
  Filled 2023-06-18: qty 10

## 2023-06-18 MED ORDER — DEXTROSE 50 % IV SOLN
INTRAVENOUS | Status: AC
Start: 2023-06-18 — End: 2023-06-18
  Administered 2023-06-18: 50 mL
  Filled 2023-06-18: qty 50

## 2023-06-18 MED ORDER — DEXTROSE 50 % IV SOLN
INTRAVENOUS | Status: AC
  Filled 2023-06-18: qty 50

## 2023-06-18 NOTE — Progress Notes (Signed)
 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  History of seizures.  Patient initially alert but had a seizure and out of the hospital.  She subsequently required intubation in the ED and admission to ICU.  Neurology was consulted.  She was started on Keppra.  She was subsequently extubated.  LTM EEG negative for seizures.  Palliative care consulted for goals of care discussion.  MRI brain showed no acute stroke but showed old strokes.  Neurology signed off on 06/16/2023 and recommended outpatient follow-up with neurology.  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  She was transferred to [facility] service from 06/16/2023 onwards.  Assessment & Plan:   Seizure -Symptoms possibly due to seizure activity. -Neurology evaluation and follow-up appreciated.  LTM EEG negative and MRI of brain did not show any acute findings. -Continue Keppra.  Neurology signed off on 06/16/2023.  Outpatient follow-up with neurology. 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   -MRI of abdomen showed a 2.2 x 2.1 cm lesion in the pancreatic head raising concern for neoplasm. -Prior hospitalist discussed these findings with patient's daughter: Because of patient's dementia and other comorbidities, patient is not a candidate for any treatments and may not truly benefit from procedures such as biopsy.  Daughter is also contemplating the same.  Goals of care -Palliative care evaluation appreciated.  Currently remains full code. -Daughter was strongly urged to consider changing CODE STATUS by  prior hospitalist: Daughter will get back to [place] on that -Overall prognosis is guarded to poor.  Hypoglycemia -From poor oral intake.  Treated with D5 infusion and subsequently discontinued.  Monitor blood sugars.  Encourage oral intake  Constipation -Continue bowel regimen  History of vascular dementia -Fall precautions.  PT/OT recommending home health PT/OT  Anemia of chronic disease -From chronic illnesses.  Hemoglobin currently stable.  Monitor intermittently  Thrombocytopenia -No signs of bleeding.  Monitor intermittently  Oropharyngeal dysphagia -Diet as per SLP recommendations   DVT prophylaxis: Subcutaneous heparin Code Status: Full Family Communication: None at bedside Disposition Plan: Status is: Inpatient Remains inpatient appropriate because: Of severity of illness   Consultants: Neurology/PCCM/palliative care  Procedures: As above  Antimicrobials: None   Subjective: Patient seen and examined at bedside.  Poor historian.  No fever, seizures, agitation reported.  Objective: Vitals:   06/17/23 1627 06/17/23 2009 06/17/23 2318 06/18/23 0422  BP: 138/68 (!) 147/70 (!) 154/93 130/71  Pulse: 73  (!) 113   Resp: 13 20 20 18   Temp: 98.2 F (36.8 C) 98.1 F (36.7 C) 98.3 F (36.8 C) 98.3 F (36.8 C)  TempSrc: Oral Oral Oral Oral  SpO2:   100%   Weight:    39.5 kg  Height:        Intake/Output Summary (Last 24 hours) at 06/18/2023 0812 Last data filed at 06/18/2023 0424 Gross per 24 hour  Intake 1931.67 ml  Output 1500 ml  Net 431.67 ml   Filed Weights   06/16/23 0423 06/17/23 0418 06/18/23 0422  Weight: 41.4 kg 39.6 kg 39.5 kg  Examination:  General exam: Appears calm and comfortable.  Looks chronically ill and deconditioned.  Extremely thinly built. Respiratory system: Bilateral decreased breath sounds at bases Cardiovascular system: S1 & S2 heard, mild intermittent tachycardia present  gastrointestinal system: Abdomen is nondistended, soft  and nontender. Normal bowel sounds heard. Extremities: No cyanosis, clubbing, edema  Central nervous system: Wakes up slightly, slow to respond, poor historian.  No obvious focal deficits noted Skin: No rashes, lesions or ulcers Psychiatry: Flat affect.  Not agitated..     Data Reviewed: I have personally reviewed following labs and imaging studies  CBC: Recent Labs  Lab 06/14/23 0423 06/14/23 0529 06/15/23 0232 06/16/23 0417 06/17/23 0437 06/18/23 0325  WBC 4.1  --  7.7 4.9 5.7 5.3  NEUTROABS 3.0  --   --   --   --   --   HGB 9.8* 11.9* 10.7* 11.1* 10.8* 11.3*  HCT 28.4* 35.0* 31.9* 33.7* 32.8* 33.5*  MCV 95.9  --  96.7 98.0 98.2 98.0  PLT 97*  --  120* 104* 113* 136*   Basic Metabolic Panel: Recent Labs  Lab 06/14/23 2330 06/15/23 0232 06/16/23 0417 06/17/23 0437 06/18/23 0325  NA 141 143 139 138 136  K 4.1 4.4 3.0* 3.1* 3.9  CL 107 108 104 104 103  CO2 25 30 26 27 27   GLUCOSE 79 92 109* 69* 102*  BUN 11 8 8  7* 12  CREATININE 0.59 0.49 0.53 0.56 0.60  CALCIUM 9.2 9.3 8.9 8.8* 9.2  MG  --  1.8 1.9 1.6* 2.2  PHOS  --  1.7* 2.6 1.2* 1.3*   GFR: Estimated Creatinine Clearance: 35.6 mL/min (by C-G formula based on SCr of 0.6 mg/dL). Liver Function Tests: Recent Labs  Lab 06/14/23 0423 06/15/23 0232 06/16/23 0417 06/17/23 0437 06/18/23 0325  AST 33 31 28 29  46*  ALT 22 25 23 26  41  ALKPHOS 66 64 58 70 98  BILITOT 0.4 0.6 0.7 0.5 0.4  PROT 3.8* 4.9* 5.3* 5.0* 5.0*  ALBUMIN 1.9* 2.7* 2.8* 2.6* 2.6*   No results for input(s): "LIPASE", "AMYLASE" in the last 168 hours. No results for input(s): "AMMONIA" in the last 168 hours. Coagulation Profile: Recent Labs  Lab 06/14/23 0423  INR 1.3*   Cardiac Enzymes: No results for input(s): "CKTOTAL", "CKMB", "CKMBINDEX", "TROPONINI" in the last 168 hours. BNP (last 3 results) No results for input(s): "PROBNP" in the last 8760 hours. HbA1C: No results for input(s): "HGBA1C" in the last 72 hours. CBG: Recent  Labs  Lab 06/17/23 1132 06/17/23 1630 06/17/23 2009 06/17/23 2351 06/18/23 0421  GLUCAP 75 121* 104* 119* 98   Lipid Profile: No results for input(s): "CHOL", "HDL", "LDLCALC", "TRIG", "CHOLHDL", "LDLDIRECT" in the last 72 hours. Thyroid Function Tests: Recent Labs    06/18/23 0325  TSH 6.258*   Anemia Panel: No results for input(s): "VITAMINB12", "FOLATE", "FERRITIN", "TIBC", "IRON", "RETICCTPCT" in the last 72 hours. Sepsis Labs: Recent Labs  Lab 06/14/23 0551 06/14/23 0931  LATICACIDVEN 3.0* 1.8    Recent Results (from the past 240 hours)  Blood culture (routine x 2)     Status: None (Preliminary result)   Collection Time: 06/14/23  5:40 AM   Specimen: BLOOD RIGHT HAND  Result Value Ref Range Status   Specimen Description BLOOD RIGHT HAND  Final   Special Requests   Final    BOTTLES DRAWN AEROBIC AND ANAEROBIC Blood Culture adequate volume   Culture   Final    NO GROWTH 4 DAYS  Performed at [facility], [address], [place], [place] [address]    Report Status PENDING  Incomplete  Blood culture (routine x 2)     Status: None (Preliminary result)   Collection Time: 06/14/23  5:44 AM   Specimen: BLOOD RIGHT HAND  Result Value Ref Range Status   Specimen Description BLOOD RIGHT HAND  Final   Special Requests   Final    BOTTLES DRAWN AEROBIC AND ANAEROBIC Blood Culture results may not be optimal due to an inadequate volume of blood received in culture bottles   Culture   Final    NO GROWTH 4 DAYS Performed at [facility], [address], [place], [place] [address]    Report Status PENDING  Incomplete  Resp panel by RT-PCR (RSV, Flu A&B, Covid) Anterior Nasal Swab     Status: None   Collection Time: 06/14/23  6:45 AM   Specimen: Anterior Nasal Swab  Result Value Ref Range Status   SARS Coronavirus 2 by RT PCR NEGATIVE NEGATIVE Final   Influenza A by PCR NEGATIVE NEGATIVE Final   Influenza B by PCR NEGATIVE NEGATIVE Final    Comment: (NOTE) The  Xpert Xpress SARS-CoV-2/FLU/RSV plus assay is intended as an aid in the diagnosis of influenza from Nasopharyngeal swab specimens and should not be used as a sole basis for treatment. Nasal washings and aspirates are unacceptable for Xpert Xpress SARS-CoV-2/FLU/RSV testing.  Fact Sheet for Patients: BloggerCourse.com  Fact Sheet for Healthcare Providers: SeriousBroker.it  This test is not yet approved or cleared by the [place] FDA and has been authorized for detection and/or diagnosis of SARS-CoV-2 by FDA under an Emergency Use Authorization (EUA). This EUA will remain in effect (meaning this test can be used) for the duration of the COVID-19 declaration under Section 564(b)(1) of the Act, 21 U.S.C. section 360bbb-3(b)(1), unless the authorization is terminated or revoked.     Resp Syncytial Virus by PCR NEGATIVE NEGATIVE Final    Comment: (NOTE) Fact Sheet for Patients: BloggerCourse.com  Fact Sheet for Healthcare Providers: SeriousBroker.it  This test is not yet approved or cleared by the [place] FDA and has been authorized for detection and/or diagnosis of SARS-CoV-2 by FDA under an Emergency Use Authorization (EUA). This EUA will remain in effect (meaning this test can be used) for the duration of the COVID-19 declaration under Section 564(b)(1) of the Act, 21 U.S.C. section 360bbb-3(b)(1), unless the authorization is terminated or revoked.  Performed at [facility], [address] [place], [place] [address]   MRSA Next Gen by PCR, Nasal     Status: None   Collection Time: 06/14/23  2:15 PM   Specimen: Nasal Mucosa; Nasal Swab  Result Value Ref Range Status   MRSA by PCR Next Gen NOT DETECTED NOT DETECTED Final    Comment: (NOTE) The GeneXpert MRSA Assay (FDA approved for NASAL specimens only), is one component of a comprehensive MRSA colonization  surveillance program. It is not intended to diagnose MRSA infection nor to guide or monitor treatment for MRSA infections. Test performance is not FDA approved in patients less than 57 years old. Performed at [facility], [address] [place], [place] [address]          Radiology Studies: MR ABDOMEN W WO CONTRAST Result Date: 06/17/2023 CLINICAL DATA:  Abdominal mass identified by prior CT EXAM: MRI ABDOMEN WITHOUT AND WITH CONTRAST TECHNIQUE: Multiplanar multisequence MR imaging of the abdomen was performed both  before and after the administration of intravenous contrast. CONTRAST:  4mL GADAVIST GADOBUTROL 1 MMOL/ML IV SOLN COMPARISON:  CT abdomen pelvis, 06/15/2023 FINDINGS: Examination is significantly limited by breath motion artifact throughout, particularly limiting multiphasic contrast enhanced sequences. Lower chest: No acute abnormality. Hepatobiliary: No focal liver abnormality is seen. Status post cholecystectomy. No biliary dilatation. Pancreas: Rounded, circumscribed appearing lesion in the central pancreatic head measuring 2.2 x 2.1 cm (series 10, image 22). This appears to demonstrate some degree of contrast enhancement however evaluation is substantially limited by pervasive breath motion artifact degrading multiphasic enhanced sequences. Incidental note of pancreas divisum. No pancreatic ductal dilatation or surrounding inflammatory changes. Spleen: Normal in size without significant abnormality. Adrenals/Urinary Tract: Adrenal glands are unremarkable. Kidneys are normal, without renal calculi, solid lesion, or hydronephrosis. Stomach/Bowel: Stomach is within normal limits. No evidence of bowel wall thickening, distention, or inflammatory changes. Large burden of stool in the included colon. Vascular/Lymphatic: Severe aortic atherosclerosis. No enlarged abdominal lymph nodes. Other: No abdominal wall hernia or abnormality. No ascites. Musculoskeletal: Diffusely  heterogeneous, abnormal appearance of the vertebral and pelvic bone marrow (series 4, image 16). IMPRESSION: 1. Examination is significantly limited by breath motion artifact throughout, particularly limiting multiphasic contrast enhanced sequences. 2. Rounded, circumscribed appearing lesion in the central pancreatic head measuring 2.2 x 2.1 cm. This appears to demonstrate some degree of contrast enhancement however evaluation is substantially limited by pervasive breath motion artifact degrading multiphasic enhanced sequences. This is concerning for pancreatic neoplasm, although not typical in appearance for adenocarcinoma, possibly a neuroendocrine tumor. Consider EUS/FNA for further assessment. 3. Diffusely heterogeneous, abnormal appearance of the bone marrow, of uncertain significance although worrisome for diffuse osseous metastatic disease. 4. Pancreas divisum. 5. Status post cholecystectomy. 6. Large burden of stool. Aortic Atherosclerosis (ICD10-I70.0). Electronically Signed   By: Jearld Lesch M.D.   On: 06/17/2023 08:26        Scheduled Meds:  amLODipine  5 mg Oral Daily   Chlorhexidine Gluconate Cloth  6 each Topical Daily   feeding supplement  237 mL Oral BID BM   folic acid  1 mg Oral Daily   heparin  5,000 Units Subcutaneous Q8H   irbesartan  75 mg Oral Daily   mouth rinse  15 mL Mouth Rinse 4 times per day   pantoprazole  40 mg Oral Q1200   polyethylene glycol  17 g Oral BID   senna-docusate  2 tablet Oral BID   Continuous Infusions:  levETIRAcetam Stopped (06/18/23 0358)          Glade Lloyd, MD [facility] 06/18/2023, 8:12 AM

## 2023-06-18 NOTE — Progress Notes (Signed)
 Hypoglycemic Event  CBG: 40  Treatment: 8 oz juice/soda  Symptoms: None  Follow-up CBG: Time:1717 CBG Result:31  Possible Reasons for Event: Inadequate meal intake    Hypoglycemic Event  CBG: 31  Treatment: D50 50 mL (25 gm)  Symptoms: None  Follow-up CBG: Time:1742 CBG Result:39  Possible Reasons for Event: Inadequate meal intake  Comments/MD notified: Dr. Hanley Ben notified fluids increased    Hypoglycemic Event  CBG: 39  Treatment: D50 50 mL (25 gm)  Symptoms: None  Follow-up CBG: Time:1814 CBG Result:190  Possible Reasons for Event: Inadequate meal intake     Darby Shadwick M Rio Taber

## 2023-06-18 NOTE — Plan of Care (Signed)
   Problem: Clinical Measurements: Goal: Respiratory complications will improve Outcome: Progressing

## 2023-06-18 NOTE — Progress Notes (Signed)
 Hypoglycemic Event  CBG: 14  Treatment: D50 50 mL (25 gm)   Symptoms: None  Follow-up CBG: Time:1248 CBG Result:94  Possible Reasons for Event: Inadequate meal intake  Comments/MD notified:Dr. Hanley Ben notified D5 @ 75 mL/hr started     Nadea Kirkland M Bastien Strawser

## 2023-06-18 NOTE — Progress Notes (Signed)
 Occupational Therapy Treatment Patient Details Name: Olivia Ross MRN: [id number] DOB: December 07, 1945 Today's Date: 06/18/2023   History of present illness 78 y.o. female presents to ED 06/14/23 for Code Stroke, left gaze and aphasic, questionable seizure. EEG showing potential moderate diffuse encephalopathy with no apparent seizure activity.  Intubated <24 hours  PMH: dementia, HTN, right breast cancer, R MCA CVA, L MCA CVA   OT comments  Patient received on left side in bed and asleep but woke up once place on her back. Palm guard placed on right hand with patient demonstrating discomfort until complete and patient indicated on pain. Patient placed in chair position to address self feeding. Patient with difficulty holding onto utensil and red foam added to spoon with increased grip/control. Patient able to feed self with min assist and occasional mod assist when distracted. Face washing and gown change performed after feeding self and patient positioned for comfort. Acute OT to continue to follow to address established goals to facilitate DC to next venue of care.        If plan is discharge home, recommend the following:  Two people to help with walking and/or transfers;A lot of help with bathing/dressing/bathroom;Assistance with cooking/housework;Assistance with feeding;Assist for transportation;Help with stairs or ramp for entrance;Direct supervision/assist for financial management;Direct supervision/assist for medications management   Equipment Recommendations  Hospital bed    Recommendations for Other Services      Precautions / Restrictions Precautions Precautions: Fall Precaution/Restrictions Comments: multiple bony prominences--currently covered wtih Mepilix Restrictions Weight Bearing Restrictions Per Provider Order: No       Mobility Bed Mobility Overal bed mobility: Needs Assistance Bed Mobility: Rolling Rolling: Max assist         General bed mobility comments:  rolling for positioning in bed    Transfers                   General transfer comment: not attempted     Balance                                           ADL either performed or assessed with clinical judgement   ADL Overall ADL's : Needs assistance/impaired Eating/Feeding: Minimal assistance;Sitting;Bed level Eating/Feeding Details (indicate cue type and reason): occasional mod assist when distracted, Built up spoon for grip, assistance at [employer]: Wash/dry face;Moderate assistance;Sitting;Bed level Grooming Details (indicate cue type and reason): assistance to complete washing face                               General ADL Comments: self feeding in bed in chair position    Extremity/Trunk Assessment              Vision       Perception     Praxis     Communication     Cognition Arousal: Alert Behavior During Therapy: Flat affect Cognition: History of cognitive impairments             OT - Cognition Comments: no very vocal                 Following commands: Impaired Following commands impaired: Follows one step commands inconsistently, Follows one step commands with increased time      Cueing   Cueing Techniques: Verbal cues, Gestural cues, Visual cues  Exercises  Shoulder Instructions       General Comments palm guard placed on right hand, nursing informed and instructed to take off for hand hygiene and check for skin breakdown    Pertinent Vitals/ Pain       Pain Assessment Pain Assessment: Faces Faces Pain Scale: No hurt Pain Intervention(s): Monitored during session  Home Living                                          Prior Functioning/Environment              Frequency  Min 1X/week        Progress Toward Goals  OT Goals(current goals can now be found in the care plan section)  Progress towards OT goals: Progressing toward goals  Acute Rehab OT  Goals Patient Stated Goal: none stated OT Goal Formulation: Patient unable to participate in goal setting Time For Goal Achievement: 07/08/23 Potential to Achieve Goals: Fair ADL Goals Pt Will Perform Eating: with set-up;with supervision (supported sitting) Pt/caregiver will Perform Home Exercise Program: Increased strength;Left upper extremity;With written HEP provided (dominant/functional arm since CVA) Additional ADL Goal #1: Pt will be S up to EOB in prep for OOB Additional ADL Goal #2: Look at possible palm guard for right hand  Plan      Co-evaluation                 AM-PAC OT "6 Clicks" Daily Activity     Outcome Measure   Help from another person eating meals?: A Lot Help from another person taking care of personal grooming?: A Lot Help from another person toileting, which includes using toliet, bedpan, or urinal?: Total Help from another person bathing (including washing, rinsing, drying)?: Total Help from another person to put on and taking off regular upper body clothing?: Total Help from another person to put on and taking off regular lower body clothing?: Total 6 Click Score: 8    End of Session Equipment Utilized During Treatment: Other (comment) (right palm guard)  OT Visit Diagnosis: Other abnormalities of gait and mobility (R26.89);Muscle weakness (generalized) (M62.81);Hemiplegia and hemiparesis;Pain Hemiplegia - Right/Left: Right Hemiplegia - dominant/non-dominant: Dominant Hemiplegia - caused by: Cerebral infarction Pain - Right/Left: Right Pain - part of body: Hand (when placing palm guard on right hand)   Activity Tolerance Patient tolerated treatment well   Patient Left in bed;with call bell/phone within reach;with bed alarm set   Nurse Communication Mobility status;Other (comment) (right palm guard; percentage patient fed self)        Time: 7829-5621 OT Time Calculation (min): 43 min  Charges: OT General Charges $OT Visit: 1 Visit OT  Treatments $Self Care/Home Management : 23-37 mins $Therapeutic Activity: 8-22 mins  Olivia Ross, OTA Acute Rehabilitation Services  Office [phone]   Olivia Ross 06/18/2023, 1:48 PM

## 2023-06-19 DIAGNOSIS — G40409 Other generalized epilepsy and epileptic syndromes, not intractable, without status epilepticus: Secondary | ICD-10-CM | POA: Diagnosis not present

## 2023-06-19 LAB — BASIC METABOLIC PANEL
Anion gap: 7 (ref 5–15)
BUN: 14 mg/dL (ref 8–23)
CO2: 27 mmol/L (ref 22–32)
Calcium: 9 mg/dL (ref 8.9–10.3)
Chloride: 101 mmol/L (ref 98–111)
Creatinine, Ser: 0.58 mg/dL (ref 0.44–1.00)
GFR, Estimated: >60 mL/min (ref >60)
Glucose, Bld: 69 mg/dL — ABNORMAL LOW (ref 70–99)
Potassium: 4.2 mmol/L (ref 3.5–5.1)
Sodium: 135 mmol/L (ref 135–145)

## 2023-06-19 LAB — CULTURE, BLOOD (ROUTINE X 2)
Culture: NO GROWTH
Culture: NO GROWTH
Special Requests: ADEQUATE

## 2023-06-19 LAB — GLUCOSE, CAPILLARY
Glucose-Capillary: 162 mg/dL — ABNORMAL HIGH (ref 70–99)
Glucose-Capillary: 69 mg/dL — ABNORMAL LOW (ref 70–99)
Glucose-Capillary: 95 mg/dL (ref 70–99)
Glucose-Capillary: 81 mg/dL (ref 70–99)
Glucose-Capillary: 66 mg/dL — ABNORMAL LOW (ref 70–99)
Glucose-Capillary: 87 mg/dL (ref 70–99)
Glucose-Capillary: 78 mg/dL (ref 70–99)
Glucose-Capillary: 91 mg/dL (ref 70–99)

## 2023-06-19 LAB — MAGNESIUM: Magnesium: 1.8 mg/dL (ref 1.7–2.4)

## 2023-06-19 LAB — CANCER ANTIGEN 19-9: CA 19-9: 184 U/mL — ABNORMAL HIGH (ref 0–35)

## 2023-06-19 NOTE — Progress Notes (Signed)
 Hypoglycemic Event  CBG: 66  Treatment: 4 oz juice/soda  Symptoms: None  Follow-up CBG: Time:1304 CBG Result:87  Possible Reasons for Event: Inadequate meal intake    Olivia Ross M Tiney Zipper

## 2023-06-19 NOTE — Plan of Care (Signed)
   Problem: Education: Goal: Ability to describe self-care measures that may prevent or decrease complications (Diabetes Survival Skills Education) will improve Outcome: Progressing   Problem: Coping: Goal: Ability to adjust to condition or change in health will improve Outcome: Progressing   Problem: Fluid Volume: Goal: Ability to maintain a balanced intake and output will improve Outcome: Progressing

## 2023-06-19 NOTE — Progress Notes (Addendum)
 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  History of seizures.  Patient initially alert but had a seizure and out of the hospital.  She subsequently required intubation in the ED and admission to ICU.  Neurology was consulted.  She was started on Keppra.  She was subsequently extubated.  LTM EEG negative for seizures.  Palliative care consulted for goals of care discussion.  MRI brain showed no acute stroke but showed old strokes.  Neurology signed off on 06/16/2023 and recommended outpatient follow-up with neurology.  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  She was transferred to [facility] service from 06/16/2023 onwards.  Assessment & Plan:   Seizure -Symptoms possibly due to seizure activity. -Neurology evaluation and follow-up appreciated.  LTM EEG negative and MRI of brain did not show any acute findings. -Continue Keppra.  Neurology signed off on 06/16/2023.  Outpatient follow-up with neurology. 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   -MRI of abdomen showed a 2.2 x 2.1 cm lesion in the pancreatic head raising concern for neoplasm. -Prior hospitalist discussed these findings with patient's daughter: Because of patient's dementia and other comorbidities, patient is not a candidate for any treatments and may not truly benefit from procedures such as biopsy.  Daughter is also contemplating the same.  Goals of care -Palliative care evaluation appreciated.  Currently remains full code. -Daughter was strongly urged to consider changing CODE STATUS by  prior hospitalist: Daughter will get back to [place] on that -Overall prognosis is guarded to poor.  Hypoglycemia -From poor oral intake.  -Continues to have hypoglycemic episodes.  She has been switched to D10 infusion.  Constipation -Continue bowel regimen  History of vascular dementia -Fall precautions.  PT/OT recommending home health PT/OT  Anemia of chronic disease -From chronic illnesses.  Hemoglobin currently stable.  Monitor intermittently  Thrombocytopenia -No signs of bleeding.  Monitor intermittently  Oropharyngeal dysphagia -Diet as per SLP recommendations   DVT prophylaxis: Subcutaneous heparin Code Status: Full Family Communication: None at bedside. Called daughter on phone, went straight to voicemail Disposition Plan: Status is: Inpatient Remains inpatient appropriate because: Of severity of illness   Consultants: Neurology/PCCM/palliative care  Procedures: As above  Antimicrobials: None   Subjective: Patient seen and examined at bedside.  Poor historian.  No seizures, fever, agitation reported.  Nursing staff reported episodes of hypoglycemia yesterday and overnight.  Objective: Vitals:   06/18/23 2340 06/18/23 2344 06/19/23 0241 06/19/23 0430  BP: 136/68 136/68  (!) 144/57  Pulse:  86  89  Resp: 18 15  19   Temp:  97.9 F (36.6 C)    TempSrc:  Axillary  Axillary  SpO2:  99%  98%  Weight:   43 kg   Height:        Intake/Output Summary (Last 24 hours) at 06/19/2023 0709 Last data filed at 06/19/2023 0500 Gross per 24 hour  Intake 2150.4 ml  Output 1750 ml  Net 400.4 ml   Filed Weights   06/17/23 0418 06/18/23 0422 06/19/23 0241  Weight: 39.6 kg 39.5  kg 43 kg    Examination:  General: On room air.  No distress.  Chronically ill and deconditioned looking. ENT/neck: No thyromegaly.  JVD is not elevated  respiratory: Decreased breath sounds at bases bilaterally with some crackles; no wheezing  CVS: S1-S2 heard, rate controlled  currently Abdominal: Soft, nontender, slightly distended; no organomegaly, bowel sounds are heard Extremities: Trace lower extremity edema; no cyanosis  CNS: Still very slow to respond and a poor historian.  No focal deficits noted.   Lymph: No obvious lymphadenopathy Skin: No obvious ecchymosis/lesions  psych: Extremely flat affect.  Currently not agitated.  Musculoskeletal: No obvious joint swelling/deformity     Data Reviewed: I have personally reviewed following labs and imaging studies  CBC: Recent Labs  Lab 06/14/23 0423 06/14/23 0529 06/15/23 0232 06/16/23 0417 06/17/23 0437 06/18/23 0325  WBC 4.1  --  7.7 4.9 5.7 5.3  NEUTROABS 3.0  --   --   --   --   --   HGB 9.8* 11.9* 10.7* 11.1* 10.8* 11.3*  HCT 28.4* 35.0* 31.9* 33.7* 32.8* 33.5*  MCV 95.9  --  96.7 98.0 98.2 98.0  PLT 97*  --  120* 104* 113* 136*   Basic Metabolic Panel: Recent Labs  Lab 06/15/23 0232 06/16/23 0417 06/17/23 0437 06/18/23 0325 06/19/23 0328  NA 143 139 138 136 135  K 4.4 3.0* 3.1* 3.9 4.2  CL 108 104 104 103 101  CO2 30 26 27 27 27   GLUCOSE 92 109* 69* 102* 69*  BUN 8 8 7* 12 14  CREATININE 0.49 0.53 0.56 0.60 0.58  CALCIUM 9.3 8.9 8.8* 9.2 9.0  MG 1.8 1.9 1.6* 2.2 1.8  PHOS 1.7* 2.6 1.2* 1.3*  --    GFR: Estimated Creatinine Clearance: 38.7 mL/min (by C-G formula based on SCr of 0.58 mg/dL). Liver Function Tests: Recent Labs  Lab 06/14/23 0423 06/15/23 0232 06/16/23 0417 06/17/23 0437 06/18/23 0325  AST 33 31 28 29  46*  ALT 22 25 23 26  41  ALKPHOS 66 64 58 70 98  BILITOT 0.4 0.6 0.7 0.5 0.4  PROT 3.8* 4.9* 5.3* 5.0* 5.0*  ALBUMIN 1.9* 2.7* 2.8* 2.6* 2.6*   No results for input(s): "LIPASE", "AMYLASE" in the last 168 hours. No results for input(s): "AMMONIA" in the last 168 hours. Coagulation Profile: Recent Labs  Lab 06/14/23 0423  INR 1.3*   Cardiac Enzymes: No results for input(s): "CKTOTAL", "CKMB", "CKMBINDEX", "TROPONINI" in the last 168 hours. BNP (last 3  results) No results for input(s): "PROBNP" in the last 8760 hours. HbA1C: No results for input(s): "HGBA1C" in the last 72 hours. CBG: Recent Labs  Lab 06/18/23 1817 06/18/23 2036 06/18/23 2348 06/19/23 0436 06/19/23 0548  GLUCAP 265* 162* 95 69* 81   Lipid Profile: No results for input(s): "CHOL", "HDL", "LDLCALC", "TRIG", "CHOLHDL", "LDLDIRECT" in the last 72 hours. Thyroid Function Tests: Recent Labs    06/18/23 0325  TSH 6.258*   Anemia Panel: No results for input(s): "VITAMINB12", "FOLATE", "FERRITIN", "TIBC", "IRON", "RETICCTPCT" in the last 72 hours. Sepsis Labs: Recent Labs  Lab 06/14/23 0551 06/14/23 0931  LATICACIDVEN 3.0* 1.8    Recent Results (from the past 240 hours)  Blood culture (routine x 2)     Status: None   Collection Time: 06/14/23  5:40 AM   Specimen: BLOOD RIGHT HAND  Result Value Ref Range Status   Specimen Description BLOOD RIGHT HAND  Final   Special Requests   Final    BOTTLES DRAWN  AEROBIC AND ANAEROBIC Blood Culture adequate volume   Culture   Final    NO GROWTH 5 DAYS Performed at [facility], [address], [place], [place] [address]    Report Status 06/19/2023 FINAL  Final  Blood culture (routine x 2)     Status: None   Collection Time: 06/14/23  5:44 AM   Specimen: BLOOD RIGHT HAND  Result Value Ref Range Status   Specimen Description BLOOD RIGHT HAND  Final   Special Requests   Final    BOTTLES DRAWN AEROBIC AND ANAEROBIC Blood Culture results may not be optimal due to an inadequate volume of blood received in culture bottles   Culture   Final    NO GROWTH 5 DAYS Performed at [facility], [address] [place], [place] [address]    Report Status 06/19/2023 FINAL  Final  Resp panel by RT-PCR (RSV, Flu A&B, Covid) Anterior Nasal Swab     Status: None   Collection Time: 06/14/23  6:45 AM   Specimen: Anterior Nasal Swab  Result Value Ref Range Status   SARS Coronavirus 2 by RT PCR NEGATIVE NEGATIVE Final    Influenza A by PCR NEGATIVE NEGATIVE Final   Influenza B by PCR NEGATIVE NEGATIVE Final    Comment: (NOTE) The Xpert Xpress SARS-CoV-2/FLU/RSV plus assay is intended as an aid in the diagnosis of influenza from Nasopharyngeal swab specimens and should not be used as a sole basis for treatment. Nasal washings and aspirates are unacceptable for Xpert Xpress SARS-CoV-2/FLU/RSV testing.  Fact Sheet for Patients: BloggerCourse.com  Fact Sheet for Healthcare Providers: SeriousBroker.it  This test is not yet approved or cleared by the [place] FDA and has been authorized for detection and/or diagnosis of SARS-CoV-2 by FDA under an Emergency Use Authorization (EUA). This EUA will remain in effect (meaning this test can be used) for the duration of the COVID-19 declaration under Section 564(b)(1) of the Act, 21 U.S.C. section 360bbb-3(b)(1), unless the authorization is terminated or revoked.     Resp Syncytial Virus by PCR NEGATIVE NEGATIVE Final    Comment: (NOTE) Fact Sheet for Patients: BloggerCourse.com  Fact Sheet for Healthcare Providers: SeriousBroker.it  This test is not yet approved or cleared by the [place] FDA and has been authorized for detection and/or diagnosis of SARS-CoV-2 by FDA under an Emergency Use Authorization (EUA). This EUA will remain in effect (meaning this test can be used) for the duration of the COVID-19 declaration under Section 564(b)(1) of the Act, 21 U.S.C. section 360bbb-3(b)(1), unless the authorization is terminated or revoked.  Performed at [facility], [address], [place], [place] [address]   MRSA Next Gen by PCR, Nasal     Status: None   Collection Time: 06/14/23  2:15 PM   Specimen: Nasal Mucosa; Nasal Swab  Result Value Ref Range Status   MRSA by PCR Next Gen NOT DETECTED NOT DETECTED Final    Comment: (NOTE) The  GeneXpert MRSA Assay (FDA approved for NASAL specimens only), is one component of a comprehensive MRSA colonization surveillance program. It is not intended to diagnose MRSA infection nor to guide or monitor treatment for MRSA infections. Test performance is not FDA approved in patients less than 61 years old. Performed at [facility], [address], [place], [place] [address]          Radiology Studies: No results found.       Scheduled Meds:  amLODipine  5  mg Oral Daily   Chlorhexidine Gluconate Cloth  6 each Topical Daily   feeding supplement  237 mL Oral BID BM   folic acid  1 mg Oral Daily   heparin  5,000 Units Subcutaneous Q8H   irbesartan  75 mg Oral Daily   mouth rinse  15 mL Mouth Rinse 4 times per day   pantoprazole  40 mg Oral Q1200   polyethylene glycol  17 g Oral BID   senna-docusate  2 tablet Oral BID   Continuous Infusions:  dextrose 75 mL/hr at 06/18/23 1829   levETIRAcetam 500 mg (06/19/23 0326)          Glade Lloyd, MD [facility] 06/19/2023, 7:09 AM

## 2023-06-20 DIAGNOSIS — G40409 Other generalized epilepsy and epileptic syndromes, not intractable, without status epilepticus: Secondary | ICD-10-CM | POA: Diagnosis not present

## 2023-06-20 DIAGNOSIS — Z7189 Other specified counseling: Secondary | ICD-10-CM | POA: Diagnosis not present

## 2023-06-20 DIAGNOSIS — Z515 Encounter for palliative care: Secondary | ICD-10-CM | POA: Diagnosis not present

## 2023-06-20 LAB — CBC WITH DIFFERENTIAL/PLATELET
Abs Immature Granulocytes: 0.04 10*3/uL (ref 0.00–0.07)
Basophils Absolute: 0 10*3/uL (ref 0.0–0.1)
Basophils Relative: 0 %
Eosinophils Absolute: 0.1 10*3/uL (ref 0.0–0.5)
Eosinophils Relative: 1 %
HCT: 31.4 % — ABNORMAL LOW (ref 36.0–46.0)
Hemoglobin: 10.2 g/dL — ABNORMAL LOW (ref 12.0–15.0)
Immature Granulocytes: 1 %
Lymphocytes Relative: 23 %
Lymphs Abs: 1.6 10*3/uL (ref 0.7–4.0)
MCH: 32.7 pg (ref 26.0–34.0)
MCHC: 32.5 g/dL (ref 30.0–36.0)
MCV: 100.6 fL — ABNORMAL HIGH (ref 80.0–100.0)
Monocytes Absolute: 0.8 10*3/uL (ref 0.1–1.0)
Monocytes Relative: 10 %
Neutro Abs: 4.8 10*3/uL (ref 1.7–7.7)
Neutrophils Relative %: 65 %
Platelets: 116 10*3/uL — ABNORMAL LOW (ref 150–400)
RBC: 3.12 MIL/uL — ABNORMAL LOW (ref 3.87–5.11)
RDW: 15.5 % (ref 11.5–15.5)
WBC: 7.3 10*3/uL (ref 4.0–10.5)
nRBC: 0 % (ref 0.0–0.2)

## 2023-06-20 LAB — GLUCOSE, CAPILLARY
Glucose-Capillary: 112 mg/dL — ABNORMAL HIGH (ref 70–99)
Glucose-Capillary: 117 mg/dL — ABNORMAL HIGH (ref 70–99)
Glucose-Capillary: 123 mg/dL — ABNORMAL HIGH (ref 70–99)
Glucose-Capillary: 40 mg/dL — CL (ref 70–99)
Glucose-Capillary: 49 mg/dL — ABNORMAL LOW (ref 70–99)
Glucose-Capillary: 52 mg/dL — ABNORMAL LOW (ref 70–99)
Glucose-Capillary: 53 mg/dL — ABNORMAL LOW (ref 70–99)
Glucose-Capillary: 67 mg/dL — ABNORMAL LOW (ref 70–99)
Glucose-Capillary: 70 mg/dL (ref 70–99)
Glucose-Capillary: 76 mg/dL (ref 70–99)
Glucose-Capillary: 80 mg/dL (ref 70–99)

## 2023-06-20 LAB — HEPATIC FUNCTION PANEL
ALT: 46 U/L — ABNORMAL HIGH (ref 0–44)
AST: 45 U/L — ABNORMAL HIGH (ref 15–41)
Albumin: 2.5 g/dL — ABNORMAL LOW (ref 3.5–5.0)
Alkaline Phosphatase: 73 U/L (ref 38–126)
Bilirubin, Direct: 0.2 mg/dL (ref 0.0–0.2)
Indirect Bilirubin: 0.6 mg/dL (ref 0.3–0.9)
Total Bilirubin: 0.8 mg/dL (ref 0.0–1.2)
Total Protein: 4.8 g/dL — ABNORMAL LOW (ref 6.5–8.1)

## 2023-06-20 LAB — BASIC METABOLIC PANEL
Anion gap: 9 (ref 5–15)
BUN: 15 mg/dL (ref 8–23)
CO2: 26 mmol/L (ref 22–32)
Calcium: 9.5 mg/dL (ref 8.9–10.3)
Chloride: 102 mmol/L (ref 98–111)
Creatinine, Ser: 0.53 mg/dL (ref 0.44–1.00)
GFR, Estimated: >60 mL/min (ref >60)
Glucose, Bld: 93 mg/dL (ref 70–99)
Potassium: 4.7 mmol/L (ref 3.5–5.1)
Sodium: 137 mmol/L (ref 135–145)

## 2023-06-20 LAB — AMMONIA: Ammonia: 15 umol/L (ref 9–35)

## 2023-06-20 LAB — MAGNESIUM: Magnesium: 1.6 mg/dL — ABNORMAL LOW (ref 1.7–2.4)

## 2023-06-20 MED ORDER — DIVALPROEX SODIUM 125 MG PO CSDR
250.0000 mg | DELAYED_RELEASE_CAPSULE | Freq: Three times a day (TID) | ORAL | Status: DC
  Administered 2023-06-21 (×2): 250 mg via ORAL
  Filled 2023-06-20 (×4): qty 2

## 2023-06-20 MED ORDER — DEXTROSE 5 % IV SOLN
1000.0000 mg | Freq: Once | INTRAVENOUS | Status: AC
  Administered 2023-06-20: 1000 mg via INTRAVENOUS
  Filled 2023-06-20: qty 10

## 2023-06-20 MED ORDER — VALPROIC ACID 250 MG PO CAPS
250.0000 mg | ORAL_CAPSULE | Freq: Three times a day (TID) | ORAL | Status: DC
  Filled 2023-06-20: qty 1

## 2023-06-20 MED ORDER — MAGNESIUM SULFATE 2 GM/50ML IV SOLN
2.0000 g | Freq: Once | INTRAVENOUS | Status: AC
  Administered 2023-06-20: 2 g via INTRAVENOUS
  Filled 2023-06-20: qty 50

## 2023-06-20 MED ORDER — DEXTROSE 50 % IV SOLN
INTRAVENOUS | Status: AC
Start: 2023-06-20 — End: 2023-06-21
  Filled 2023-06-20: qty 50

## 2023-06-20 NOTE — Progress Notes (Signed)
 Olivia Ross daughter cell number is [phone] . Or [phone] Please call and update her on patient condition. Has concerns about  low blood sugars.

## 2023-06-20 NOTE — TOC Initial Note (Signed)
 Transition of Care (TOC) - Initial/Assessment Note  Donn Pierini RN, BSN Transitions of Care Unit 4E- RN Case Manager See Treatment Team for direct phone #   Patient Details  Name: Olivia Ross MRN: [id number] Date of Birth: [birth year]  Transition of Care [facility] [place]) CM/SW Contact:    Darrold Span, RN Phone Number: 06/20/2023, 12:17 PM  Clinical Narrative:                 Daughter returned call to discuss transition needs. HH/DME/PC.  Per daughter pt has primary care with [facility], with doctor that makes house calls. Daughter thinks that there may be a HH service attached as well- would like to check with Primary care regarding HH.  Choice offered for [facility] Per CMS guidelines from PhoneFinancing.pl website with star ratings (copy placed in shadow chart), Daughter states pt has used [facility] in past, and if [facility] not available through primary care then ok with using [facility] again. (Daugher to send PCP # to this CM to f/u) Choice also offered for PC follow up- daughter again states that she does not have a preference and will defer to this writer to secure on pt's behalf.   Discussed DME- hospital bed- no preference in provider- voiced bed does not need to be delivered prior to discharge.   Daughter voiced some concerns regarding unstable blood sugars and test results- would like to speak with MD- CM will send msg for MD to call daughter to discuss concerns.   Call made to [facility] liaison for outpt PC needs- referral accepted for outpt Palliative care follow up.   Call made to [facility] for DME- hospital bed- [facility] to follow up for Bed delivery w/ daughter.   Expected Discharge Plan: Home w Home Health Services Barriers to Discharge: Continued Medical Work up   Patient Goals and CMS Choice Patient states their goals for this hospitalization and ongoing recovery are:: return home w/ daughter CMS Medicare.gov Compare Post Acute Care list provided to:: Patient Represenative (must  comment) Choice offered to / list presented to : Adult Children      Expected Discharge Plan and Services   Discharge Planning Services: CM Consult Post Acute Care Choice: Durable Medical Equipment, Home Health Living arrangements for the past 2 months: Single Family Home                 DME Arranged: Hospital bed DME Agency: [facility] Date DME Agency Contacted: 06/20/23 Time DME Agency Contacted: 1216 Representative spoke with at DME Agency: Ian Malkin HH Arranged: PT, OT          Prior Living Arrangements/Services Living arrangements for the past 2 months: Single Family Home Lives with:: Adult Children Patient language and need for interpreter reviewed:: Yes Do you feel safe going back to the place where you live?: Yes      Need for Family Participation in Patient Care: Yes (Comment) Care giver support system in place?: Yes (comment) Current home services: DME Criminal Activity/Legal Involvement Pertinent to Current Situation/Hospitalization: No - Comment as needed  Activities of Daily Living   ADL Screening (condition at time of admission) Independently performs ADLs?: No Does the patient have a NEW difficulty with bathing/dressing/toileting/self-feeding that is expected to last >3 days?: Yes (Initiates electronic notice to provider for possible OT consult) Does the patient have a NEW difficulty with getting in/out of bed, walking, or climbing stairs that is expected to last >3 days?: Yes (Initiates electronic notice to provider for possible PT consult) Does the patient  have a NEW difficulty with communication that is expected to last >3 days?: Yes (Initiates electronic notice to provider for possible SLP consult) Is the patient deaf or have difficulty hearing?: Yes Does the patient have difficulty seeing, even when wearing glasses/contacts?: Yes Does the patient have difficulty concentrating, remembering, or making decisions?: Yes  Permission Sought/Granted Permission  sought to share information with : Facility Industrial/product designer granted to share information with : Yes, Verbal Permission Granted  Share Information with NAME: Nathan Stallworth  Permission granted to share info w AGENCY: HH/DME  Permission granted to share info w Relationship: daughter     Emotional Assessment Appearance:: Appears stated age Attitude/Demeanor/Rapport: Unable to Assess Affect (typically observed): Calm Orientation: : Oriented to Self Alcohol / Substance Use: Not Applicable Psych Involvement: No (comment)  Admission diagnosis:  Hypokalemia [E87.6] Seizure (HCC) [R56.9] Seizure disorder, 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) [G40.409] Patient Active Problem List   Diagnosis Date Noted   Seizure disorder, 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) 06/14/2023   Protein-calorie malnutrition, severe 02/04/2022   Acute CVA (cerebrovascular accident) (HCC) 02/03/2022   Dyslipidemia 02/03/2022   Transient alteration of awareness 08/26/2021   Sinus bradycardia 08/26/2021   Hypertension    History of CVA (cerebrovascular accident) 02/02/2021   Generalized weakness    Malignant neoplasm of upper-outer quadrant of right breast in female, estrogen receptor positive (HCC) 08/05/2019   Dementia without behavioral disturbance (HCC) 07/07/2019   PCP:  Stevphen Rochester, MD Pharmacy:   [facility] [phone] [place], [place] - [address] AT [facility] OF [address] [place] [address] Phone: [phone] Fax: [phone]     Social Drivers of Health (SDOH) Social History: SDOH Screenings   Food Insecurity: Patient Unable To Answer (06/14/2023)  Housing: Patient Unable To Answer (06/14/2023)  Transportation Needs: Patient Unable To Answer (06/14/2023)  Recent Concern: Transportation Needs - Unmet Transportation Needs (04/25/2023)   Received from [facility]  Utilities: Patient Unable To Answer (06/14/2023)  Financial Resource Strain: Low Risk  (04/25/2023)   Received  from [facility]  Physical Activity: Unknown (04/25/2023)   Received from [facility]  Social Connections: Patient Unable To Answer (06/14/2023)  Stress: No Stress Concern Present (04/25/2023)   Received from [facility]  Tobacco Use: Medium Risk (06/14/2023)   SDOH Interventions:     Readmission Risk Interventions     No data to display

## 2023-06-20 NOTE — Progress Notes (Signed)
 Speech Language Pathology Treatment: Dysphagia  Patient Details Name: Olivia Ross MRN: [id number] DOB: 1946/03/04 Today's Date: 06/20/2023 Time: 7253-6644 SLP Time Calculation (min) (ACUTE ONLY): 13 min  Assessment / Plan / Recommendation Clinical Impression  Pt's daughter reports diet PTA included very soft solids, pureed or chopped. She states pt's presentation of oral holding is an acute change. Oral holding appears improved, but not resolved compared to previous sessions. She had increased throat clearance this date following sips of thin liquids, although question impact of suboptimal positioning in bed this date. Recommend continuing current diet. Education was provided to pt and her daughter regarding cognitive effects on swallowing. SLP will f/u at least briefly to assess ability to advance diet pending improvement in mentation.     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  Family thought she may have UTI.  Patient initially alert then had a seizure en route to hospital.  EMS gave 5mg  IM Versed, but then she became apneic and she was intubated (Etomidate and Succinylcholine) in the ED.  She was diagnosed with hypoxic respiratory failure requiring intubation, seizure disorder, lactic acidosis and hypokalemia.  Most recent CT of the head was showing no acute findings with multiple chronic infarcts.      SLP Plan  Continue with current plan of care      Recommendations for follow up therapy are one component of a multi-disciplinary discharge planning process, led by the attending physician.  Recommendations may be updated based on patient status, additional functional criteria and insurance authorization.    Recommendations  Diet recommendations: Dysphagia 1 (puree);Thin liquid Liquids provided via: Cup;Straw Medication Administration: Crushed with  puree Supervision: Full supervision/cueing for compensatory strategies Compensations: Slow rate;Small sips/bites Postural Changes and/or Swallow Maneuvers: Seated upright 90 degrees                  Oral care BID   Frequent or constant Supervision/Assistance Dysphagia, unspecified (R13.10)     Continue with current plan of care     Gwynneth Aliment, M.A., CF-SLP Speech Language Pathology, Acute Rehabilitation Services  Secure Chat preferred [phone]   06/20/2023, 5:10 PM

## 2023-06-20 NOTE — Progress Notes (Signed)
 Physical Therapy Treatment Patient Details Name: Olivia Ross MRN: [id number] DOB: 02-Nov-1945 Today's Date: 06/20/2023   History of Present Illness 78 y.o. female presents to ED 06/14/23 for Code Stroke, left gaze and aphasic, questionable seizure. EEG showing potential moderate diffuse encephalopathy with no apparent seizure activity.  Intubated <24 hours  PMH: dementia, HTN, right breast cancer, R MCA CVA, L MCA CVA    PT Comments  Pt sitting up in long sitting in bed on arrival, awake and alert throughout session, however limited by weakness and poor cognition with pt able to follow limited commands throughout session. Pt requiring total A to come to sitting EOB with pt demonstrating poor initiation. Pt able to maintain seated balance without UE support ~ before fatigue and R LOB with poor/zero righting reaction. Pt needing total A to return to supine at end of session. Pt continues to benefit from skilled PT services to progress toward functional mobility goals.     If plan is discharge home, recommend the following: A lot of help with walking and/or transfers;A lot of help with bathing/dressing/bathroom;Assistance with cooking/housework;Assist for transportation;Help with stairs or ramp for entrance;Supervision due to cognitive status   Can travel by private vehicle        Equipment Recommendations  Hospital bed    Recommendations for Other Services       Precautions / Restrictions Precautions Precautions: Fall Precaution/Restrictions Comments: multiple bony prominences--currently covered wtih Mepilix Restrictions Weight Bearing Restrictions Per Provider Order: No     Mobility  Bed Mobility Overal bed mobility: Needs Assistance Bed Mobility: Sit to Supine       Sit to supine: Total assist   General bed mobility comments: pt long sitting in bed on arrival and requiring total A to turn to EOB, total A to return to supine    Transfers Overall transfer level: Needs  assistance                 General transfer comment: not attempted    Ambulation/Gait               General Gait Details: pt does not ambulate   Stairs             Wheelchair Mobility     Tilt Bed    Modified Rankin (Stroke Patients Only)       Balance Overall balance assessment: Needs assistance Sitting-balance support: Feet supported, No upper extremity supported Sitting balance-Leahy Scale: Poor Sitting balance - Comments: able to maintiain statically without assist, R lean and LOB with fatigue pt keeping bil hands on lap                                    Communication Communication Communication:  (dementia)  Cognition Arousal: Alert Behavior During Therapy: Flat affect   PT - Cognitive impairments: History of cognitive impairments                       PT - Cognition Comments: follows about 50% of commands Following commands: Impaired Following commands impaired: Follows one step commands inconsistently, Follows one step commands with increased time    Cueing Cueing Techniques: Verbal cues, Gestural cues, Visual cues  Exercises Other Exercises Other Exercises: attempting seated exercises, pt unable to follow commands to complete Other Exercises: seated trunk extension    General Comments General comments (skin integrity, edema, etc.): palm guard in place  on R      Pertinent Vitals/Pain Pain Assessment Pain Assessment: Faces Faces Pain Scale: Hurts a little bit Pain Location: generalized with bed mobility Pain Descriptors / Indicators: Moaning Pain Intervention(s): Monitored during session, Limited activity within patient's tolerance    Home Living                          Prior Function            PT Goals (current goals can now be found in the care plan section) Acute Rehab PT Goals Patient Stated Goal: none stated PT Goal Formulation: Patient unable to participate in goal setting Time  For Goal Achievement: 07/01/23 Progress towards PT goals: Not progressing toward goals - comment (mentaion)    Frequency    Min 1X/week      PT Plan      Co-evaluation              AM-PAC PT "6 Clicks" Mobility   Outcome Measure  Help needed turning from your back to your side while in a flat bed without using bedrails?: Total Help needed moving from lying on your back to sitting on the side of a flat bed without using bedrails?: Total Help needed moving to and from a bed to a chair (including a wheelchair)?: Total Help needed standing up from a chair using your arms (e.g., wheelchair or bedside chair)?: Total Help needed to walk in hospital room?: Total Help needed climbing 3-5 steps with a railing? : Total 6 Click Score: 6    End of Session   Activity Tolerance: Patient tolerated treatment well Patient left: with call bell/phone within reach;in bed;with bed alarm set Nurse Communication: Mobility status PT Visit Diagnosis: Muscle weakness (generalized) (M62.81)     Time: 1610-9604 PT Time Calculation (min) (ACUTE ONLY): 13 min  Charges:    $Therapeutic Activity: 8-22 mins PT General Charges $$ ACUTE PT VISIT: 1 Visit                     Tobi Bastos R. PTA Acute Rehabilitation Services Office: [phone]   Catalina Antigua 06/20/2023, 4:01 PM

## 2023-06-20 NOTE — Progress Notes (Addendum)
 PROGRESS NOTE    Olivia Ross  BJY:[id number] DOB: [birth year] DOA: 06/14/2023 PCP: Stevphen Rochester, MD   Brief Narrative:  78 y/o female with PMH Dementia, Right MCA stroke 2022, Lt MCA Stroke 11/23, HTN, HLD was brought in by EMS for code stroke with left gaze deviation, aphasia and?  History of seizures.  Patient initially alert but had a seizure and out of the hospital.  She subsequently required intubation in the ED and admission to ICU.  Neurology was consulted.  She was started on Keppra.  She was subsequently extubated.  LTM EEG negative for seizures.  Palliative care consulted for goals of care discussion.  MRI brain showed no acute stroke but showed old strokes.  Neurology signed off on 06/16/2023 and recommended outpatient follow-up with neurology.  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  She was transferred to [facility] service from 06/16/2023 onwards.  Assessment & Plan:   Seizure -Symptoms possibly due to seizure activity. -Neurology evaluation and follow-up appreciated.  LTM EEG negative and MRI of brain did not show any acute findings. -Continue Keppra.  Neurology signed off on 06/16/2023.  Outpatient follow-up with neurology. 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   -MRI of abdomen showed a 2.2 x 2.1 cm lesion in the pancreatic head raising concern for neoplasm. -Prior hospitalist discussed these findings with patient's daughter: Because of patient's dementia and other comorbidities, patient is not a candidate for any treatments and may not truly benefit from procedures such as biopsy.  Daughter is also contemplating the same.  Goals of care -Palliative care evaluation appreciated.  Currently remains full code. -Daughter was strongly urged to consider changing CODE STATUS by  prior hospitalist: Daughter will get back to [place] on that -Overall prognosis is guarded to poor. -Patient has had persistent hypoglycemia despite D10 drip with not much oral intake and improvement in clinical condition.  I recommend that family consider home/residential hospice and comfort measures.  Hypoglycemia -From poor oral intake.  -Continues to have hypoglycemic episodes despite being on D10 drip  Constipation -Continue bowel regimen  History of vascular dementia -Fall precautions.  PT/OT recommending home health PT/OT  Anemia of chronic disease -From chronic illnesses.  Hemoglobin currently stable.  Monitor intermittently  Thrombocytopenia -No signs of bleeding.  Monitor intermittently  Oropharyngeal dysphagia -Diet as per SLP recommendations   DVT prophylaxis: Subcutaneous heparin Code Status: Full Family Communication: Spoke to daughter on phone disposition Plan: Status is: Inpatient Remains inpatient appropriate because: Of severity of illness   Consultants: Neurology/PCCM/palliative care  Procedures: As above  Antimicrobials: None   Subjective: Patient seen and examined at bedside.  Poor historian.  No agitation, seizures, vomiting reported. Objective: Vitals:   06/19/23 2200 06/20/23 0014 06/20/23 0015 06/20/23 0445  BP:  (!) 153/53 (!) 153/53 (!) 149/90  Pulse: 76 77 93 91  Resp: 13 14  12   Temp:  (!) 97.5 F (36.4 C)  98.1 F (36.7 C)  TempSrc:  Oral  Oral  SpO2: 100% 100% 98% 100%  Weight:      Height:        Intake/Output Summary (Last 24 hours) at 06/20/2023 0731 Last data filed at 06/20/2023 0600 Gross per 24 hour  Intake 2584.61 ml  Output 2400 ml  Net 184.61 ml   Ceasar Mons  Weights   06/17/23 0418 06/18/23 0422 06/19/23 0241  Weight: 39.6 kg 39.5 kg 43 kg    Examination:  General: No acute distress.  Remains on room air.  Chronically ill and deconditioned looking.  Extremely thinly built. ENT/neck: No neck masses or JVD elevation  noted respiratory: Bilateral decreased breath sounds at bases with scattered crackles CVS: Rate mostly controlled; S1 and S2 are heard  abdominal: Soft, nontender, distended mildly; no organomegaly, bowel sounds are heard normally Extremities: No clubbing; mild lower extremity edema present CNS: Extremely slow to respond and a very poor historian.  No obvious focal deficits noted.   Lymph: No palpable lymphadenopathy Skin: No obvious petechiae/rashes psych: No signs of agitation currently.  Flat affect mostly.  Musculoskeletal: No obvious joint erythema/tenderness     Data Reviewed: I have personally reviewed following labs and imaging studies  CBC: Recent Labs  Lab 06/14/23 0423 06/14/23 0529 06/15/23 0232 06/16/23 0417 06/17/23 0437 06/18/23 0325 06/20/23 0405  WBC 4.1  --  7.7 4.9 5.7 5.3 7.3  NEUTROABS 3.0  --   --   --   --   --  4.8  HGB 9.8*   < > 10.7* 11.1* 10.8* 11.3* 10.2*  HCT 28.4*   < > 31.9* 33.7* 32.8* 33.5* 31.4*  MCV 95.9  --  96.7 98.0 98.2 98.0 100.6*  PLT 97*  --  120* 104* 113* 136* 116*   < > = values in this interval not displayed.   Basic Metabolic Panel: Recent Labs  Lab 06/15/23 0232 06/16/23 0417 06/17/23 0437 06/18/23 0325 06/19/23 0328 06/20/23 0405  NA 143 139 138 136 135 137  K 4.4 3.0* 3.1* 3.9 4.2 4.7  CL 108 104 104 103 101 102  CO2 30 26 27 27 27 26   GLUCOSE 92 109* 69* 102* 69* 93  BUN 8 8 7* 12 14 15   CREATININE 0.49 0.53 0.56 0.60 0.58 0.53  CALCIUM 9.3 8.9 8.8* 9.2 9.0 9.5  MG 1.8 1.9 1.6* 2.2 1.8 1.6*  PHOS 1.7* 2.6 1.2* 1.3*  --   --    GFR: Estimated Creatinine Clearance: 38.7 mL/min (by C-G formula based on SCr of 0.53 mg/dL). Liver Function Tests: Recent Labs  Lab 06/14/23 0423 06/15/23 0232 06/16/23 0417 06/17/23 0437 06/18/23 0325  AST 33 31 28 29  46*  ALT 22 25 23 26  41  ALKPHOS 66 64 58 70 98  BILITOT 0.4 0.6 0.7 0.5 0.4  PROT 3.8* 4.9* 5.3* 5.0* 5.0*  ALBUMIN 1.9* 2.7* 2.8* 2.6* 2.6*   No results  for input(s): "LIPASE", "AMYLASE" in the last 168 hours. No results for input(s): "AMMONIA" in the last 168 hours. Coagulation Profile: Recent Labs  Lab 06/14/23 0423  INR 1.3*   Cardiac Enzymes: No results for input(s): "CKTOTAL", "CKMB", "CKMBINDEX", "TROPONINI" in the last 168 hours. BNP (last 3 results) No results for input(s): "PROBNP" in the last 8760 hours. HbA1C: No results for input(s): "HGBA1C" in the last 72 hours. CBG: Recent Labs  Lab 06/19/23 1643 06/19/23 2116 06/20/23 0018 06/20/23 0443 06/20/23 0539  GLUCAP 78 91 80 53* 70   Lipid Profile: No results for input(s): "CHOL", "HDL", "LDLCALC", "TRIG", "CHOLHDL", "LDLDIRECT" in the last 72 hours. Thyroid Function Tests: Recent Labs    06/18/23 0325  TSH 6.258*   Anemia Panel: No results for input(s): "VITAMINB12", "FOLATE", "FERRITIN", "TIBC", "IRON", "RETICCTPCT" in the last 72 hours. Sepsis Labs: Recent Labs  Lab 06/14/23 0551 06/14/23 0931  LATICACIDVEN 3.0* 1.8  Recent Results (from the past 240 hours)  Blood culture (routine x 2)     Status: None   Collection Time: 06/14/23  5:40 AM   Specimen: BLOOD RIGHT HAND  Result Value Ref Range Status   Specimen Description BLOOD RIGHT HAND  Final   Special Requests   Final    BOTTLES DRAWN AEROBIC AND ANAEROBIC Blood Culture adequate volume   Culture   Final    NO GROWTH 5 DAYS Performed at [facility], [address], [place], [place] [address]    Report Status 06/19/2023 FINAL  Final  Blood culture (routine x 2)     Status: None   Collection Time: 06/14/23  5:44 AM   Specimen: BLOOD RIGHT HAND  Result Value Ref Range Status   Specimen Description BLOOD RIGHT HAND  Final   Special Requests   Final    BOTTLES DRAWN AEROBIC AND ANAEROBIC Blood Culture results may not be optimal due to an inadequate volume of blood received in culture bottles   Culture   Final    NO GROWTH 5 DAYS Performed at [facility], [address],  [place], [place] [address]    Report Status 06/19/2023 FINAL  Final  Resp panel by RT-PCR (RSV, Flu A&B, Covid) Anterior Nasal Swab     Status: None   Collection Time: 06/14/23  6:45 AM   Specimen: Anterior Nasal Swab  Result Value Ref Range Status   SARS Coronavirus 2 by RT PCR NEGATIVE NEGATIVE Final   Influenza A by PCR NEGATIVE NEGATIVE Final   Influenza B by PCR NEGATIVE NEGATIVE Final    Comment: (NOTE) The Xpert Xpress SARS-CoV-2/FLU/RSV plus assay is intended as an aid in the diagnosis of influenza from Nasopharyngeal swab specimens and should not be used as a sole basis for treatment. Nasal washings and aspirates are unacceptable for Xpert Xpress SARS-CoV-2/FLU/RSV testing.  Fact Sheet for Patients: BloggerCourse.com  Fact Sheet for Healthcare Providers: SeriousBroker.it  This test is not yet approved or cleared by the [place] FDA and has been authorized for detection and/or diagnosis of SARS-CoV-2 by FDA under an Emergency Use Authorization (EUA). This EUA will remain in effect (meaning this test can be used) for the duration of the COVID-19 declaration under Section 564(b)(1) of the Act, 21 U.S.C. section 360bbb-3(b)(1), unless the authorization is terminated or revoked.     Resp Syncytial Virus by PCR NEGATIVE NEGATIVE Final    Comment: (NOTE) Fact Sheet for Patients: BloggerCourse.com  Fact Sheet for Healthcare Providers: SeriousBroker.it  This test is not yet approved or cleared by the [place] FDA and has been authorized for detection and/or diagnosis of SARS-CoV-2 by FDA under an Emergency Use Authorization (EUA). This EUA will remain in effect (meaning this test can be used) for the duration of the COVID-19 declaration under Section 564(b)(1) of the Act, 21 U.S.C. section 360bbb-3(b)(1), unless the authorization is terminated or revoked.  Performed at  [facility], [address] [place], [place] [address]   MRSA Next Gen by PCR, Nasal     Status: None   Collection Time: 06/14/23  2:15 PM   Specimen: Nasal Mucosa; Nasal Swab  Result Value Ref Range Status   MRSA by PCR Next Gen NOT DETECTED NOT DETECTED Final    Comment: (NOTE) The GeneXpert MRSA Assay (FDA approved for NASAL specimens only), is one component of a comprehensive MRSA colonization surveillance program. It is not intended to diagnose MRSA infection nor  to guide or monitor treatment for MRSA infections. Test performance is not FDA approved in patients less than 29 years old. Performed at [facility], [address] [place], [place] [address]          Radiology Studies: No results found.       Scheduled Meds:  amLODipine  5 mg Oral Daily   Chlorhexidine Gluconate Cloth  6 each Topical Daily   feeding supplement  237 mL Oral BID BM   folic acid  1 mg Oral Daily   heparin  5,000 Units Subcutaneous Q8H   irbesartan  75 mg Oral Daily   mouth rinse  15 mL Mouth Rinse 4 times per day   pantoprazole  40 mg Oral Q1200   polyethylene glycol  17 g Oral BID   senna-docusate  2 tablet Oral BID   Continuous Infusions:  dextrose 100 mL/hr at 06/20/23 0504   levETIRAcetam 500 mg (06/20/23 0422)          Glade Lloyd, MD [facility] 06/20/2023, 7:31 AM

## 2023-06-20 NOTE — Progress Notes (Signed)
   [facility] Liaison Note:  Notified by [facility] manager of patient/family request for [facility] Palliative services at home after discharge.   Please call with any hospice or outpatient palliative care related questions.   Thank you for the opportunity to participate in this patient's care.   Glenna Fellows, BSN, RN, OCN [employer] [phone]

## 2023-06-20 NOTE — Progress Notes (Signed)
 CBG 49. Patient is alert and skin is warm and dry. No signs of low blood sugar. Patient had eaten all of her meal. Discuss with Tim RN charge nurse gave one Glucose Gel by mouth. Repeat CBG 117 . Dr. Arlean Hopping called and made aware. Increase D 10% to 125 ml/hr and will get new lab. Also aware that daughter wants to talk with M.D. about low blood sugars.

## 2023-06-20 NOTE — TOC CM/SW Note (Signed)
   Durable Medical Equipment (From admission, onward)        Start     Ordered  06/20/23 1154  For home use only DME Hospital bed  Once      Question Answer Comment Length of Need 6 Months  Patient has (list medical condition): hx stroke  Head must be elevated greater than: 30 degrees  Bed type Semi-electric  Support Surface: Gel Overlay    06/20/23 1154

## 2023-06-20 NOTE — Progress Notes (Signed)
 [facility] night cross cover note:   I was notified by RN that the patient's blood sugar remains low in spite of the patient's good oral intake as well as D10 running at 100 cc/h as well as prn amps of D50.  Additionally, patient's daughter expresses curiosity as to if the patient's pancreatic mass is resulting in an increase in endogenous insulin production resulting in the patient's hypoglycemia.  To evaluate this, I subsequently ordered C-peptide level.  In the meantime, I have increased the rate of her D10 from 100 cc/h to 125 cc/h.       Newton Pigg, DO Hospitalist

## 2023-06-20 NOTE — Plan of Care (Signed)
 Hospitalist has communicated to Neurology team that he had a long conversation with the daughter on phone who is very concerned that Keppra might be causing her mother to be very sedated and is hoping that either the dose be changed or she be switched to something else to see if that might help with mental status.  Based on my review of her history and labs, we can switch her to valproic acid. I have put in the order for a 1000 mg IV load followed by scheduled dosing of 250 mg po TID. A VPA trough level is scheduled for tomorrow at 11 AM. If VPA level is therapeutic we can then decrease Keppra to 250 mg BID for 2 days, then stop.   Electronically signed: Dr. Caryl Pina

## 2023-06-20 NOTE — Progress Notes (Signed)
 Hypoglycemic Event  CBG: 53  Treatment: 4 oz juice/soda  Symptoms: None  Follow-up CBG: Time:539 CBG Result:70  Possible Reasons for Event: Inadequate meal intake  Comments/MD notified: Pt currently on D10 infusion    [name], Olivia Ross R

## 2023-06-20 NOTE — Care Management Important Message (Signed)
 Important Message  Patient Details  Name: Olivia Ross MRN: [id number] Date of Birth: Mar 25, 1946   Important Message Given:  Yes - Medicare IM     Renie Ora 06/20/2023, 10:16 AM

## 2023-06-20 NOTE — Progress Notes (Signed)
 Daily Progress Note   Patient Name: Olivia Ross       Date: 06/20/2023 DOB: [birth year]  Age: 78 y.o. MRN#: [id number] Attending Physician: Glade Lloyd, MD Primary Care Physician: Stevphen Rochester, MD Admit Date: 06/14/2023  Reason for Consultation/Follow-up: Establishing goals of care  Subjective: Medical records reviewed including progress notes, labs, imaging. Patient assessed at the bedside.  She is in fetal position with no visitors present.  Did not disturb in order to preserve as much comfort as possible.  Called patient's daughter Olivia Ross for ongoing goals of care conversation.  Reviewed interval history since our last conversation.  She remains concerned about oversedation and high doses of Keppra.  She has discussed this with neurology.  Her goal is for patient to have a balance between "managing seizures in the least restrictive way (sedation wise) while maintaining quality of life with much as possible.  She would like to discuss the treatment plan further with the patient's attending.  Shared that I would pass along, as well as asked nurse to pass along if they are unable to get in touch.  Olivia Ross plans to be here in person late this afternoon.  She also understands that hypoglycemia has been an ongoing issue.  At her request we reviewed glucose readings over the past 48 hours.  We reviewed the percentage of her meals eaten.  She is initially concerned the patient may have diabetes, as she was prediabetic.  I shared that this is unlikely, as her A1c is 4.7 as of 3/15.  I discussed my concern that this may be a result of her neurological processes, overall decline, malignancy, with little to no options moving forward.  She did not feel that the malignancy was ruled in entirely and I provided her with update on CA 19-9 results.   She had concern that CT did not even have contrast and not prove provided update on follow-up MRI results.  Olivia Ross agrees that this is concerning and wonders if patient could be evaluated by Smitty Cords, who cared for patient when she had breast cancer.  Ultimately, patient's daughter feels that patient's team has been "jumping to the worst case scenario." While she understands rationale for this and the doctors' responsibility, she would like to continue workup and more open discussion of treatable processes.  Emotional support therapeutic listening was provided.  I encouraged her to continue reflecting on goals of care and inform her that I would be on service all weekend for additional support.  Questions and concerns addressed. PMT will continue to support holistically.   Length of Stay: 6   Physical Exam Vitals  and nursing note reviewed.  Constitutional:      Appearance: She is ill-appearing.  Cardiovascular:     Rate and Rhythm: Normal rate.  Pulmonary:     Effort: Pulmonary effort is normal.  Neurological:     Mental Status: She is disoriented and confused.  Psychiatric:        Cognition and Memory: Cognition is impaired.            Vital Signs: BP 113/60 (BP Location: Left Arm)   Pulse 88   Temp 99 F (37.2 C) (Oral)   Resp 16   Ht 5\' 3"  (1.6 m)   Wt 43 kg   SpO2 100%   BMI 16.79 kg/m  SpO2: SpO2: 100 % O2 Device: O2 Device: Room Air O2 Flow Rate: O2 Flow Rate (L/min): 1 L/min      Palliative Assessment/Data: 20-30%   Palliative Care Assessment & Plan   Patient Profile: 78 y.o. 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  She also  had a seizure en route to the hospital and was treated accordingly. PMT has been consulted to assist with goals of care conversation.  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  Her priority at this time is to continue with workup and treatment of reversible causes of symptoms/hypoglycemia Goal is to balance management of seizures with minimal sedation and maximizing quality of life Psychosocial and emotional support provided Ongoing goals of care discussions PMT to continue to follow and support   Prognosis:  Unable to determine  Discharge Planning: To Be Determined  Care plan was discussed with Patient's daughter, RN, MD          Rico Ala, PA-C  Palliative Medicine Team Team phone # [phone]  Thank you for allowing the Palliative Medicine Team to assist in the care of this patient. Please utilize secure chat with additional questions, if there is no response within 30 minutes please call the above phone number.  Palliative Medicine Team providers are available by phone from 7am to 7pm daily and can be reached through the team cell phone.  Should this patient require assistance outside of these hours, please call the patient's attending physician.

## 2023-06-21 DIAGNOSIS — G40409 Other generalized epilepsy and epileptic syndromes, not intractable, without status epilepticus: Secondary | ICD-10-CM | POA: Diagnosis not present

## 2023-06-21 LAB — GLUCOSE, CAPILLARY
Glucose-Capillary: 120 mg/dL — ABNORMAL HIGH (ref 70–99)
Glucose-Capillary: 99 mg/dL (ref 70–99)
Glucose-Capillary: 100 mg/dL — ABNORMAL HIGH (ref 70–99)
Glucose-Capillary: 102 mg/dL — ABNORMAL HIGH (ref 70–99)
Glucose-Capillary: 85 mg/dL (ref 70–99)

## 2023-06-21 LAB — CBC WITH DIFFERENTIAL/PLATELET
Abs Immature Granulocytes: 0.04 10*3/uL (ref 0.00–0.07)
Basophils Absolute: 0 10*3/uL (ref 0.0–0.1)
Basophils Relative: 0 %
Eosinophils Absolute: 0 10*3/uL (ref 0.0–0.5)
Eosinophils Relative: 1 %
HCT: 24.6 % — ABNORMAL LOW (ref 36.0–46.0)
Hemoglobin: 8.1 g/dL — ABNORMAL LOW (ref 12.0–15.0)
Immature Granulocytes: 1 %
Lymphocytes Relative: 25 %
Lymphs Abs: 1.5 10*3/uL (ref 0.7–4.0)
MCH: 33.3 pg (ref 26.0–34.0)
MCHC: 32.9 g/dL (ref 30.0–36.0)
MCV: 101.2 fL — ABNORMAL HIGH (ref 80.0–100.0)
Monocytes Absolute: 0.6 10*3/uL (ref 0.1–1.0)
Monocytes Relative: 9 %
Neutro Abs: 3.9 10*3/uL (ref 1.7–7.7)
Neutrophils Relative %: 64 %
Platelets: 119 10*3/uL — ABNORMAL LOW (ref 150–400)
RBC: 2.43 MIL/uL — ABNORMAL LOW (ref 3.87–5.11)
RDW: 15.3 % (ref 11.5–15.5)
WBC: 6 10*3/uL (ref 4.0–10.5)
nRBC: 0 % (ref 0.0–0.2)

## 2023-06-21 LAB — MAGNESIUM: Magnesium: 1.7 mg/dL (ref 1.7–2.4)

## 2023-06-21 LAB — COMPREHENSIVE METABOLIC PANEL
ALT: 59 U/L — ABNORMAL HIGH (ref 0–44)
AST: 62 U/L — ABNORMAL HIGH (ref 15–41)
Albumin: 2.2 g/dL — ABNORMAL LOW (ref 3.5–5.0)
Alkaline Phosphatase: 80 U/L (ref 38–126)
Anion gap: 6 (ref 5–15)
BUN: 15 mg/dL (ref 8–23)
CO2: 25 mmol/L (ref 22–32)
Calcium: 9.2 mg/dL (ref 8.9–10.3)
Chloride: 106 mmol/L (ref 98–111)
Creatinine, Ser: 0.68 mg/dL (ref 0.44–1.00)
GFR, Estimated: >60 mL/min (ref >60)
Glucose, Bld: 126 mg/dL — ABNORMAL HIGH (ref 70–99)
Potassium: 3.8 mmol/L (ref 3.5–5.1)
Sodium: 137 mmol/L (ref 135–145)
Total Bilirubin: 0.6 mg/dL (ref 0.0–1.2)
Total Protein: 4.5 g/dL — ABNORMAL LOW (ref 6.5–8.1)

## 2023-06-21 LAB — VALPROIC ACID LEVEL: Valproic Acid Lvl: 47 ug/mL — ABNORMAL LOW (ref 50.0–100.0)

## 2023-06-21 MED ORDER — SODIUM CHLORIDE 0.9 % IV SOLN
250.0000 mg | Freq: Two times a day (BID) | INTRAVENOUS | Status: AC
Start: 2023-06-21 — End: 2023-06-23
  Administered 2023-06-21 – 2023-06-23 (×4): 250 mg via INTRAVENOUS
  Filled 2023-06-21 (×4): qty 2.5

## 2023-06-21 MED ORDER — DIVALPROEX SODIUM 125 MG PO CSDR
250.0000 mg | DELAYED_RELEASE_CAPSULE | Freq: Four times a day (QID) | ORAL | Status: DC
  Administered 2023-06-21 – 2023-06-22 (×3): 250 mg via ORAL
  Administered 2023-06-22: 125 mg via ORAL
  Administered 2023-06-22 – 2023-07-01 (×34): 250 mg via ORAL
  Filled 2023-06-21 (×43): qty 2

## 2023-06-21 MED ORDER — SIMETHICONE 40 MG/0.6ML PO SUSP
80.0000 mg | Freq: Four times a day (QID) | ORAL | Status: DC | PRN
  Administered 2023-06-23: 80 mg via ORAL
  Filled 2023-06-21 (×2): qty 1.2

## 2023-06-21 NOTE — Progress Notes (Addendum)
 NEUROLOGY CONSULT FOLLOW UP NOTE   Date of service: June 21, 2023 Patient Name: Olivia Ross MRN:  [id number] DOB:  Aug 29, 1945  Primary team has requested reevaluation on behalf of the daughter for consideration of changing or reducing antiseizure medications  Interval Hx/subjective  Seen in room with no family at the bedside. Awake, alert.  Denies pain, endorses hunger  Vitals   Vitals:   06/21/23 0009 06/21/23 0308 06/21/23 0324 06/21/23 0514  BP: (!) 173/106  (!) 142/75 130/67  Pulse: 93 (!) 111 (!) 119 (!) 116  Resp: 20 13 14 16   Temp: 98 F (36.7 C)  99.4 F (37.4 C) 99.9 F (37.7 C)  TempSrc: Oral  Oral Oral  SpO2: 100% 100% 100% 100%  Weight:  45.5 kg    Height:         Body mass index is 17.77 kg/m.  Physical Exam   Constitutional:  appears frail, old and cachectic  Eyes: No scleral injection.  Cardiovascular: Normal rate and regular rhythm.  Respiratory: Effort normal, non-labored breathing.  Skin: WDI.   Mental Status: Patient is awake, alert, able to tell me her name and follow simple one step commands with cues.  She is able to drink the remainder of her Ensure from her cup at bedside with examiner assisting to hold the cup and straw.  She accepts a spoonful of applesauce but then continues to masticate it taking a long time to swallow.  She reports she does not like the taste of applesauce.  Cranial Nerves: II: Blinks to threat bilaterally. Pupils are equal, round, and reactive to light.   III,IV, VI: Control and instrumentation engineer  VII: Facial movement is symmetric resting and smiling VIII: Hearing is intact to voice Motor: Increased tone with decreased bulk.  Right upper extremity is contracted.  Left upper extremity moves freely antigravity Sensory: Grossly equally reactive to touch in all 4 extremities Cerebellar: Able to give examiner high-five with the left upper extremity with some mild discoordination felt to be proportional to her muscle  wasting   Medications  Current Facility-Administered Medications:    amLODipine (NORVASC) tablet 5 mg, 5 mg, Oral, Daily, Osvaldo Shipper, MD, 5 mg at 06/21/23 4098   Chlorhexidine Gluconate Cloth 2 % PADS 6 each, 6 each, Topical, Daily, Lorin Glass, MD, 6 each at 06/20/23 0843   dextrose 10 % infusion, , Intravenous, Continuous, Howerter, Justin B, DO, Last Rate: 125 mL/hr at 06/21/23 0021, Started During Downtime at 06/21/23 0021   divalproex (DEPAKOTE SPRINKLE) capsule 250 mg, 250 mg, Oral, TID, Alekh, Kshitiz, MD, 250 mg at 06/21/23 0842   feeding supplement (ENSURE ENLIVE / ENSURE PLUS) liquid 237 mL, 237 mL, Oral, BID BM, Osvaldo Shipper, MD, 237 mL at 06/21/23 1191   folic acid (FOLVITE) tablet 1 mg, 1 mg, Oral, Daily, Osvaldo Shipper, MD, 1 mg at 06/21/23 0834   heparin injection 5,000 Units, 5,000 Units, Subcutaneous, Q8H, Rozann Lesches, MD, 5,000 Units at 06/21/23 0506   irbesartan (AVAPRO) tablet 75 mg, 75 mg, Oral, Daily, Osvaldo Shipper, MD, 75 mg at 06/21/23 4782   labetalol (NORMODYNE) injection 10 mg, 10 mg, Intravenous, Q2H PRN, Lorin Glass, MD   levETIRAcetam (KEPPRA) IVPB 500 mg/100 mL premix, 500 mg, Intravenous, BID, Erick Blinks, MD, Stopped at 06/21/23 0349   Oral care mouth rinse, 15 mL, Mouth Rinse, 4 times per day, Lorin Glass, MD, 15 mL at 06/21/23 9562   Oral care mouth rinse, 15 mL, Mouth Rinse, PRN, Levon Hedger  C, MD   pantoprazole (PROTONIX) EC tablet 40 mg, 40 mg, Oral, Q1200, Osvaldo Shipper, MD, 40 mg at 06/20/23 1234   polyethylene glycol (MIRALAX / GLYCOLAX) packet 17 g, 17 g, Oral, BID, Osvaldo Shipper, MD, 17 g at 06/21/23 8295   senna-docusate (Senokot-S) tablet 2 tablet, 2 tablet, Oral, BID, Osvaldo Shipper, MD, 2 tablet at 06/21/23 0834  Labs and Diagnostic Imaging   CBC:  Recent Labs  Lab 06/21/23 0322 06/25/23 1157 06/26/23 0346  WBC 6.0 5.0 5.1  NEUTROABS 3.9 3.6  --   HGB 8.1* 7.7* 7.5*  HCT 24.6* 23.5* 22.6*  MCV  101.2* 101.3* 101.8*  PLT 119* 94* 160    Basic Metabolic Panel:  Lab Results  Component Value Date   NA 139 06/26/2023   K 4.1 06/26/2023   CO2 27 06/26/2023   GLUCOSE 115 (H) 06/26/2023   BUN 14 06/26/2023   CREATININE 0.50 06/26/2023   CALCIUM 8.7 (L) 06/26/2023   GFRNONAA >60 06/26/2023    Latest Reference Range & Units 06/18/23 03:25 06/20/23 14:38 06/21/23 03:22  AST 15 - 41 U/L 46 (H) 45 (H) 62 (H)  ALT 0 - 44 U/L 41 46 (H) 59 (H)  (H): Data is abnormally high  AED levels:  Lab Results  Component Value Date   VALPROATE 61 06/23/2023   Lab Results  Component Value Date   AMMONIA 15 06/20/2023   CT Head without contrast(Personally reviewed): 1. No acute or interval finding. 2. 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  CT angio Head and Neck with contrast(Personally reviewed): 1. No emergent finding. 2. Atherosclerosis which severely affects intracranial circulation. 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  MRI Brain(Personally reviewed): 1. No acute intracranial abnormality. 2. Old left frontal, left parietal and right parietotemporal infarcts. 3. 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   Assessment  Olivia Ross is a 78 y.o. female with a PMHx of dementia and right MCA strokein 2022,  L MCA stroke in nov 2023, HTN, HLD who was brought in by EMS for left gaze deviation and aphasia. She had a GTC seizure that was witnessed by EMS en route with forced L gaze deviation, outstretched arms and was given Versed. She was intubated on arrival to the ED for airway protection. LTM EEG showed continuous generalized slowing without epileptiform abnormalities. She has now been extubated and has had no further clinical or electrographic seizures. Suspect seizure 2/2 known cortical strokes.   Family was first concerned  that the keppra is making Olivia Ross more sedated.  She was then loaded with 1000mg  of valproic acid with scheduled 250 mg PO TID.  VPA level was 47 on 3/22, therefore was increased VPA to 250 mg 4 times per day (Depakote now is in within good therapeutic range at 61), keppra was decreased to 250mg  x 2 days and then discontinued.  Now we are reconsulted to consider changing antiseizure medications again, with consideration that perhaps her seizure had been partially provoked by hypoglycemia.  Even if provoked by hypoglycemia given that this is a recurrent concern for this patient and one of the main issues keeping her in the hospital, with patient being at particularly high risk given her cachexia and poor reserve, would still favor keeping her antiseizure medications within a therapeutic range  Additionally on exam the patient appears quite alert and interested in oral  intake  She does have a slight uptrend in her LFTs but this uptrend started even before Depakote was initiated, and may be related to her pancreatic mass.  Nevertheless I think it is worth rechecking this as well as an ammonia to make sure she is not having any adverse effects from Depakote  If no adverse effects I would continue the Depakote and defer any other titration of her medications to an outpatient basis or only if the patient is having further breakthrough seizures while inpatient  Generally patients that experience sedation with Keppra have less sedation with Depakote so would not favor changing back to Keppra in this patient  Recommendations  -Continue valproic acid at 250 mg 4 times daily for now -Recheck ammonia and liver function test -Neurology will follow-up ammonia level if elevated will likely add L-carnitine -Neurology will follow-up liver function test and confirm no new severe elevations -Otherwise neurology will sign off at this time.  Please reach out if the patient has breakthrough seizures or new questions  or concerns arise ______________________________________________________________________   Brooke Dare MD-PhD [facility] [phone] Available 7 AM to 7 PM, outside these hours please contact Neurologist on call listed on AMION

## 2023-06-21 NOTE — Progress Notes (Signed)
 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  History of seizures.  Patient initially alert but had a seizure and out of the hospital.  She subsequently required intubation in the ED and admission to ICU.  Neurology was consulted.  She was started on Keppra.  She was subsequently extubated.  LTM EEG negative for seizures.  Palliative care consulted for goals of care discussion.  MRI brain showed no acute stroke but showed old strokes.  Neurology signed off on 06/16/2023 and recommended outpatient follow-up with neurology.  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  She was transferred to [facility] service from 06/16/2023 onwards.  Assessment & Plan:   Seizure -Symptoms possibly due to seizure activity. -Neurology evaluation and follow-up appreciated.  LTM EEG negative and MRI of brain did not show any acute findings. -Currently on Keppra.  After discussion with patient's daughter on 06/20/2023 on phone, she was very concerned that patient was very sleepy may be from Keppra.  I communicated the same with neurology/Dr. Otelia Limes: Patient has been started on valproic acid.  If valproate level is therapeutic today, then will decrease Keppra to 250 mg twice daily for 2 days then stop. 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   -MRI of abdomen showed a 2.2 x 2.1 cm lesion in the pancreatic head raising concern for neoplasm. -Because of patient's dementia and other comorbidities, patient is not a candidate for any treatments and may not truly benefit from procedures such as  biopsy.  Daughter agrees about the same.   Goals of care -Palliative care evaluation appreciated.  Currently remains full code. -Overall prognosis is guarded to poor. -Patient has had persistent hypoglycemia despite D10 drip with not much oral intake and improvement in clinical condition.  I recommend that family consider home/residential hospice and comfort measures. -I had a long discussion with daughter on phone on 06/20/2023: She wants her mother to have a decent quality of life but is currently concerned that she is very sedated.  She would want her mother to be less sedated so that she can eat better.  She was still not decided about CODE STATUS and wanted to think about it and talk to her primary care provider.  Hypoglycemia -From poor oral intake.  -Continues to have hypoglycemic episodes despite being on D10 drip.  C-peptide pending.  Constipation -Continue bowel regimen  History of vascular dementia -Fall precautions.  PT/OT recommending home health PT/OT  Anemia of chronic disease -From chronic illnesses.  Hemoglobin currently stable.  Monitor intermittently  Thrombocytopenia -No signs of bleeding.  Monitor intermittently  Oropharyngeal dysphagia -Diet as per SLP recommendations  Elevated LFTs -Mildly elevated.  Monitor intermittently.   DVT prophylaxis: Subcutaneous heparin Code Status: Full Family Communication: Spoke to daughter on phone on 06/20/2023 disposition Plan: Status is: Inpatient Remains inpatient appropriate because: Of severity of illness   Consultants: Neurology/PCCM/palliative care  Procedures: As above  Antimicrobials: None   Subjective: Patient seen and examined at bedside.  Poor historian.  No seizures, agitation, fever or vomiting reported.  Objective: Vitals:   06/21/23 0009 06/21/23 0308 06/21/23 0324 06/21/23 0514  BP: (!) 173/106  (!) 142/75 130/67  Pulse: 93 (!) 111 (!) 119 (!) 116  Resp: 20 13 14 16   Temp: 98 F (36.7 C)   99.4 F (37.4 C) 99.9 F (37.7 C)  TempSrc: Oral  Oral Oral  SpO2: 100% 100% 100% 100%  Weight:  45.5 kg    Height:        Intake/Output Summary (Last 24 hours) at 06/21/2023 0751 Last data filed at 06/21/2023 0640 Gross per 24 hour  Intake 2390 ml  Output 3600 ml  Net -1210 ml   Filed Weights   06/18/23 0422 06/19/23 0241 06/21/23 0308  Weight: 39.5 kg 43 kg 45.5 kg    Examination:  General: On room air and in no distress.  Chronically ill and deconditioned looking.  Extremely thinly built. ENT/neck: No obvious elevated JVD or palpable thyromegaly noted  respiratory: Decreased breath sounds at bases bilaterally with some crackles CVS: S1-S2 heard; intermittently tachycardic  abdominal: Soft, nontender, distended slightly; no organomegaly, normal bowel sounds heard  extremities: No cyanosis; trace lower extremity edema CNS: More awake this morning, answers her name but still slow to respond and slightly confused.  No focal deficit noted Lymph: No obvious palpable lymphadenopathy Skin: No obvious lesions/ecchymosis  psych: Not agitated.  Flat affect currently  musculoskeletal: No obvious joint swelling/deformity    Data Reviewed: I have personally reviewed following labs and imaging studies  CBC: Recent Labs  Lab 06/16/23 0417 06/17/23 0437 06/18/23 0325 06/20/23 0405 06/21/23 0322  WBC 4.9 5.7 5.3 7.3 6.0  NEUTROABS  --   --   --  4.8 3.9  HGB 11.1* 10.8* 11.3* 10.2* 8.1*  HCT 33.7* 32.8* 33.5* 31.4* 24.6*  MCV 98.0 98.2 98.0 100.6* 101.2*  PLT 104* 113* 136* 116* 119*   Basic Metabolic Panel: Recent Labs  Lab 06/15/23 0232 06/16/23 0417 06/17/23 0437 06/18/23 0325 06/19/23 0328 06/20/23 0405 06/21/23 0322  NA 143 139 138 136 135 137 137  K 4.4 3.0* 3.1* 3.9 4.2 4.7 3.8  CL 108 104 104 103 101 102 106  CO2 30 26 27 27 27 26 25   GLUCOSE 92 109* 69* 102* 69* 93 126*  BUN 8 8 7* 12 14 15 15   CREATININE 0.49 0.53 0.56 0.60 0.58 0.53 0.68  CALCIUM 9.3  8.9 8.8* 9.2 9.0 9.5 9.2  MG 1.8 1.9 1.6* 2.2 1.8 1.6* 1.7  PHOS 1.7* 2.6 1.2* 1.3*  --   --   --    GFR: Estimated Creatinine Clearance: 41 mL/min (by C-G formula based on SCr of 0.68 mg/dL). Liver Function Tests: Recent Labs  Lab 06/16/23 0417 06/17/23 0437 06/18/23 0325 06/20/23 1438 06/21/23 0322  AST 28 29 46* 45* 62*  ALT 23 26 41 46* 59*  ALKPHOS 58 70 98 73 80  BILITOT 0.7 0.5 0.4 0.8 0.6  PROT 5.3* 5.0* 5.0* 4.8* 4.5*  ALBUMIN 2.8* 2.6* 2.6* 2.5* 2.2*   No results for input(s): "LIPASE", "AMYLASE" in the last 168 hours. Recent Labs  Lab 06/20/23 1438  AMMONIA 15   Coagulation Profile: No results for input(s): "INR", "PROTIME" in the last 168 hours.  Cardiac Enzymes: No results for input(s): "CKTOTAL", "CKMB", "CKMBINDEX", "TROPONINI" in the last 168 hours. BNP (last 3 results) No results for input(s): "PROBNP" in the last 8760 hours. HbA1C: No results for input(s): "HGBA1C" in the last 72 hours. CBG: Recent Labs  Lab 06/20/23 1643  06/20/23 1938 06/20/23 2043 06/20/23 2318 06/21/23 0306  GLUCAP 76 49* 117* 123* 120*   Lipid Profile: No results for input(s): "CHOL", "HDL", "LDLCALC", "TRIG", "CHOLHDL", "LDLDIRECT" in the last 72 hours. Thyroid Function Tests: No results for input(s): "TSH", "T4TOTAL", "FREET4", "T3FREE", "THYROIDAB" in the last 72 hours.  Anemia Panel: No results for input(s): "VITAMINB12", "FOLATE", "FERRITIN", "TIBC", "IRON", "RETICCTPCT" in the last 72 hours. Sepsis Labs: Recent Labs  Lab 06/14/23 0931  LATICACIDVEN 1.8    Recent Results (from the past 240 hours)  Blood culture (routine x 2)     Status: None   Collection Time: 06/14/23  5:40 AM   Specimen: BLOOD RIGHT HAND  Result Value Ref Range Status   Specimen Description BLOOD RIGHT HAND  Final   Special Requests   Final    BOTTLES DRAWN AEROBIC AND ANAEROBIC Blood Culture adequate volume   Culture   Final    NO GROWTH 5 DAYS Performed at [facility],  [address] [place], [place] [address]    Report Status 06/19/2023 FINAL  Final  Blood culture (routine x 2)     Status: None   Collection Time: 06/14/23  5:44 AM   Specimen: BLOOD RIGHT HAND  Result Value Ref Range Status   Specimen Description BLOOD RIGHT HAND  Final   Special Requests   Final    BOTTLES DRAWN AEROBIC AND ANAEROBIC Blood Culture results may not be optimal due to an inadequate volume of blood received in culture bottles   Culture   Final    NO GROWTH 5 DAYS Performed at [facility], [address] [place], [place] [address]    Report Status 06/19/2023 FINAL  Final  Resp panel by RT-PCR (RSV, Flu A&B, Covid) Anterior Nasal Swab     Status: None   Collection Time: 06/14/23  6:45 AM   Specimen: Anterior Nasal Swab  Result Value Ref Range Status   SARS Coronavirus 2 by RT PCR NEGATIVE NEGATIVE Final   Influenza A by PCR NEGATIVE NEGATIVE Final   Influenza B by PCR NEGATIVE NEGATIVE Final    Comment: (NOTE) The Xpert Xpress SARS-CoV-2/FLU/RSV plus assay is intended as an aid in the diagnosis of influenza from Nasopharyngeal swab specimens and should not be used as a sole basis for treatment. Nasal washings and aspirates are unacceptable for Xpert Xpress SARS-CoV-2/FLU/RSV testing.  Fact Sheet for Patients: BloggerCourse.com  Fact Sheet for Healthcare Providers: SeriousBroker.it  This test is not yet approved or cleared by the [place] FDA and has been authorized for detection and/or diagnosis of SARS-CoV-2 by FDA under an Emergency Use Authorization (EUA). This EUA will remain in effect (meaning this test can be used) for the duration of the COVID-19 declaration under Section 564(b)(1) of the Act, 21 U.S.C. section 360bbb-3(b)(1), unless the authorization is terminated or revoked.     Resp Syncytial Virus by PCR NEGATIVE NEGATIVE Final    Comment: (NOTE) Fact Sheet for  Patients: BloggerCourse.com  Fact Sheet for Healthcare Providers: SeriousBroker.it  This test is not yet approved or cleared by the [place] FDA and has been authorized for detection and/or diagnosis of SARS-CoV-2 by FDA under an Emergency Use Authorization (EUA). This EUA will remain in effect (meaning this test can be used) for the duration of the COVID-19 declaration under Section 564(b)(1) of the Act, 21 U.S.C. section 360bbb-3(b)(1), unless the authorization is terminated or revoked.  Performed at [facility], [address] [place],  [place] [address]   MRSA Next Gen by PCR, Nasal     Status: None   Collection Time: 06/14/23  2:15 PM   Specimen: Nasal Mucosa; Nasal Swab  Result Value Ref Range Status   MRSA by PCR Next Gen NOT DETECTED NOT DETECTED Final    Comment: (NOTE) The GeneXpert MRSA Assay (FDA approved for NASAL specimens only), is one component of a comprehensive MRSA colonization surveillance program. It is not intended to diagnose MRSA infection nor to guide or monitor treatment for MRSA infections. Test performance is not FDA approved in patients less than 12 years old. Performed at [facility], [address], [place], [place] [address]          Radiology Studies: No results found.       Scheduled Meds:  amLODipine  5 mg Oral Daily   Chlorhexidine Gluconate Cloth  6 each Topical Daily   dextrose       divalproex  250 mg Oral TID   feeding supplement  237 mL Oral BID BM   folic acid  1 mg Oral Daily   heparin  5,000 Units Subcutaneous Q8H   irbesartan  75 mg Oral Daily   mouth rinse  15 mL Mouth Rinse 4 times per day   pantoprazole  40 mg Oral Q1200   polyethylene glycol  17 g Oral BID   senna-docusate  2 tablet Oral BID   Continuous Infusions:  dextrose 125 mL/hr at 06/21/23 0021   levETIRAcetam Stopped (06/21/23 0349)          Glade Lloyd, MD [facility] 06/21/2023, 7:51 AM

## 2023-06-22 DIAGNOSIS — G40409 Other generalized epilepsy and epileptic syndromes, not intractable, without status epilepticus: Secondary | ICD-10-CM | POA: Diagnosis not present

## 2023-06-22 LAB — BASIC METABOLIC PANEL
Anion gap: 11 (ref 5–15)
BUN: 14 mg/dL (ref 8–23)
CO2: 24 mmol/L (ref 22–32)
Calcium: 9.3 mg/dL (ref 8.9–10.3)
Chloride: 105 mmol/L (ref 98–111)
Creatinine, Ser: 0.49 mg/dL (ref 0.44–1.00)
GFR, Estimated: >60 mL/min (ref >60)
Glucose, Bld: 89 mg/dL (ref 70–99)
Potassium: 3.2 mmol/L — ABNORMAL LOW (ref 3.5–5.1)
Sodium: 140 mmol/L (ref 135–145)

## 2023-06-22 LAB — GLUCOSE, CAPILLARY
Glucose-Capillary: 39 mg/dL — CL (ref 70–99)
Glucose-Capillary: 58 mg/dL — ABNORMAL LOW (ref 70–99)
Glucose-Capillary: 58 mg/dL — ABNORMAL LOW (ref 70–99)
Glucose-Capillary: 63 mg/dL — ABNORMAL LOW (ref 70–99)
Glucose-Capillary: 68 mg/dL — ABNORMAL LOW (ref 70–99)
Glucose-Capillary: 81 mg/dL (ref 70–99)
Glucose-Capillary: 84 mg/dL (ref 70–99)
Glucose-Capillary: 93 mg/dL (ref 70–99)

## 2023-06-22 LAB — C-PEPTIDE: C-Peptide: 7.5 ng/mL — ABNORMAL HIGH (ref 1.1–4.4)

## 2023-06-22 LAB — MAGNESIUM: Magnesium: 1.5 mg/dL — ABNORMAL LOW (ref 1.7–2.4)

## 2023-06-22 MED ORDER — GLUCOSE 40 % PO GEL
1.0000 | Freq: Once | ORAL | Status: AC
  Administered 2023-06-22: 31 g via ORAL
  Filled 2023-06-22: qty 1.21

## 2023-06-22 MED ORDER — POTASSIUM CHLORIDE 10 MEQ/100ML IV SOLN
10.0000 meq | INTRAVENOUS | Status: AC
  Administered 2023-06-22 (×5): 10 meq via INTRAVENOUS
  Filled 2023-06-22 (×4): qty 100

## 2023-06-22 MED ORDER — POTASSIUM CHLORIDE CRYS ER 20 MEQ PO TBCR
40.0000 meq | EXTENDED_RELEASE_TABLET | ORAL | Status: DC
  Filled 2023-06-22: qty 2

## 2023-06-22 MED ORDER — MAGNESIUM SULFATE 2 GM/50ML IV SOLN
2.0000 g | Freq: Once | INTRAVENOUS | Status: AC
  Administered 2023-06-22: 2 g via INTRAVENOUS
  Filled 2023-06-22: qty 50

## 2023-06-22 MED ORDER — DEXTROSE 50 % IV SOLN
INTRAVENOUS | Status: AC
  Administered 2023-06-22: 50 mL
  Filled 2023-06-22: qty 50

## 2023-06-22 NOTE — Progress Notes (Addendum)
 12am CBG- 58  Treatment- 25mL d50  Recheck CBG- [address]  Margarito Liner, RN

## 2023-06-22 NOTE — Progress Notes (Signed)
 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  History of seizures.  Patient initially alert but had a seizure and out of the hospital.  She subsequently required intubation in the ED and admission to ICU.  Neurology was consulted.  She was started on Keppra.  She was subsequently extubated.  LTM EEG negative for seizures.  Palliative care consulted for goals of care discussion.  MRI brain showed no acute stroke but showed old strokes.  Neurology signed off on 06/16/2023 and recommended outpatient follow-up with neurology.  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  She was transferred to [facility] service from 06/16/2023 onwards.  Assessment & Plan:   Seizure -Symptoms possibly due to seizure activity. -Neurology evaluation and follow-up appreciated.  LTM EEG negative and MRI of brain did not show any acute findings. -Currently on Keppra.  After discussion with patient's daughter on 06/20/2023 on phone, she was very concerned that patient was very sleepy may be from Keppra.  I communicated the same with neurology/Dr. Otelia Limes: Patient has been started on valproic acid. Decrease Keppra to 250 mg twice daily for 2 days then stop as per neurology. 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   -MRI of abdomen showed a 2.2 x 2.1 cm lesion in the pancreatic head raising concern for neoplasm. -Because of patient's dementia and other comorbidities, patient is not a candidate for any treatments and may not truly benefit from procedures such as biopsy.  Daughter agrees about the  same.  Goals of care -Palliative care evaluation appreciated.  Currently remains full code. -Overall prognosis is guarded to poor. -Patient has had persistent hypoglycemia despite D10 drip with not much oral intake and improvement in clinical condition.  I recommend that family consider home/residential hospice and comfort measures. -I had a long discussion with daughter on phone on 06/20/2023: She wants her mother to have a decent quality of life but is currently concerned that she is very sedated.  She would want her mother to be less sedated so that she can eat better.  She was still not decided about CODE STATUS and wanted to think about it and talk to her primary care provider.  Hypoglycemia -From poor oral intake.  -Continues to have hypoglycemic episodes despite being on D10 drip.  C-peptide pending.  Hypokalemia--replace  Hypomagnesemia -Replace.  Repeat a.m. labs  Constipation -Continue bowel regimen  History of vascular dementia -Fall precautions.  PT/OT recommending home health PT/OT  Anemia of chronic disease -From chronic illnesses.  Hemoglobin currently stable.  Monitor intermittently  Thrombocytopenia -No signs of bleeding.  Monitor intermittently  Oropharyngeal dysphagia -Diet as per SLP recommendations  Elevated LFTs -Mildly elevated.  Monitor intermittently.   DVT prophylaxis: Subcutaneous heparin Code Status: Full Family Communication: Spoke to daughter on phone on 06/20/2023 disposition Plan: Status is: Inpatient Remains inpatient appropriate because: Of severity of illness   Consultants: Neurology/PCCM/palliative care  Procedures: As above  Antimicrobials: None   Subjective: Patient seen and examined at bedside.  Poor historian.  No agitation, fever, vomiting reported.  Patient continues to have intermittent hypoglycemia episodes as per nursing staff.   Objective: Vitals:   06/21/23 1700 06/21/23 2030 06/21/23 2345 06/22/23 0340  BP: (!)  144/63 (!) 157/68 (!) 167/61 (!) 129/53  Pulse: 99 86 96   Resp:  16 15 16   Temp: 98.3 F (36.8 C) 97.7 F (36.5 C) 97.9 F (36.6 C)   TempSrc: Oral Axillary Axillary   SpO2: 100% 100% 100% 100%  Weight:      Height:        Intake/Output Summary (Last 24 hours) at 06/22/2023 0705 Last data filed at 06/22/2023 0552 Gross per 24 hour  Intake 3205.02 ml  Output 2000 ml  Net 1205.02 ml   Filed Weights   06/18/23 0422 06/19/23 0241 06/21/23 0308  Weight: 39.5 kg 43 kg 45.5 kg    Examination:  General: No acute distress.  Remains on room air.  Chronically ill and deconditioned looking.  Extremely thinly built. ENT/neck: No palpable neck masses or JVD elevation noted respiratory: Bilateral decreased breath sounds at bases with scattered crackles, no wheezing CVS: Rate controlled; S1 and S2 are heard abdominal: Soft, nontender, mildly distended; no organomegaly, bowel sounds normally heard extremities: Mild lower extremity edema present; no clubbing CNS: Sleepy, wakes up only very slightly, hardly answers any questions.  No obvious focal deficits  lymph: No lymphadenopathy palpable Skin: No obvious rashes/petechiae psych: Extremely flat affect.  Currently not agitated.   Musculoskeletal: No obvious joint erythema/tenderness   Data Reviewed: I have personally reviewed following labs and imaging studies  CBC: Recent Labs  Lab 06/16/23 0417 06/17/23 0437 06/18/23 0325 06/20/23 0405 06/21/23 0322  WBC 4.9 5.7 5.3 7.3 6.0  NEUTROABS  --   --   --  4.8 3.9  HGB 11.1* 10.8* 11.3* 10.2* 8.1*  HCT 33.7* 32.8* 33.5* 31.4* 24.6*  MCV 98.0 98.2 98.0 100.6* 101.2*  PLT 104* 113* 136* 116* 119*   Basic Metabolic Panel: Recent Labs  Lab 06/16/23 0417 06/17/23 0437 06/18/23 0325 06/19/23 0328 06/20/23 0405 06/21/23 0322 06/22/23 0321  NA 139 138 136 135 137 137 140  K 3.0* 3.1* 3.9 4.2 4.7 3.8 3.2*  CL 104 104 103 101 102 106 105  CO2 26 27 27 27 26 25 24   GLUCOSE 109* 69*  102* 69* 93 126* 89  BUN 8 7* 12 14 15 15 14   CREATININE 0.53 0.56 0.60 0.58 0.53 0.68 0.49  CALCIUM 8.9 8.8* 9.2 9.0 9.5 9.2 9.3  MG 1.9 1.6* 2.2 1.8 1.6* 1.7 1.5*  PHOS 2.6 1.2* 1.3*  --   --   --   --    GFR: Estimated Creatinine Clearance: 41 mL/min (by C-G formula based on SCr of 0.49 mg/dL). Liver Function Tests: Recent Labs  Lab 06/16/23 0417 06/17/23 0437 06/18/23 0325 06/20/23 1438 06/21/23 0322  AST 28 29 46* 45* 62*  ALT 23 26 41 46* 59*  ALKPHOS 58 70 98 73 80  BILITOT 0.7 0.5 0.4 0.8 0.6  PROT 5.3* 5.0* 5.0* 4.8* 4.5*  ALBUMIN 2.8* 2.6* 2.6* 2.5* 2.2*   No results for input(s): "LIPASE", "AMYLASE" in the last 168 hours. Recent Labs  Lab 06/20/23 1438  AMMONIA 15   Coagulation Profile: No results for input(s): "INR", "PROTIME" in the last 168 hours.  Cardiac Enzymes: No results for input(s): "CKTOTAL", "CKMB", "CKMBINDEX", "TROPONINI" in the last 168 hours. BNP (last 3 results) No results for input(s): "PROBNP" in the last 8760 hours. HbA1C: No results for input(s): "HGBA1C"  in the last 72 hours. CBG: Recent Labs  Lab 06/21/23 1706 06/21/23 2008 06/22/23 0000 06/22/23 0047 06/22/23 0409  GLUCAP 102* 85 58* 84 93   Lipid Profile: No results for input(s): "CHOL", "HDL", "LDLCALC", "TRIG", "CHOLHDL", "LDLDIRECT" in the last 72 hours. Thyroid Function Tests: No results for input(s): "TSH", "T4TOTAL", "FREET4", "T3FREE", "THYROIDAB" in the last 72 hours.  Anemia Panel: No results for input(s): "VITAMINB12", "FOLATE", "FERRITIN", "TIBC", "IRON", "RETICCTPCT" in the last 72 hours. Sepsis Labs: No results for input(s): "PROCALCITON", "LATICACIDVEN" in the last 168 hours.   Recent Results (from the past 240 hours)  Blood culture (routine x 2)     Status: None   Collection Time: 06/14/23  5:40 AM   Specimen: BLOOD RIGHT HAND  Result Value Ref Range Status   Specimen Description BLOOD RIGHT HAND  Final   Special Requests   Final    BOTTLES DRAWN  AEROBIC AND ANAEROBIC Blood Culture adequate volume   Culture   Final    NO GROWTH 5 DAYS Performed at [facility], [address] [place], [place] [address]    Report Status 06/19/2023 FINAL  Final  Blood culture (routine x 2)     Status: None   Collection Time: 06/14/23  5:44 AM   Specimen: BLOOD RIGHT HAND  Result Value Ref Range Status   Specimen Description BLOOD RIGHT HAND  Final   Special Requests   Final    BOTTLES DRAWN AEROBIC AND ANAEROBIC Blood Culture results may not be optimal due to an inadequate volume of blood received in culture bottles   Culture   Final    NO GROWTH 5 DAYS Performed at [facility], [address], [place], [place] [address]    Report Status 06/19/2023 FINAL  Final  Resp panel by RT-PCR (RSV, Flu A&B, Covid) Anterior Nasal Swab     Status: None   Collection Time: 06/14/23  6:45 AM   Specimen: Anterior Nasal Swab  Result Value Ref Range Status   SARS Coronavirus 2 by RT PCR NEGATIVE NEGATIVE Final   Influenza A by PCR NEGATIVE NEGATIVE Final   Influenza B by PCR NEGATIVE NEGATIVE Final    Comment: (NOTE) The Xpert Xpress SARS-CoV-2/FLU/RSV plus assay is intended as an aid in the diagnosis of influenza from Nasopharyngeal swab specimens and should not be used as a sole basis for treatment. Nasal washings and aspirates are unacceptable for Xpert Xpress SARS-CoV-2/FLU/RSV testing.  Fact Sheet for Patients: BloggerCourse.com  Fact Sheet for Healthcare Providers: SeriousBroker.it  This test is not yet approved or cleared by the [place] FDA and has been authorized for detection and/or diagnosis of SARS-CoV-2 by FDA under an Emergency Use Authorization (EUA). This EUA will remain in effect (meaning this test can be used) for the duration of the COVID-19 declaration under Section 564(b)(1) of the Act, 21 U.S.C. section 360bbb-3(b)(1), unless the authorization is terminated  or revoked.     Resp Syncytial Virus by PCR NEGATIVE NEGATIVE Final    Comment: (NOTE) Fact Sheet for Patients: BloggerCourse.com  Fact Sheet for Healthcare Providers: SeriousBroker.it  This test is not yet approved or cleared by the [place] FDA and has been authorized for detection and/or diagnosis of SARS-CoV-2 by FDA under an Emergency Use Authorization (EUA). This EUA will remain in effect (meaning this test can be used) for the duration of the COVID-19 declaration under Section 564(b)(1) of the Act, 21 U.S.C. section 360bbb-3(b)(1), unless the authorization is terminated or  revoked.  Performed at [facility], [address] [place], [place] [address]   MRSA Next Gen by PCR, Nasal     Status: None   Collection Time: 06/14/23  2:15 PM   Specimen: Nasal Mucosa; Nasal Swab  Result Value Ref Range Status   MRSA by PCR Next Gen NOT DETECTED NOT DETECTED Final    Comment: (NOTE) The GeneXpert MRSA Assay (FDA approved for NASAL specimens only), is one component of a comprehensive MRSA colonization surveillance program. It is not intended to diagnose MRSA infection nor to guide or monitor treatment for MRSA infections. Test performance is not FDA approved in patients less than 81 years old. Performed at [facility], [address] [place], [place] [address]          Radiology Studies: No results found.       Scheduled Meds:  amLODipine  5 mg Oral Daily   Chlorhexidine Gluconate Cloth  6 each Topical Daily   divalproex  250 mg Oral QID   feeding supplement  237 mL Oral BID BM   folic acid  1 mg Oral Daily   heparin  5,000 Units Subcutaneous Q8H   irbesartan  75 mg Oral Daily   mouth rinse  15 mL Mouth Rinse 4 times per day   pantoprazole  40 mg Oral Q1200   polyethylene glycol  17 g Oral BID   senna-docusate  2 tablet Oral BID   Continuous Infusions:  dextrose 125 mL/hr at 06/22/23 0552    levETIRAcetam Stopped (06/21/23 1612)          Glade Lloyd, MD [facility] 06/22/2023, 7:05 AM

## 2023-06-23 DIAGNOSIS — G40409 Other generalized epilepsy and epileptic syndromes, not intractable, without status epilepticus: Secondary | ICD-10-CM | POA: Diagnosis not present

## 2023-06-23 DIAGNOSIS — Z515 Encounter for palliative care: Secondary | ICD-10-CM | POA: Diagnosis not present

## 2023-06-23 DIAGNOSIS — Z7189 Other specified counseling: Secondary | ICD-10-CM | POA: Diagnosis not present

## 2023-06-23 DIAGNOSIS — E876 Hypokalemia: Secondary | ICD-10-CM | POA: Diagnosis not present

## 2023-06-23 LAB — GLUCOSE, CAPILLARY
Glucose-Capillary: 107 mg/dL — ABNORMAL HIGH (ref 70–99)
Glucose-Capillary: 42 mg/dL — CL (ref 70–99)
Glucose-Capillary: 41 mg/dL — CL (ref 70–99)
Glucose-Capillary: 69 mg/dL — ABNORMAL LOW (ref 70–99)
Glucose-Capillary: 12 mg/dL — CL (ref 70–99)
Glucose-Capillary: 14 mg/dL — CL (ref 70–99)
Glucose-Capillary: 27 mg/dL — CL (ref 70–99)
Glucose-Capillary: 82 mg/dL (ref 70–99)
Glucose-Capillary: 40 mg/dL — CL (ref 70–99)
Glucose-Capillary: 29 mg/dL — CL (ref 70–99)
Glucose-Capillary: 141 mg/dL — ABNORMAL HIGH (ref 70–99)
Glucose-Capillary: 97 mg/dL (ref 70–99)

## 2023-06-23 LAB — BASIC METABOLIC PANEL
Anion gap: 11 (ref 5–15)
Anion gap: 8 (ref 5–15)
Anion gap: 10 (ref 5–15)
Anion gap: 8 (ref 5–15)
BUN: 15 mg/dL (ref 8–23)
BUN: 14 mg/dL (ref 8–23)
BUN: 14 mg/dL (ref 8–23)
BUN: 16 mg/dL (ref 8–23)
CO2: 20 mmol/L — ABNORMAL LOW (ref 22–32)
CO2: 24 mmol/L (ref 22–32)
CO2: 21 mmol/L — ABNORMAL LOW (ref 22–32)
CO2: 24 mmol/L (ref 22–32)
Calcium: 9 mg/dL (ref 8.9–10.3)
Calcium: 9.3 mg/dL (ref 8.9–10.3)
Calcium: 9.1 mg/dL (ref 8.9–10.3)
Calcium: 9.5 mg/dL (ref 8.9–10.3)
Chloride: 106 mmol/L (ref 98–111)
Chloride: 105 mmol/L (ref 98–111)
Chloride: 104 mmol/L (ref 98–111)
Chloride: 102 mmol/L (ref 98–111)
Creatinine, Ser: 0.53 mg/dL (ref 0.44–1.00)
Creatinine, Ser: 0.54 mg/dL (ref 0.44–1.00)
Creatinine, Ser: 0.56 mg/dL (ref 0.44–1.00)
Creatinine, Ser: 0.61 mg/dL (ref 0.44–1.00)
GFR, Estimated: >60 mL/min (ref >60)
GFR, Estimated: >60 mL/min (ref >60)
GFR, Estimated: >60 mL/min (ref >60)
GFR, Estimated: >60 mL/min (ref >60)
Glucose, Bld: 114 mg/dL — ABNORMAL HIGH (ref 70–99)
Glucose, Bld: 145 mg/dL — ABNORMAL HIGH (ref 70–99)
Glucose, Bld: 170 mg/dL — ABNORMAL HIGH (ref 70–99)
Glucose, Bld: 94 mg/dL (ref 70–99)
Potassium: 3.7 mmol/L (ref 3.5–5.1)
Potassium: 3.9 mmol/L (ref 3.5–5.1)
Potassium: 3.7 mmol/L (ref 3.5–5.1)
Potassium: 3.9 mmol/L (ref 3.5–5.1)
Sodium: 137 mmol/L (ref 135–145)
Sodium: 137 mmol/L (ref 135–145)
Sodium: 135 mmol/L (ref 135–145)
Sodium: 134 mmol/L — ABNORMAL LOW (ref 135–145)

## 2023-06-23 LAB — MAGNESIUM: Magnesium: 1.4 mg/dL — ABNORMAL LOW (ref 1.7–2.4)

## 2023-06-23 LAB — VALPROIC ACID LEVEL: Valproic Acid Lvl: 61 ug/mL (ref 50.0–100.0)

## 2023-06-23 MED ORDER — DEXTROSE 50 % IV SOLN
1.0000 | Freq: Once | INTRAVENOUS | Status: AC
  Administered 2023-06-23: 50 mL via INTRAVENOUS

## 2023-06-23 MED ORDER — OCTREOTIDE ACETATE 100 MCG/ML IJ SOLN
100.0000 ug | Freq: Three times a day (TID) | INTRAMUSCULAR | Status: DC
  Administered 2023-06-24 – 2023-07-03 (×29): 100 ug via SUBCUTANEOUS
  Filled 2023-06-23 (×33): qty 1

## 2023-06-23 MED ORDER — ADULT MULTIVITAMIN W/MINERALS CH
1.0000 | ORAL_TABLET | Freq: Every day | ORAL | Status: DC
  Administered 2023-06-23 – 2023-07-03 (×11): 1 via ORAL
  Filled 2023-06-23 (×11): qty 1

## 2023-06-23 MED ORDER — OCTREOTIDE ACETATE 100 MCG/ML IJ SOLN
100.0000 ug | Freq: Once | INTRAMUSCULAR | Status: AC
  Administered 2023-06-23: 100 ug via SUBCUTANEOUS
  Filled 2023-06-23 (×2): qty 1

## 2023-06-23 MED ORDER — OCTREOTIDE ACETATE 100 MCG/ML IJ SOLN
100.0000 ug | Freq: Once | INTRAMUSCULAR | Status: DC
  Filled 2023-06-23: qty 1

## 2023-06-23 MED ORDER — DEXTROSE 50 % IV SOLN
1.0000 | INTRAVENOUS | Status: DC | PRN
  Administered 2023-06-23 – 2023-06-30 (×13): 50 mL via INTRAVENOUS
  Filled 2023-06-23 (×13): qty 50

## 2023-06-23 MED ORDER — OCTREOTIDE ACETATE 100 MCG/ML IJ SOLN
100.0000 ug | Freq: Once | INTRAMUSCULAR | Status: AC
  Administered 2023-06-23: 100 ug via SUBCUTANEOUS
  Filled 2023-06-23: qty 1

## 2023-06-23 NOTE — TOC CM/SW Note (Signed)
   Durable Medical Equipment (From admission, onward)        Start     Ordered  06/20/23 1258  For home use only DME Hospital bed  Once      Question Answer Comment Length of Need 6 Months  Patient has (list medical condition): hx stroke  The above medical condition requires: Patient requires the ability to reposition frequently  Head must be elevated greater than: 30 degrees  Bed type Semi-electric  Support Surface: Gel Overlay    06/20/23 1257

## 2023-06-23 NOTE — Plan of Care (Signed)

## 2023-06-23 NOTE — Progress Notes (Signed)
 Daily Progress Note   Patient Name: Georgeanna Radziewicz       Date: 06/23/2023 DOB: 1945-10-03  Age: 78 y.o. MRN#: [id number] Attending Physician: Glade Lloyd, MD Primary Care Physician: Stevphen Rochester, MD Admit Date: 06/14/2023  Reason for Consultation/Follow-up: Establishing goals of care  Subjective: Medical records reviewed including progress notes, labs, imaging. Patient assessed at the bedside. No acute distress. Discussed with RN.  Received update that patient's daughter called PMT with concerns about sandostatin and request for more updates. Attempted to call her back but was unable to reach. Left voicemail with request for return call.  Questions and concerns addressed. PMT will continue to support holistically.   Length of Stay: 9   Physical Exam Vitals and nursing note reviewed.  Constitutional:      Appearance: She is ill-appearing.  Cardiovascular:     Rate and Rhythm: Normal rate.  Pulmonary:     Effort: Pulmonary effort is normal.  Neurological:     Mental Status: She is disoriented and confused.  Psychiatric:        Cognition and Memory: Cognition is impaired.            Vital Signs: BP 134/65 (BP Location: Right Arm)   Pulse 89   Temp 97.7 F (36.5 C) (Oral)   Resp 19   Ht 5\' 3"  (1.6 m)   Wt 45.5 kg   SpO2 100%   BMI 17.77 kg/m  SpO2: SpO2: 100 % O2 Device: O2 Device: Room Air O2 Flow Rate: O2 Flow Rate (L/min): 1 L/min      Palliative Assessment/Data: 30%   Palliative Care Assessment & Plan   Patient Profile: 78 y.o. 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  She also had a seizure en route to the hospital and was treated accordingly. PMT has been consulted to assist with goals of care conversation.  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 Unable to reach her today. Patient is with limited long-term options Goal is to balance management of seizures with minimal sedation and maximizing quality of life Ongoing goals of care discussions  PMT to continue to follow and support   Prognosis:  Poor  Discharge Planning: To Be Determined  Care plan was discussed with RN, MD          Rico Ala, PA-C  Palliative Medicine Team Team phone # [phone]  Thank you for allowing the Palliative Medicine Team to assist in the care of this patient. Please utilize secure chat with additional questions, if there is no response within 30 minutes please call the above phone number.  Palliative Medicine Team providers are available by phone from 7am to 7pm daily and can be reached through the team cell phone.  Should this patient require assistance outside of these hours, please call the patient's attending physician.

## 2023-06-23 NOTE — Progress Notes (Addendum)
 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  History of seizures.  Patient initially alert but had a seizure and out of the hospital.  She subsequently required intubation in the ED and admission to ICU.  Neurology was consulted.  She was started on Keppra.  She was subsequently extubated.  LTM EEG negative for seizures.  Palliative care consulted for goals of care discussion.  MRI brain showed no acute stroke but showed old strokes.  Neurology signed off on 06/16/2023 and recommended outpatient follow-up with neurology.  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  She was transferred to [facility] service from 06/16/2023 onwards.  Assessment & Plan:   Seizure -Symptoms possibly due to seizure activity. -Neurology evaluation and follow-up appreciated.  LTM EEG negative and MRI of brain did not show any acute findings. -Currently on Keppra.  After discussion with patient's daughter on 06/20/2023 on phone, she was very concerned that patient was very sleepy may be from Keppra.  I communicated the same with neurology/Dr. Otelia Limes: Patient has been started on valproic acid. Decrease Keppra to 250 mg twice daily for 2 days then stop as per neurology. 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   -MRI of abdomen showed a 2.2 x 2.1 cm lesion in the pancreatic head raising concern for neoplasm. -Because of patient's dementia and other comorbidities, patient is not a candidate for any treatments and may not truly benefit from procedures such as biopsy.  Daughter agrees about the  same.  Goals of care -Palliative care evaluation appreciated.  Currently remains full code. -Overall prognosis is guarded to poor. -Patient has had persistent hypoglycemia despite D10 drip with not much oral intake and improvement in clinical condition.  I recommend that family consider home/residential hospice and comfort measures. -I had a long discussion with daughter on phone on 06/20/2023: She wants her mother to have a decent quality of life but is currently concerned that she is very sedated.  She would want her mother to be less sedated so that she can eat better.  She was still not decided about CODE STATUS and wanted to think about it and talk to her primary care provider.  Hypoglycemia -From poor oral intake.  -Continues to have hypoglycemic episodes despite being on D10 drip.  C-peptide slightly elevated. -Will try Sandostatin 1 dose and monitor response.  Might need scheduled doses.  Hypokalemia--labs pending today  Hypomagnesemia -Labs pending today  Constipation -Continue bowel regimen  History of vascular dementia -Fall precautions.  PT/OT recommending home health PT/OT  Anemia of chronic disease -From chronic illnesses.  Hemoglobin currently stable.  Monitor intermittently  Thrombocytopenia -No signs of bleeding.  Monitor intermittently  Oropharyngeal dysphagia -Diet as per SLP recommendations  Elevated LFTs -Mildly elevated.  Monitor intermittently.  Poor oral intake -Dietitian consulted   DVT prophylaxis: Subcutaneous heparin Code Status: Full Family Communication: Spoke to daughter on phone on 06/23/2023 disposition Plan: Status is: Inpatient Remains inpatient appropriate because: Of severity of illness   Consultants: Neurology/PCCM/palliative care  Procedures: As above  Antimicrobials:  None   Subjective: Patient seen and examined at bedside.  Poor historian.  Continues to have hypoglycemia episodes despite being on D10 drip as per nursing  staff.  No agitation, seizures or vomiting reported. Objective: Vitals:   06/22/23 2008 06/22/23 2320 06/23/23 0000 06/23/23 0313  BP: 137/69 (!) 136/57  134/65  Pulse: 96 86 84 89  Resp: 12 12 16 19   Temp: 97.7 F (36.5 C) 97.8 F (36.6 C)  97.7 F (36.5 C)  TempSrc: Oral Oral  Oral  SpO2: 96% 100% 100% 100%  Weight:      Height:        Intake/Output Summary (Last 24 hours) at 06/23/2023 0807 Last data filed at 06/23/2023 0600 Gross per 24 hour  Intake 3074.7 ml  Output 2375 ml  Net 699.7 ml   Filed Weights   06/18/23 0422 06/19/23 0241 06/21/23 0308  Weight: 39.5 kg 43 kg 45.5 kg    Examination:  General: On room air.  No distress.  Chronically ill and deconditioned looking.  Extremely thinly built. ENT/neck: No JVD elevation or palpable thyromegaly noted respiratory: Decreased breath sounds at bases bilaterally with some crackles  CVS: 1 S2 heard; currently rate controlled  abdominal: Soft, nontender, distended slightly; no organomegaly, normal bowel sounds are heard extremities: No cyanosis; trace lower extremity edema present CNS: Remains very slow to respond.  No focal deficits noted lymph: No cervical lymphadenopathy palpable Skin: No obvious ecchymosis/lesions psych: Showing no signs of agitation currently.  Flat affect mostly.  Musculoskeletal: No obvious joint swelling/deformity   Data Reviewed: I have personally reviewed following labs and imaging studies  CBC: Recent Labs  Lab 06/17/23 0437 06/18/23 0325 06/20/23 0405 06/21/23 0322  WBC 5.7 5.3 7.3 6.0  NEUTROABS  --   --  4.8 3.9  HGB 10.8* 11.3* 10.2* 8.1*  HCT 32.8* 33.5* 31.4* 24.6*  MCV 98.2 98.0 100.6* 101.2*  PLT 113* 136* 116* 119*   Basic Metabolic Panel: Recent Labs  Lab 06/17/23 0437 06/18/23 0325 06/19/23 0328 06/20/23 0405 06/21/23 0322 06/22/23 0321  NA 138 136 135 137 137 140  K 3.1* 3.9 4.2 4.7 3.8 3.2*  CL 104 103 101 102 106 105  CO2 27 27 27 26 25 24   GLUCOSE 69* 102*  69* 93 126* 89  BUN 7* 12 14 15 15 14   CREATININE 0.56 0.60 0.58 0.53 0.68 0.49  CALCIUM 8.8* 9.2 9.0 9.5 9.2 9.3  MG 1.6* 2.2 1.8 1.6* 1.7 1.5*  PHOS 1.2* 1.3*  --   --   --   --    GFR: Estimated Creatinine Clearance: 41 mL/min (by C-G formula based on SCr of 0.49 mg/dL). Liver Function Tests: Recent Labs  Lab 06/17/23 0437 06/18/23 0325 06/20/23 1438 06/21/23 0322  AST 29 46* 45* 62*  ALT 26 41 46* 59*  ALKPHOS 70 98 73 80  BILITOT 0.5 0.4 0.8 0.6  PROT 5.0* 5.0* 4.8* 4.5*  ALBUMIN 2.6* 2.6* 2.5* 2.2*   No results for input(s): "LIPASE", "AMYLASE" in the last 168 hours. Recent Labs  Lab 06/20/23 1438  AMMONIA 15   Coagulation Profile: No results for input(s): "INR", "PROTIME" in the last 168 hours.  Cardiac Enzymes: No results for input(s): "CKTOTAL", "CKMB", "CKMBINDEX", "TROPONINI" in the last 168 hours. BNP (last 3 results) No results for input(s): "PROBNP" in the last 8760 hours. HbA1C: No results for input(s): "HGBA1C" in the last 72 hours. CBG: Recent Labs  Lab 06/22/23 2357 06/23/23 0108 06/23/23 0402 06/23/23  0459 06/23/23 0500  GLUCAP 39* 107* 42* 41* 69*   Lipid Profile: No results for input(s): "CHOL", "HDL", "LDLCALC", "TRIG", "CHOLHDL", "LDLDIRECT" in the last 72 hours. Thyroid Function Tests: No results for input(s): "TSH", "T4TOTAL", "FREET4", "T3FREE", "THYROIDAB" in the last 72 hours.  Anemia Panel: No results for input(s): "VITAMINB12", "FOLATE", "FERRITIN", "TIBC", "IRON", "RETICCTPCT" in the last 72 hours. Sepsis Labs: No results for input(s): "PROCALCITON", "LATICACIDVEN" in the last 168 hours.   Recent Results (from the past 240 hours)  Blood culture (routine x 2)     Status: None   Collection Time: 06/14/23  5:40 AM   Specimen: BLOOD RIGHT HAND  Result Value Ref Range Status   Specimen Description BLOOD RIGHT HAND  Final   Special Requests   Final    BOTTLES DRAWN AEROBIC AND ANAEROBIC Blood Culture adequate volume   Culture    Final    NO GROWTH 5 DAYS Performed at [facility], [address], [place], [place] [address]    Report Status 06/19/2023 FINAL  Final  Blood culture (routine x 2)     Status: None   Collection Time: 06/14/23  5:44 AM   Specimen: BLOOD RIGHT HAND  Result Value Ref Range Status   Specimen Description BLOOD RIGHT HAND  Final   Special Requests   Final    BOTTLES DRAWN AEROBIC AND ANAEROBIC Blood Culture results may not be optimal due to an inadequate volume of blood received in culture bottles   Culture   Final    NO GROWTH 5 DAYS Performed at [facility], [address] [place], [place] [address]    Report Status 06/19/2023 FINAL  Final  Resp panel by RT-PCR (RSV, Flu A&B, Covid) Anterior Nasal Swab     Status: None   Collection Time: 06/14/23  6:45 AM   Specimen: Anterior Nasal Swab  Result Value Ref Range Status   SARS Coronavirus 2 by RT PCR NEGATIVE NEGATIVE Final   Influenza A by PCR NEGATIVE NEGATIVE Final   Influenza B by PCR NEGATIVE NEGATIVE Final    Comment: (NOTE) The Xpert Xpress SARS-CoV-2/FLU/RSV plus assay is intended as an aid in the diagnosis of influenza from Nasopharyngeal swab specimens and should not be used as a sole basis for treatment. Nasal washings and aspirates are unacceptable for Xpert Xpress SARS-CoV-2/FLU/RSV testing.  Fact Sheet for Patients: BloggerCourse.com  Fact Sheet for Healthcare Providers: SeriousBroker.it  This test is not yet approved or cleared by the [place] FDA and has been authorized for detection and/or diagnosis of SARS-CoV-2 by FDA under an Emergency Use Authorization (EUA). This EUA will remain in effect (meaning this test can be used) for the duration of the COVID-19 declaration under Section 564(b)(1) of the Act, 21 U.S.C. section 360bbb-3(b)(1), unless the authorization is terminated or revoked.     Resp Syncytial Virus by PCR NEGATIVE NEGATIVE  Final    Comment: (NOTE) Fact Sheet for Patients: BloggerCourse.com  Fact Sheet for Healthcare Providers: SeriousBroker.it  This test is not yet approved or cleared by the [place] FDA and has been authorized for detection and/or diagnosis of SARS-CoV-2 by FDA under an Emergency Use Authorization (EUA). This EUA will remain in effect (meaning this test can be used) for the duration of the COVID-19 declaration under Section 564(b)(1) of the Act, 21 U.S.C. section 360bbb-3(b)(1), unless the authorization is terminated or revoked.  Performed at [facility], [address] [place], [place] [address]  MRSA Next Gen by PCR, Nasal     Status: None   Collection Time: 06/14/23  2:15 PM   Specimen: Nasal Mucosa; Nasal Swab  Result Value Ref Range Status   MRSA by PCR Next Gen NOT DETECTED NOT DETECTED Final    Comment: (NOTE) The GeneXpert MRSA Assay (FDA approved for NASAL specimens only), is one component of a comprehensive MRSA colonization surveillance program. It is not intended to diagnose MRSA infection nor to guide or monitor treatment for MRSA infections. Test performance is not FDA approved in patients less than 55 years old. Performed at [facility], [address] [place], [place] [address]          Radiology Studies: No results found.       Scheduled Meds:  amLODipine  5 mg Oral Daily   Chlorhexidine Gluconate Cloth  6 each Topical Daily   divalproex  250 mg Oral QID   feeding supplement  237 mL Oral BID BM   folic acid  1 mg Oral Daily   heparin  5,000 Units Subcutaneous Q8H   irbesartan  75 mg Oral Daily   mouth rinse  15 mL Mouth Rinse 4 times per day   pantoprazole  40 mg Oral Q1200   polyethylene glycol  17 g Oral BID   senna-docusate  2 tablet Oral BID   Continuous Infusions:  dextrose 150 mL/hr at 06/23/23 0600   levETIRAcetam Stopped (06/22/23 2213)          Glade Lloyd, MD [facility] 06/23/2023, 8:07 AM

## 2023-06-23 NOTE — Progress Notes (Signed)
 Hypoglycemic Event  CBG: 39  Treatment: 4 oz juice/soda and D50 50 mL (25 gm)  Symptoms: None  Follow-up CBG: Time: 0108 CBG Result:107  Possible Reasons for Event: Unknown  Comments/MD notified: Dr. Newton Pigg  Additional Info: Provider increased D10 rate from 125 to 150mL/hr, 1 time dose of D50 (25g) and PRN D50 order.  Jerold Coombe

## 2023-06-23 NOTE — Progress Notes (Addendum)
 Initial Nutrition Assessment  DOCUMENTATION CODES:   Severe malnutrition in context of social or environmental circumstances  INTERVENTION:   Continue Ensure Enlive po BID, each supplement provides 350 kcal and 20 grams of protein. Encourage caloric beverages such as juice.  Order [employer] with dinner.  MVI with minerals and folic acid.   NUTRITION DIAGNOSIS:   Severe Malnutrition related to social / environmental circumstances as evidenced by severe fat depletion, severe muscle depletion.  GOAL:   Patient will meet greater than or equal to 90% of their needs  MONITOR:   PO intake, Supplement acceptance  REASON FOR ASSESSMENT:   Consult Assessment of nutrition requirement/status  ASSESSMENT:   Pt admitted for stroke. 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  Intake: Nursing flowsheets document meal completion between 50-100%.   RD observed juice in room. Pt with some confusion answering RD interview questions. Pt able to tell RD she likes juice (any flavor) and seafood. Pt agreeable to trying Ensure ONS.   Labs reviewed. Episodes of hypoglycemia noted. BG ranging from 58-81. On D10 drip. Potassium 3.2 L- replaced. Magnesium 1.6 L.   Medications reviewed. Daily folic acid daily, protonix, D10 @ 150 mL/hr, dextrose 50% PRN, senna and Miralax.   NUTRITION - FOCUSED PHYSICAL EXAM:  Flowsheet Row Most Recent Value  Orbital Region Severe depletion  Upper Arm Region Severe depletion  Thoracic and Lumbar Region Severe depletion  Buccal Region Severe depletion  Temple Region Severe depletion  Clavicle Bone Region Severe depletion  Clavicle and Acromion Bone Region Severe depletion  Scapular Bone Region Severe depletion  Dorsal Hand Severe depletion  Patellar Region Severe depletion  Anterior Thigh Region Severe depletion  Posterior Calf Region Severe depletion   Edema (RD Assessment) Moderate  Hair Reviewed  Eyes Reviewed  Mouth Reviewed  Skin Reviewed  Nails Reviewed       Diet Order:   Diet Order             DIET - DYS 1 Fluid consistency: Thin  Diet effective now                    EDUCATION NEEDS:   Education needs have been addressed  Skin:  Skin Assessment: Reviewed RN Assessment  Last BM:  3/23 T6  Height:   Ht Readings from Last 1 Encounters:  06/14/23 5\' 3"  (1.6 m)    Weight:   Wt Readings from Last 1 Encounters:  06/21/23 45.5 kg    Ideal Body Weight:     BMI:  Body mass index is 17.77 kg/m.  Estimated Nutritional Needs:   Kcal:  1450-1700 kcal  Protein:  65-90 g  Fluid:  >/=1.5 L  Kathrynn Speed, MPH, RD, LDN Clinical Dietitian Contact information can be found at [facility].

## 2023-06-23 NOTE — Progress Notes (Signed)
 Hypoglycemic Event  CBG: 03/14/26  Treatment: 8 oz juice/soda and D50 50 mL (25 gm) given in addition to eating 100% of her breakfast. Patient had just finished eating all items on breakfast tray, drank ensure.   Symptoms: No symptoms noted; patient is alert, talking to RN, although is confused.  Follow-up CBG: BG Result: 82  Possible Reasons for Event: possible related to pancreatic mass.   Comments/MD notified: Dr. Hanley Ben made aware of.    Olivia Ross

## 2023-06-23 NOTE — Progress Notes (Signed)
 Patient again with low CBG this afternoon even after eating and on dextrose fluids. Patient is given amp of dextrose 50% as well. Dr. Hanley Ben made aware and receive new order for BMP. Patient has very poor vasculature which could be giving falsely low blood sugars when check peripherally despite warming up her extremity. Patient has been alert and talkative all day today.

## 2023-06-23 NOTE — Plan of Care (Signed)

## 2023-06-23 NOTE — Progress Notes (Signed)
 [facility] night cross cover note:   I was notified by pt's RN that this patient, whose hospital course has been complicated by persistent hypoglycemia, has a most recent CBG result in the 30s.  She is currently on D10 at 125 cc/h, with blood sugars throughout day shift noted to be in the range of 58-81.  Not associated with any hyperglycemic readings while on the D10.  Consequently, I have ordered an amp of D50 x 1 dose now, along with an order for prn amps of D50 moving forward for CBG less than 70, and have also increased the rate of her D10 from 125 cc/h to 150 cc/h.     Newton Pigg, DO Hospitalist

## 2023-06-24 ENCOUNTER — Other Ambulatory Visit: Payer: Self-pay

## 2023-06-24 DIAGNOSIS — R638 Other symptoms and signs concerning food and fluid intake: Secondary | ICD-10-CM

## 2023-06-24 DIAGNOSIS — Z789 Other specified health status: Secondary | ICD-10-CM

## 2023-06-24 DIAGNOSIS — Z515 Encounter for palliative care: Secondary | ICD-10-CM | POA: Diagnosis not present

## 2023-06-24 DIAGNOSIS — E876 Hypokalemia: Secondary | ICD-10-CM | POA: Diagnosis not present

## 2023-06-24 DIAGNOSIS — G40409 Other generalized epilepsy and epileptic syndromes, not intractable, without status epilepticus: Secondary | ICD-10-CM | POA: Diagnosis not present

## 2023-06-24 DIAGNOSIS — Z711 Person with feared health complaint in whom no diagnosis is made: Secondary | ICD-10-CM

## 2023-06-24 LAB — GLUCOSE, CAPILLARY
Glucose-Capillary: 125 mg/dL — ABNORMAL HIGH (ref 70–99)
Glucose-Capillary: 134 mg/dL — ABNORMAL HIGH (ref 70–99)
Glucose-Capillary: 193 mg/dL — ABNORMAL HIGH (ref 70–99)
Glucose-Capillary: 28 mg/dL — CL (ref 70–99)
Glucose-Capillary: 52 mg/dL — ABNORMAL LOW (ref 70–99)
Glucose-Capillary: 67 mg/dL — ABNORMAL LOW (ref 70–99)
Glucose-Capillary: 68 mg/dL — ABNORMAL LOW (ref 70–99)
Glucose-Capillary: 71 mg/dL (ref 70–99)
Glucose-Capillary: 84 mg/dL (ref 70–99)
Glucose-Capillary: 93 mg/dL (ref 70–99)

## 2023-06-24 LAB — BASIC METABOLIC PANEL
Anion gap: 7 (ref 5–15)
Anion gap: 11 (ref 5–15)
Anion gap: 7 (ref 5–15)
Anion gap: 10 (ref 5–15)
BUN: 14 mg/dL (ref 8–23)
BUN: 17 mg/dL (ref 8–23)
BUN: 17 mg/dL (ref 8–23)
BUN: 18 mg/dL (ref 8–23)
CO2: 23 mmol/L (ref 22–32)
CO2: 22 mmol/L (ref 22–32)
CO2: 24 mmol/L (ref 22–32)
CO2: 23 mmol/L (ref 22–32)
Calcium: 9.2 mg/dL (ref 8.9–10.3)
Calcium: 9.2 mg/dL (ref 8.9–10.3)
Calcium: 8.9 mg/dL (ref 8.9–10.3)
Calcium: 9.4 mg/dL (ref 8.9–10.3)
Chloride: 103 mmol/L (ref 98–111)
Chloride: 101 mmol/L (ref 98–111)
Chloride: 101 mmol/L (ref 98–111)
Chloride: 103 mmol/L (ref 98–111)
Creatinine, Ser: 0.58 mg/dL (ref 0.44–1.00)
Creatinine, Ser: 0.66 mg/dL (ref 0.44–1.00)
Creatinine, Ser: 0.54 mg/dL (ref 0.44–1.00)
Creatinine, Ser: 0.56 mg/dL (ref 0.44–1.00)
GFR, Estimated: >60 mL/min (ref >60)
GFR, Estimated: >60 mL/min (ref >60)
GFR, Estimated: >60 mL/min (ref >60)
GFR, Estimated: >60 mL/min (ref >60)
Glucose, Bld: 130 mg/dL — ABNORMAL HIGH (ref 70–99)
Glucose, Bld: 86 mg/dL (ref 70–99)
Glucose, Bld: 236 mg/dL — ABNORMAL HIGH (ref 70–99)
Glucose, Bld: 96 mg/dL (ref 70–99)
Potassium: 3.7 mmol/L (ref 3.5–5.1)
Potassium: 3.6 mmol/L (ref 3.5–5.1)
Potassium: 3.9 mmol/L (ref 3.5–5.1)
Potassium: 3.9 mmol/L (ref 3.5–5.1)
Sodium: 133 mmol/L — ABNORMAL LOW (ref 135–145)
Sodium: 134 mmol/L — ABNORMAL LOW (ref 135–145)
Sodium: 132 mmol/L — ABNORMAL LOW (ref 135–145)
Sodium: 136 mmol/L (ref 135–145)

## 2023-06-24 LAB — MAGNESIUM: Magnesium: 1.2 mg/dL — ABNORMAL LOW (ref 1.7–2.4)

## 2023-06-24 MED ORDER — MAGNESIUM SULFATE 4 GM/100ML IV SOLN
4.0000 g | Freq: Once | INTRAVENOUS | Status: AC
  Administered 2023-06-24: 4 g via INTRAVENOUS
  Filled 2023-06-24: qty 100

## 2023-06-24 MED ORDER — DEXTROSE 5 % IV SOLN
INTRAVENOUS | Status: AC
  Filled 2023-06-24: qty 1000

## 2023-06-24 MED ORDER — INFLUENZA VAC A&B SURF ANT ADJ 0.5 ML IM SUSY
0.5000 mL | PREFILLED_SYRINGE | INTRAMUSCULAR | Status: AC
  Administered 2023-06-25: 0.5 mL via INTRAMUSCULAR
  Filled 2023-06-24 (×2): qty 0.5

## 2023-06-24 NOTE — Plan of Care (Signed)

## 2023-06-24 NOTE — Progress Notes (Signed)
 Speech Language Pathology Treatment: Dysphagia  Patient Details Name: Olivia Ross MRN: [id number] DOB: [birth year] Today's Date: 06/24/2023 Time: 1206-1215 SLP Time Calculation (min) (ACUTE ONLY): 9 min  Assessment / Plan / Recommendation Clinical Impression  Pt's positioning was improved this date which appeared to improve alertness overall. No coughing or throat clearance was observed with trials of thin liquids. Mastication is significantly prolonged with diced solids (simulated Dys 2) and results in oral residue. Residuals are also present with pureed solids. Based on pt's daughter's report that pt mostly consumed a diet of very soft solids or purees PTA, suspect pt's performance is close to her baseline. Recommend continuing current diet of Dys 1 solids with thin liquids without ongoing SLP f/u. Mentation and missing dentition are anticipated to continue to affect pt's performance as fluctuations are possible. She may benefit from full supervision at meal times to ensure she achieves oral clearance and attends to POs. Will s/o acutely.    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  Family thought she may have UTI.  Patient initially alert then had a seizure en route to hospital.  EMS gave 5mg  IM Versed, but then she became apneic and she was intubated (Etomidate and Succinylcholine) in the ED.  She was diagnosed with hypoxic respiratory failure requiring intubation, seizure disorder, lactic acidosis and hypokalemia.  Most recent CT of the head was showing no acute findings with multiple chronic infarcts.      SLP Plan  All goals met      Recommendations for follow up therapy are one component of a multi-disciplinary discharge planning process, led by the attending physician.  Recommendations may be updated based on patient status, additional functional criteria and  insurance authorization.    Recommendations  Diet recommendations: Dysphagia 1 (puree);Thin liquid Liquids provided via: Cup;Straw Medication Administration: Crushed with puree Supervision: Full supervision/cueing for compensatory strategies Compensations: Slow rate;Small sips/bites Postural Changes and/or Swallow Maneuvers: Seated upright 90 degrees                  Oral care BID   Frequent or constant Supervision/Assistance Dysphagia, unspecified (R13.10)     All goals met     Gwynneth Aliment, M.A., CF-SLP Speech Language Pathology, Acute Rehabilitation Services  Secure Chat preferred [phone]   06/24/2023, 1:44 PM

## 2023-06-24 NOTE — Progress Notes (Signed)
 Daily Progress Note   Patient Name: Olivia Ross       Date: 06/24/2023 DOB: 11-30-1945  Age: 78 y.o. MRN#: [id number] Attending Physician: Glade Lloyd, MD Primary Care Physician: Stevphen Rochester, MD Admit Date: 06/14/2023  Reason for Consultation/Follow-up: Establishing goals of care  Subjective: I have reviewed medical records including EPIC notes, MAR, any available advanced directives as necessary, and labs. Patient with another hypoglycemic episode this morning. Received report from primary RN - no acute concerns. Per RN, patient is not eating/drinking to help correct hypoglycemia - had to administer IV dextrose.   Went to visit patient at bedside - no family/visitors present. Primary NT at bedside providing patient with a bath. No signs or non-verbal gestures of pain or discomfort noted. No respiratory distress, increased work of breathing, or secretions noted. Patient is awake, alert, disoriented, and unable to participate in meaningful conversation.  9:45 AM Attempted to call daughter/Stephanie for ongoing GOC and support - no answer - confidential voicemail left and PMT phone number provided with request to return call.   Length of Stay: 10  Current Medications: Scheduled Meds:   amLODipine  5 mg Oral Daily   Chlorhexidine Gluconate Cloth  6 each Topical Daily   divalproex  250 mg Oral QID   feeding supplement  237 mL Oral BID BM   folic acid  1 mg Oral Daily   heparin  5,000 Units Subcutaneous Q8H   irbesartan  75 mg Oral Daily   multivitamin with minerals  1 tablet Oral Daily   octreotide  100 mcg Subcutaneous Q8H   mouth rinse  15 mL Mouth Rinse 4 times per day   pantoprazole  40 mg Oral Q1200   polyethylene glycol  17 g Oral BID   senna-docusate  2 tablet Oral BID     Continuous Infusions:  dextrose      PRN Meds: dextrose, labetalol, mouth rinse, simethicone  Physical Exam Vitals and nursing note reviewed.  Constitutional:      General: She is not in acute distress.    Appearance: She is ill-appearing.  Pulmonary:     Effort: No respiratory distress.  Skin:    General: Skin is warm and dry.  Neurological:     Mental Status: She is alert. She is disoriented and confused.     Motor:  Weakness present.  Psychiatric:        Cognition and Memory: Cognition is impaired. Memory is impaired.             Vital Signs: BP (!) 127/58 (BP Location: Right Arm)   Pulse (!) 113   Temp 98.1 F (36.7 C) (Oral)   Resp 17   Ht 5\' 3"  (1.6 m)   Wt 45.5 kg   SpO2 98%   BMI 17.77 kg/m  SpO2: SpO2: 98 % O2 Device: O2 Device: Room Air O2 Flow Rate: O2 Flow Rate (L/min): 1 L/min  Intake/output summary:  Intake/Output Summary (Last 24 hours) at 06/24/2023 0936 Last data filed at 06/24/2023 0411 Gross per 24 hour  Intake 1957.21 ml  Output 2650 ml  Net -692.79 ml   LBM: Last BM Date : 06/22/23 Baseline Weight: Weight: 41.6 kg Most recent weight: Weight: 45.5 kg       Palliative Assessment/Data: PPS 30%      Patient Active Problem List   Diagnosis Date Noted   Seizure disorder, 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  She also had a seizure en route to the hospital and was treated accordingly. PMT has been consulted to assist with goals of care conversation.  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 Will continue attempts at contact Per previous discussions, daughter has expressed desire for continued aggressive interventions PMT will continue to follow and support holistically  Goals of Care and Additional Recommendations: Limitations on Scope of Treatment: Full Scope Treatment  Code Status:    Code Status Orders  (From admission, onward)           Start     Ordered   06/14/23 0631  Full code  Continuous       Question:  By:  Answer:  Default: patient does not have capacity for decision making, no surrogate or prior directive available   06/14/23 0633           Code Status History     Date Active Date Inactive Code Status Order ID Comments User Context   02/03/2022 1140 02/05/2022 1850 Full Code [id number]  Jonah Blue, MD ED   08/27/2021 0713 08/29/2021 0134 Full Code [id number]  Carollee Herter, DO ED   02/03/2021 1039 02/05/2021 2118  Full Code [id number]  Bobette Mo, MD Inpatient   12/20/2020 1742 12/22/2020 2240 Full Code [id number]  Emeline General, MD Inpatient       Prognosis:  Poor  Discharge Planning: To Be Determined  Care plan was  discussed with primary RN  Thank you for allowing the Palliative Medicine Team to assist in the care of this patient.   Total Time 35 minutes Prolonged Time Billed  no       Haskel Khan, NP  Please contact Palliative Medicine Team phone at [phone] for questions and concerns.   *Portions of this note are a verbal dictation therefore any spelling and/or grammatical errors are due to the "[employer]agon [employer] One" system interpretation.

## 2023-06-24 NOTE — Progress Notes (Addendum)
 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  History of seizures.  Patient initially alert but had a seizure and out of the hospital.  She subsequently required intubation in the ED and admission to ICU.  Neurology was consulted.  She was started on Keppra.  She was subsequently extubated.  LTM EEG negative for seizures.  Palliative care consulted for goals of care discussion.  MRI brain showed no acute stroke but showed old strokes.  Neurology signed off on 06/16/2023 and recommended outpatient follow-up with neurology.  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  She was transferred to [facility] service from 06/16/2023 onwards.  Assessment & Plan:   Seizure -Symptoms possibly due to seizure activity. -Neurology evaluation and follow-up appreciated.  LTM EEG negative and MRI of brain did not show any acute findings. -Currently on Keppra.  After discussion with patient's daughter on 06/20/2023 on phone, she was very concerned that patient was very sleepy may be from Keppra.  I communicated the same with neurology/Dr. Otelia Limes: Patient has been started on valproic acid.  Keppra has been subsequently discontinued by neurology. 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   -MRI of abdomen showed a 2.2 x 2.1 cm lesion in the pancreatic head raising concern for neoplasm. -Because of patient's dementia and other comorbidities, patient is not a candidate for any treatments and may not truly benefit from procedures such as biopsy.  Daughter agrees about the same.  Goals of  care -Palliative care following.  Currently remains full code. -Overall prognosis is guarded to poor. -Patient has had persistent hypoglycemia despite D10 drip with not much oral intake and improvement in clinical condition.  I recommend that family consider home/residential hospice and comfort measures. -I had a long discussion with daughter on phone on 06/20/2023: She wants her mother to have a decent quality and is hopeful that she remains more awake and eats better.  She was still not decided about CODE STATUS and wanted to think about it and talk to her primary care provider.  Hypoglycemia -From poor oral intake.  -C-peptide slightly elevated. -Sandostatin started on 06/23/2023.  CBGs continue to be low but reliability of CBGs is questionable given poor vasculature.  BMP glucose are better. Will check BMP later today and in the evening. Will switch D10 to D5 drip from today.  Encourage oral intake.  Hypokalemia--improved  Hypomagnesemia -Replace.  Repeat a.m. labs  Hyponatremia -Mild.  Monitor  Constipation -Continue bowel regimen  History of vascular dementia -Fall precautions.  PT/OT recommending home health PT/OT  Anemia of chronic disease -From chronic illnesses.  Hemoglobin currently stable.  Monitor intermittently  Thrombocytopenia -No signs of bleeding.  Monitor intermittently  Oropharyngeal dysphagia -Diet as per SLP recommendations  Elevated LFTs -Mildly elevated.  Monitor intermittently.  Severe malnutrition  poor oral intake -Follow dietitian recommendations  DVT prophylaxis: Subcutaneous heparin Code Status: Full Family Communication: Spoke to daughter on phone on 06/23/2023 disposition Plan: Status is: Inpatient Remains inpatient appropriate because: Of severity of illness   Consultants: Neurology/PCCM/palliative care  Procedures: As above  Antimicrobials: None   Subjective: Patient seen and examined at bedside.  Poor historian.  No fever,  vomiting, agitation reported. Objective: Vitals:   06/23/23 1809 06/23/23 1925 06/24/23 0001 06/24/23 0406  BP: 133/66 128/63 134/66 128/71  Pulse:  88 95 100  Resp: 18 17 16 13   Temp: 97.9 F (36.6 C) 98.1 F (36.7 C) 98.6 F (37 C) 98.1 F (36.7 C)  TempSrc: Axillary Oral Oral Oral  SpO2: 100% 100% 100% 100%  Weight:      Height:        Intake/Output Summary (Last 24 hours) at 06/24/2023 0803 Last data filed at 06/24/2023 0411 Gross per 24 hour  Intake 2077.21 ml  Output 2650 ml  Net -572.79 ml   Filed Weights   06/18/23 0422 06/19/23 0241 06/21/23 0308  Weight: 39.5 kg 43 kg 45.5 kg    Examination:  General: No acute distress and remains on room air.  Chronically ill and deconditioned looking.  Extremely thinly built. ENT/neck: No obvious neck masses or elevated JVD noted respiratory: Bilateral decreased breath sounds at bases with no wheezing CVS: Mild intermittent tachycardia present; S1 and S2 are heard  abdominal: Soft, nontender, mildly distended; no organomegaly, bowel sounds are heard  extremities: No clubbing or cyanosis noted  CNS: Awake, still extremely slow to respond, confused.  No obvious focal deficits noted lymph: No palpable lymphadenopathy noted Skin: No obvious petechiae/rashes psych: Extremely flat affect.  Currently not agitated musculoskeletal: No obvious joint tenderness/erythema  Data Reviewed: I have personally reviewed following labs and imaging studies  CBC: Recent Labs  Lab 06/18/23 0325 06/20/23 0405 06/21/23 0322  WBC 5.3 7.3 6.0  NEUTROABS  --  4.8 3.9  HGB 11.3* 10.2* 8.1*  HCT 33.5* 31.4* 24.6*  MCV 98.0 100.6* 101.2*  PLT 136* 116* 119*   Basic Metabolic Panel: Recent Labs  Lab 06/18/23 0325 06/19/23 0328 06/20/23 0405 06/21/23 0322 06/22/23 0321 06/23/23 1121 06/23/23 1438 06/23/23 1947 06/23/23 2312 06/24/23 0341  NA 136   < > 137 137 140 137 137 135 134* 133*  K 3.9   < > 4.7 3.8 3.2* 3.7 3.9 3.7 3.9 3.7  CL  103   < > 102 106 105 106 105 104 102 103  CO2 27   < > 26 25 24  20* 24 21* 24 23  GLUCOSE 102*   < > 93 126* 89 114* 145* 170* 94 130*  BUN 12   < > 15 15 14 15 14 14 16 14   CREATININE 0.60   < > 0.53 0.68 0.49 0.53 0.54 0.56 0.61 0.58  CALCIUM 9.2   < > 9.5 9.2 9.3 9.0 9.3 9.1 9.5 9.2  MG 2.2   < > 1.6* 1.7 1.5* 1.4*  --   --   --  1.2*  PHOS 1.3*  --   --   --   --   --   --   --   --   --    < > = values in this interval not displayed.   GFR: Estimated Creatinine Clearance: 41 mL/min (by C-G formula based on SCr of 0.58 mg/dL). Liver Function Tests: Recent Labs  Lab 06/18/23 0325 06/20/23 1438 06/21/23 0322  AST 46* 45* 62*  ALT 41 46* 59*  ALKPHOS 98 73 80  BILITOT 0.4 0.8 0.6  PROT 5.0* 4.8* 4.5*  ALBUMIN 2.6* 2.5* 2.2*   No results for input(s): "LIPASE", "AMYLASE" in the last 168 hours.  Recent Labs  Lab 06/20/23 1438  AMMONIA 15   Coagulation Profile: No results for input(s): "INR", "PROTIME" in the last 168 hours.  Cardiac Enzymes: No results for input(s): "CKTOTAL", "CKMB", "CKMBINDEX", "TROPONINI" in the last 168 hours. BNP (last 3 results) No results for input(s): "PROBNP" in the last 8760 hours. HbA1C: No results for input(s): "HGBA1C" in the last 72 hours. CBG: Recent Labs  Lab 06/24/23 0001 06/24/23 0134 06/24/23 0233 06/24/23 0405 06/24/23 0737  GLUCAP 52* 193* 125* 71 67*   Lipid Profile: No results for input(s): "CHOL", "HDL", "LDLCALC", "TRIG", "CHOLHDL", "LDLDIRECT" in the last 72 hours. Thyroid Function Tests: No results for input(s): "TSH", "T4TOTAL", "FREET4", "T3FREE", "THYROIDAB" in the last 72 hours.  Anemia Panel: No results for input(s): "VITAMINB12", "FOLATE", "FERRITIN", "TIBC", "IRON", "RETICCTPCT" in the last 72 hours. Sepsis Labs: No results for input(s): "PROCALCITON", "LATICACIDVEN" in the last 168 hours.   Recent Results (from the past 240 hours)  MRSA Next Gen by PCR, Nasal     Status: None   Collection Time: 06/14/23   2:15 PM   Specimen: Nasal Mucosa; Nasal Swab  Result Value Ref Range Status   MRSA by PCR Next Gen NOT DETECTED NOT DETECTED Final    Comment: (NOTE) The GeneXpert MRSA Assay (FDA approved for NASAL specimens only), is one component of a comprehensive MRSA colonization surveillance program. It is not intended to diagnose MRSA infection nor to guide or monitor treatment for MRSA infections. Test performance is not FDA approved in patients less than 55 years old. Performed at [facility], [address] [place], [place] [address]          Radiology Studies: No results found.       Scheduled Meds:  amLODipine  5 mg Oral Daily   Chlorhexidine Gluconate Cloth  6 each Topical Daily   divalproex  250 mg Oral QID   feeding supplement  237 mL Oral BID BM   folic acid  1 mg Oral Daily   heparin  5,000 Units Subcutaneous Q8H   irbesartan  75 mg Oral Daily   multivitamin with minerals  1 tablet Oral Daily   octreotide  100 mcg Subcutaneous Q8H   mouth rinse  15 mL Mouth Rinse 4 times per day   pantoprazole  40 mg Oral Q1200   polyethylene glycol  17 g Oral BID   senna-docusate  2 tablet Oral BID   Continuous Infusions:  dextrose 150 mL/hr at 06/23/23 1700          Benedetto Ryder Hanley Ben, MD [facility] 06/24/2023, 8:03 AM

## 2023-06-24 NOTE — Progress Notes (Signed)
 Patient's CBG was 67at 0737, patient was alert and talking with the RN, no any symptoms noted, PRN D50 50ml and simple carbs provided.  Patient should get D10 infusion continuously but due to unavailability, infusion couldn't be continued, MD and pharmacy aware.

## 2023-06-24 NOTE — Progress Notes (Signed)
 I relayed daughters concern about fluctuation of  CBG values To MD Alekh. He confirmed plan was to draw serum glucose Q4H and D/C finger sticks. This information was relayed to daughter of patient.

## 2023-06-24 NOTE — TOC Progression Note (Signed)
 Transition of Care (TOC) - Progression Note  Donn Pierini RN, BSN Transitions of Care Unit 4E- RN Case Manager See Treatment Team for direct phone #   Patient Details  Name: Olivia Ross MRN: [id number] Date of Birth: [birth year]  Transition of Care [facility]) CM/SW Contact  Zenda Alpers, Lenn Sink, RN Phone Number: 06/24/2023, 9:39 AM  Clinical Narrative:    Moreen Fowler from daughter [name] regarding patients PCP contact info-  PCP is at [facility]- phone # [phone].  Call made to the number provided and VM left regarding the question about HH needs to see if [facility] has a partnership with any [facility] provider. Daughter would prefer to use [facility]/ [facility] if there is a partnership if not then she voiced she is ok with using [facility] again. Return call pending from PCP.   [employer] liaison reached out as well regarding DME hospital bed- requested modification of narrative for bed- which will also need MD to sign. Per liaison bed will not be delivered to the home until pt within 48hr window of discharge per insurance rules. [employer] to follow.     Expected Discharge Plan: Home w Home Health Services Barriers to Discharge: Continued Medical Work up  Expected Discharge Plan and Services   Discharge Planning Services: CM Consult Post Acute Care Choice: Durable Medical Equipment, Home Health Living arrangements for the past 2 months: Single Family Home                 DME Arranged: Hospital bed DME Agency: [facility] Date DME Agency Contacted: 06/20/23 Time DME Agency Contacted: 1216 Representative spoke with at DME Agency: Ian Malkin HH Arranged: PT, OT           Social Determinants of Health (SDOH) Interventions SDOH Screenings   Food Insecurity: Patient Unable To Answer (06/14/2023)  Housing: Patient Unable To Answer (06/14/2023)  Transportation Needs: Patient Unable To Answer (06/14/2023)  Recent Concern: Transportation Needs - Unmet Transportation Needs (04/25/2023)    Received from [facility]  Utilities: Patient Unable To Answer (06/14/2023)  Financial Resource Strain: Low Risk  (04/25/2023)   Received from [facility]  Physical Activity: Unknown (04/25/2023)   Received from [facility]  Social Connections: Patient Unable To Answer (06/14/2023)  Stress: No Stress Concern Present (04/25/2023)   Received from [facility]  Tobacco Use: Medium Risk (06/14/2023)    Readmission Risk Interventions     No data to display

## 2023-06-24 NOTE — Progress Notes (Signed)
 Physical Therapy Treatment Patient Details Name: Olivia Ross MRN: [id number] DOB: [birth year] Today's Date: 06/24/2023   History of Present Illness 78 y.o. female presents to ED 06/14/23 for Code Stroke, left gaze and aphasic, questionable seizure. EEG showing potential moderate diffuse encephalopathy with no apparent seizure activity.  Intubated <24 hours  PMH: dementia, HTN, right breast cancer, R MCA CVA, L MCA CVA    PT Comments  Pt resting in bed on arrival, awake and alert, making small conversation with this PTA. Pt continues to demonstrate slow progress towards acute goals limited by poor command following, global weakness and decreased activity tolerance.  Pt requiring total A to compete bed mobility and mod A to maintain sitting EOB. RN in room to check glucose while pt seated up EOB, glucose level 28 mg/dL (409 mg/dL prior to sessio), session terminated and pt returned to supine with total A, RN present at EOS. Pt continues to benefit from skilled PT services to progress toward functional mobility goals.      If plan is discharge home, recommend the following: A lot of help with walking and/or transfers;A lot of help with bathing/dressing/bathroom;Assistance with cooking/housework;Assist for transportation;Help with stairs or ramp for entrance;Supervision due to cognitive status   Can travel by private vehicle        Equipment Recommendations  Hospital bed    Recommendations for Other Services       Precautions / Restrictions Precautions Precautions: Fall Precaution/Restrictions Comments: multiple bony prominences--currently covered wtih Mepilix, watch glucose levels Restrictions Weight Bearing Restrictions Per Provider Order: No     Mobility  Bed Mobility Overal bed mobility: Needs Assistance Bed Mobility: Sit to Supine, Supine to Sit     Supine to sit: Total assist Sit to supine: Total assist   General bed mobility comments: total A for all aspects as pt with  limtied command following    Transfers                   General transfer comment: not attempted due to low glucose level    Ambulation/Gait                   Stairs             Wheelchair Mobility     Tilt Bed    Modified Rankin (Stroke Patients Only)       Balance Overall balance assessment: Needs assistance Sitting-balance support: Feet supported, No upper extremity supported Sitting balance-Leahy Scale: Poor Sitting balance - Comments: able to maintiain statically without assist, R lean and LOB with fatigue pt keeping bil hands on lap                                    Communication Communication Communication:  (dementia)  Cognition Arousal: Alert Behavior During Therapy: Flat affect   PT - Cognitive impairments: History of cognitive impairments                       PT - Cognition Comments: limited command following this sessionm pt reposnding appropriately to simple conversation Following commands: Impaired Following commands impaired: Follows one step commands inconsistently, Follows one step commands with increased time    Cueing Cueing Techniques: Verbal cues, Gestural cues, Visual cues  Exercises      General Comments General comments (skin integrity, edema, etc.): RN in room during session to check glucose level, prior  to session 134, seated up EOB glucose 28, session terminated and pt returned to supine with RN in room to administer dextrose      Pertinent Vitals/Pain Pain Assessment Pain Assessment: Faces Faces Pain Scale: No hurt Pain Intervention(s): Monitored during session    Home Living                          Prior Function            PT Goals (current goals can now be found in the care plan section) Acute Rehab PT Goals Patient Stated Goal: none stated PT Goal Formulation: Patient unable to participate in goal setting Time For Goal Achievement: 07/01/23 Progress towards  PT goals: Not progressing toward goals - comment    Frequency    Min 1X/week      PT Plan      Co-evaluation              AM-PAC PT "6 Clicks" Mobility   Outcome Measure  Help needed turning from your back to your side while in a flat bed without using bedrails?: Total Help needed moving from lying on your back to sitting on the side of a flat bed without using bedrails?: Total Help needed moving to and from a bed to a chair (including a wheelchair)?: Total Help needed standing up from a chair using your arms (e.g., wheelchair or bedside chair)?: Total Help needed to walk in hospital room?: Total Help needed climbing 3-5 steps with a railing? : Total 6 Click Score: 6    End of Session   Activity Tolerance: Treatment limited secondary to medical complications (Comment) (low glucose level) Patient left: with call bell/phone within reach;in bed;with nursing/sitter in room;with bed alarm set Nurse Communication: Mobility status PT Visit Diagnosis: Muscle weakness (generalized) (M62.81)     Time: 0981-1914 PT Time Calculation (min) (ACUTE ONLY): 13 min  Charges:    $Therapeutic Activity: 8-22 mins PT General Charges $$ ACUTE PT VISIT: 1 Visit                     Tobi Bastos R. PTA Acute Rehabilitation Services Office: [phone]   Catalina Antigua 06/24/2023, 4:13 PM

## 2023-06-24 NOTE — Care Management Important Message (Signed)
 Important Message  Patient Details  Name: Olivia Ross MRN: [id number] Date of Birth: Mar 10, 1946   Important Message Given:  Yes - Medicare IM     Renie Ora 06/24/2023, 11:00 AM

## 2023-06-24 NOTE — Progress Notes (Signed)
 Per pharmacist is OK to get Flu vaccine Pharmacist reviewed allergies and patiet history and saw patient have received vaccine in the past.

## 2023-06-24 NOTE — Progress Notes (Signed)
 Patient is alert and oriented to self, remained sleepy throughout the day, assisted for feeding, had only small % of her meals, fluids offered in between, IVF continue as per order.  Checked CBG as per the order and corrected hypoglycemia as per the order and rechecked.  Patient was not symptomatic for low CBG and reading was not compatible with Sr. Glucose so, finger stick CBG check d/c by MD.  Skin care done, repositioned frequently, daughter was at bedside. All needs met.

## 2023-06-25 DIAGNOSIS — Z515 Encounter for palliative care: Secondary | ICD-10-CM | POA: Diagnosis not present

## 2023-06-25 DIAGNOSIS — G40409 Other generalized epilepsy and epileptic syndromes, not intractable, without status epilepticus: Secondary | ICD-10-CM | POA: Diagnosis not present

## 2023-06-25 DIAGNOSIS — Z711 Person with feared health complaint in whom no diagnosis is made: Secondary | ICD-10-CM

## 2023-06-25 DIAGNOSIS — E876 Hypokalemia: Secondary | ICD-10-CM | POA: Diagnosis not present

## 2023-06-25 DIAGNOSIS — R638 Other symptoms and signs concerning food and fluid intake: Secondary | ICD-10-CM

## 2023-06-25 DIAGNOSIS — Z789 Other specified health status: Secondary | ICD-10-CM

## 2023-06-25 DIAGNOSIS — R531 Weakness: Secondary | ICD-10-CM

## 2023-06-25 LAB — BASIC METABOLIC PANEL
Anion gap: 5 (ref 5–15)
BUN: 18 mg/dL (ref 8–23)
CO2: 29 mmol/L (ref 22–32)
Calcium: 8.8 mg/dL — ABNORMAL LOW (ref 8.9–10.3)
Chloride: 101 mmol/L (ref 98–111)
Creatinine, Ser: 0.5 mg/dL (ref 0.44–1.00)
GFR, Estimated: >60 mL/min (ref >60)
Glucose, Bld: 197 mg/dL — ABNORMAL HIGH (ref 70–99)
Potassium: 4.1 mmol/L (ref 3.5–5.1)
Sodium: 135 mmol/L (ref 135–145)

## 2023-06-25 LAB — CBC WITH DIFFERENTIAL/PLATELET
Abs Immature Granulocytes: 0.04 10*3/uL (ref 0.00–0.07)
Basophils Absolute: 0 10*3/uL (ref 0.0–0.1)
Basophils Relative: 0 %
Eosinophils Absolute: 0.1 10*3/uL (ref 0.0–0.5)
Eosinophils Relative: 1 %
HCT: 23.5 % — ABNORMAL LOW (ref 36.0–46.0)
Hemoglobin: 7.7 g/dL — ABNORMAL LOW (ref 12.0–15.0)
Immature Granulocytes: 1 %
Lymphocytes Relative: 20 %
Lymphs Abs: 1 10*3/uL (ref 0.7–4.0)
MCH: 33.2 pg (ref 26.0–34.0)
MCHC: 32.8 g/dL (ref 30.0–36.0)
MCV: 101.3 fL — ABNORMAL HIGH (ref 80.0–100.0)
Monocytes Absolute: 0.4 10*3/uL (ref 0.1–1.0)
Monocytes Relative: 7 %
Neutro Abs: 3.6 10*3/uL (ref 1.7–7.7)
Neutrophils Relative %: 71 %
Platelets: 94 10*3/uL — ABNORMAL LOW (ref 150–400)
RBC: 2.32 MIL/uL — ABNORMAL LOW (ref 3.87–5.11)
RDW: 15.7 % — ABNORMAL HIGH (ref 11.5–15.5)
WBC: 5 10*3/uL (ref 4.0–10.5)
nRBC: 0 % (ref 0.0–0.2)

## 2023-06-25 LAB — GLUCOSE, CAPILLARY
Glucose-Capillary: 107 mg/dL — ABNORMAL HIGH (ref 70–99)
Glucose-Capillary: 142 mg/dL — ABNORMAL HIGH (ref 70–99)
Glucose-Capillary: 130 mg/dL — ABNORMAL HIGH (ref 70–99)
Glucose-Capillary: 99 mg/dL (ref 70–99)

## 2023-06-25 LAB — MAGNESIUM: Magnesium: 1.6 mg/dL — ABNORMAL LOW (ref 1.7–2.4)

## 2023-06-25 MED ORDER — DEXTROSE-SODIUM CHLORIDE 5-0.45 % IV SOLN: INTRAVENOUS | Status: AC

## 2023-06-25 MED ORDER — MAGNESIUM SULFATE 4 GM/100ML IV SOLN
4.0000 g | Freq: Once | INTRAVENOUS | Status: AC
  Administered 2023-06-25: 4 g via INTRAVENOUS
  Filled 2023-06-25: qty 100

## 2023-06-25 NOTE — Progress Notes (Signed)
 Occupational Therapy Treatment Patient Details Name: Olivia Ross MRN: [id number] DOB: [birth year] Today's Date: 06/25/2023   History of present illness 78 y.o. female presents to ED 06/14/23 for Code Stroke, left gaze and aphasic, questionable seizure. EEG showing potential moderate diffuse encephalopathy with no apparent seizure activity.  Intubated <24 hours  PMH: dementia, HTN, right breast cancer, R MCA CVA, L MCA CVA   OT comments  Patient seen this am to attempt to address self feeding. Patient found to have soiled self with loose stool and performed rolling with max assist and total assist for cleaning. Patient yelling out for her mother when rolling. Patient's palm guard removed on Right hand and hand hygiene performed before re-donning with no signs of skin breakdown noted. Patient lethargic during visit and unable to address self feeding. Patient positioned for comfort. Acute OT to continue to follow to address established goals to facilitate DC to next venue of care.       If plan is discharge home, recommend the following:  Two people to help with walking and/or transfers;A lot of help with bathing/dressing/bathroom;Assistance with cooking/housework;Assistance with feeding;Assist for transportation;Help with stairs or ramp for entrance;Direct supervision/assist for financial management;Direct supervision/assist for medications management   Equipment Recommendations  Hospital bed    Recommendations for Other Services      Precautions / Restrictions Precautions Precautions: Fall Precaution/Restrictions Comments: multiple bony prominences--currently covered wtih Mepilix, watch glucose levels Restrictions Weight Bearing Restrictions Per Provider Order: No       Mobility Bed Mobility Overal bed mobility: Needs Assistance Bed Mobility: Rolling Rolling: Max assist, Total assist         General bed mobility comments: rolling performed to address cleaning    Transfers                    General transfer comment: not attempted     Balance                                           ADL either performed or assessed with clinical judgement   ADL Overall ADL's : Needs assistance/impaired     Grooming: Wash/dry hands;Total assistance;Bed level Grooming Details (indicate cue type and reason): hand hygiene following removal of pam guard on right hand     Lower Body Bathing: Total assistance;Bed level Lower Body Bathing Details (indicate cue type and reason): assistance with cleaning following bowel incontinence                       General ADL Comments: patient found to have soiled bed and assisted with cleaning. Unable to address self feeding due to level of arousal    Extremity/Trunk Assessment              Vision       Perception     Praxis     Communication Communication Communication:  (dementia)   Cognition Arousal: Lethargic Behavior During Therapy: Flat affect Cognition: History of cognitive impairments             OT - Cognition Comments: yelling out for her mother during treatment                 Following commands: Impaired Following commands impaired: Follows one step commands inconsistently      Cueing   Cueing Techniques: Verbal cues, Gestural cues, Tactile cues  Exercises      Shoulder Instructions       General Comments right palm guard removed with no signs of skin breakdown and hand hygiene peroformed before redonning    Pertinent Vitals/ Pain       Pain Assessment Pain Assessment: Faces Faces Pain Scale: Hurts little more Pain Location: right hand when removing and donning palm guard and during hand hygiene Pain Descriptors / Indicators: Moaning, Grimacing, Guarding Pain Intervention(s): Monitored during session  Home Living                                          Prior Functioning/Environment              Frequency  Min 1X/week         Progress Toward Goals  OT Goals(current goals can now be found in the care plan section)  Progress towards OT goals: Progressing toward goals  Acute Rehab OT Goals Patient Stated Goal: none stated OT Goal Formulation: Patient unable to participate in goal setting Time For Goal Achievement: 07/08/23 Potential to Achieve Goals: Fair ADL Goals Pt Will Perform Eating: with set-up;with supervision (supported sitting) Pt/caregiver will Perform Home Exercise Program: Increased strength;Left upper extremity;With written HEP provided (dominant/functional arm since CVA) Additional ADL Goal #1: Pt will be S up to EOB in prep for OOB Additional ADL Goal #2: Look at possible palm guard for right hand  Plan      Co-evaluation                 AM-PAC OT "6 Clicks" Daily Activity     Outcome Measure   Help from another person eating meals?: A Lot Help from another person taking care of personal grooming?: A Lot Help from another person toileting, which includes using toliet, bedpan, or urinal?: Total Help from another person bathing (including washing, rinsing, drying)?: Total Help from another person to put on and taking off regular upper body clothing?: Total Help from another person to put on and taking off regular lower body clothing?: Total 6 Click Score: 8    End of Session Equipment Utilized During Treatment: Other (comment) (right palm guard)  OT Visit Diagnosis: Other abnormalities of gait and mobility (R26.89);Muscle weakness (generalized) (M62.81);Hemiplegia and hemiparesis;Pain Hemiplegia - Right/Left: Right Hemiplegia - dominant/non-dominant: Dominant Hemiplegia - caused by: Cerebral infarction Pain - Right/Left: Right Pain - part of body: Hand (when removing and donning palm guard)   Activity Tolerance Patient tolerated treatment well   Patient Left in bed;with call bell/phone within reach;with bed alarm set   Nurse Communication Mobility status         Time: 1610-9604 OT Time Calculation (min): 23 min  Charges: OT General Charges $OT Visit: 1 Visit OT Treatments $Self Care/Home Management : 8-22 mins $Therapeutic Activity: 8-22 mins  Alfonse Flavors, OTA Acute Rehabilitation Services  Office [phone]   Dewain Penning 06/25/2023, 2:45 PM

## 2023-06-25 NOTE — Progress Notes (Signed)
 PROGRESS NOTE    Olivia Ross  ZOX:[id number] DOB: 1946/02/20 DOA: 06/14/2023 PCP: Stevphen Rochester, MD   Brief Narrative: Olivia Ross is a 78 y.o. female with a history of vascular dementia, multiple strokes, hypertension, hyperlipidemia.  Patient presented secondary to code stroke with left gaze deviation aphasia and possible seizures.  Neurology was consulted on admission and started on Keppra.  Patient required intubation and ICU admission on admission with extubation same day.  MRI brain negative for acute stroke.  Hospitalization complicated by ongoing poor oral intake with ongoing goals of care discussions.   Assessment and Plan:  Seizure Neurology consulted.  LTM EEG was negative for ongoing seizures and MRI was negative for acute processes to explain seizures.  Patient was initially managed on Keppra but was transitioned to valproic acid secondary to concern of lethargy. 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  Findings discussed with family with recommendations and decision to not pursue ongoing workup or to pursue management secondary to patient's underlying comorbidities.  Palliative care consulted for ongoing goals of care.  Poor oral intake Unclear if this is from acute illness versus related to progressive worsening in setting of multiple chronic illnesses and underlying dementia.  Family is concerned that medication effect could be worsening mental status.  Medications adjusted to minimize effect on mental status.  Palliative care medicine also consulted for help with goals of care discussions.  At this time, focus is for quality of life, however ongoing goals of care discussions are still being pursued. -Continue IV  fluids  Acute respiratory failure with hypoxia Patient required intubation on admission but was quickly extubated same day.  Currently on room air.  Respiratory failure resolved.  Hypoglycemia Likely related to poor oral intake in addition to underlying poor nutrition with notable cachectic body habitus with associated underweight BMI.  Patient managed with D50 in addition to D5 IV fluids. -Continue D5 IV fluids  Hypokalemia Resolved with repletion  Hypomagnesemia Magnesium of 1.6 today -Magnesium supplementation -Repeat magnesium in a.m. major  Vascular dementia Noted.  Complicating current prognosis.  Anemia of chronic disease Hemoglobin of 12.2 on admission with immediate downtrend to of 9.8.  Hemoglobin has been stable around 9-10 with acute drop to 7.7 this a.m.  No obvious evidence of bleeding at this time. -CBC in a.m.  Thrombocytopenia Unclear etiology.  Mostly stable.  Oropharyngeal dysphagia Patient evaluated by speech therapy. -Speech therapy recommendations (3/21): Diet recommendations: Dysphagia 1 (puree);Thin liquid Liquids provided via: Cup;Straw Medication Administration: Crushed with puree Supervision: Full supervision/cueing for compensatory strategies Compensations: Slow rate;Small sips/bites Postural Changes and/or Swallow Maneuvers: Seated upright 90 degrees  Elevated AST/ALT Mild.  MRI of the abdomen without evidence of hepatic injury/disease.  No obvious biliary obstruction.  Normal bilirubin.  Severe malnutrition -Dietitian recommendations (3/24): Continue Ensure Enlive po BID, each supplement provides 350 kcal and 20 grams of protein. Encourage caloric beverages such as juice.  Order [employer] with dinner.  MVI with minerals and folic acid.   Underweight Estimated body mass index is 17.77 kg/m as calculated from the following:   Height as of this encounter: 5\' 3"  (1.6 m).   Weight as of this encounter: 45.5 kg.    DVT prophylaxis:  Subcutaneous heparin Code Status:  Code Status: Full Code Family Communication: None at bedside.  Called daughter via telephone which went straight to voicemail. Disposition Plan: Discharge pending ongoing goals of care discussion/recommendations/plans   Consultants:  Neurology Palliative care medicine  Procedures:  LTM EEG Intubation/extubation  Antimicrobials:     Subjective: Patient without specific concerns but does not answer my questions.  Objective: BP 101/60 (BP Location: Left Arm)   Pulse 79   Temp 97.6 F (36.4 C) (Axillary)   Resp 13   Ht 5\' 3"  (1.6 m)   Wt 45.5 kg   SpO2 100%   BMI 17.77 kg/m   Examination:  General exam: Cachectic appearing.  Chronically ill-appearing. Respiratory system: Clear to auscultation. Respiratory effort normal. Cardiovascular system: S1 & S2 heard, RRR.  Gastrointestinal system: Abdomen is nondistended, soft and nontender. Normal bowel sounds heard. Central nervous system: Alert.  Does not answer orientation questions. Musculoskeletal: No edema. No calf tenderness  Data Reviewed: I have personally reviewed following labs and imaging studies  CBC Lab Results  Component Value Date   WBC 5.0 06/25/2023   RBC 2.32 (L) 06/25/2023   HGB 7.7 (L) 06/25/2023   HCT 23.5 (L) 06/25/2023   MCV 101.3 (H) 06/25/2023   MCH 33.2 06/25/2023   PLT 94 (L) 06/25/2023   MCHC 32.8 06/25/2023   RDW 15.7 (H) 06/25/2023   LYMPHSABS 1.0 06/25/2023   MONOABS 0.4 06/25/2023   EOSABS 0.1 06/25/2023   BASOSABS 0.0 06/25/2023     Last metabolic panel Lab Results  Component Value Date   NA 135 06/25/2023   K 4.1 06/25/2023   CL 101 06/25/2023   CO2 29 06/25/2023   BUN 18 06/25/2023   CREATININE 0.50 06/25/2023   GLUCOSE 197 (H) 06/25/2023   GFRNONAA >60 06/25/2023   CALCIUM 8.8 (L) 06/25/2023   PHOS 1.3 (L) 06/18/2023   PROT 4.5 (L) 06/21/2023   ALBUMIN 2.2 (L) 06/21/2023   BILITOT 0.6 06/21/2023   ALKPHOS 80 06/21/2023   AST 62  (H) 06/21/2023   ALT 59 (H) 06/21/2023   ANIONGAP 5 06/25/2023    GFR: Estimated Creatinine Clearance: 41 mL/min (by C-G formula based on SCr of 0.5 mg/dL).  No results found for this or any previous visit (from the past 240 hours).    Radiology Studies: No results found.    LOS: 11 days    Jacquelin Hawking, MD [facility] 06/25/2023, 6:39 PM   If 7PM-7AM, please contact night-coverage [email or web address]

## 2023-06-25 NOTE — Hospital Course (Signed)
 Olivia Ross is a 78 y.o. female with a history of vascular dementia, multiple strokes, hypertension, hyperlipidemia.  Patient presented secondary to code stroke with left gaze deviation aphasia and possible seizures.  Neurology was consulted on admission and started on Keppra.  Patient required intubation and ICU admission on admission with extubation same day.  MRI brain negative for acute stroke.  Hospitalization complicated by ongoing poor oral intake with ongoing goals of care discussions.

## 2023-06-25 NOTE — Progress Notes (Signed)
 Daily Progress Note   Patient Name: Olivia Ross       Date: 06/25/2023 DOB: [birth year]  Age: 78 y.o. MRN#: [id number] Attending Physician: Narda Bonds, MD Primary Care Physician: Stevphen Rochester, MD Admit Date: 06/14/2023  Reason for Consultation/Follow-up: Establishing goals of care  Subjective: I have reviewed medical records including EPIC notes, MAR, any available advanced directives as necessary, and labs.  Patient not progressing well with PT and continues with hyperglycemic episodes.  Received report from primary RN -no acute concerns.  RN reports patient remains lethargic and is not eating/drinking today.  Went to visit patient at bedside - no family/visitors present.  Patient was lying in bed asleep - I did not attempt to to preserve comfort. No signs or non-verbal gestures of pain or discomfort noted. No respiratory distress, increased work of breathing, or secretions noted.  Patient is ill and frail appearing.  12:35 PM Attempted to call daughter/Stephanie for ongoing GOC and support - no answer - confidential voicemail left and PMT phone number provided with request to return call.   Length of Stay: 11  Current Medications: Scheduled Meds:   amLODipine  5 mg Oral Daily   Chlorhexidine Gluconate Cloth  6 each Topical Daily   divalproex  250 mg Oral QID   feeding supplement  237 mL Oral BID BM   folic acid  1 mg Oral Daily   heparin  5,000 Units Subcutaneous Q8H   irbesartan  75 mg Oral Daily   multivitamin with minerals  1 tablet Oral Daily   octreotide  100 mcg Subcutaneous Q8H   mouth rinse  15 mL Mouth Rinse 4 times per day   pantoprazole  40 mg Oral Q1200   polyethylene glycol  17 g Oral BID   senna-docusate  2 tablet Oral BID    Continuous  Infusions:   PRN Meds: dextrose, labetalol, mouth rinse, simethicone  Physical Exam Vitals and nursing note reviewed.  Constitutional:      General: She is not in acute distress.    Appearance: She is ill-appearing.  Pulmonary:     Effort: No respiratory distress.  Skin:    General: Skin is warm and dry.  Neurological:     Motor: Weakness present.             Vital Signs: BP Marland Kitchen)  140/89 (BP Location: Left Arm)   Pulse 94   Temp 98 F (36.7 C) (Oral)   Resp 16   Ht 5\' 3"  (1.6 m)   Wt 45.5 kg   SpO2 100%   BMI 17.77 kg/m  SpO2: SpO2: 100 % O2 Device: O2 Device: Room Air O2 Flow Rate: O2 Flow Rate (L/min): 1 L/min  Intake/output summary:  Intake/Output Summary (Last 24 hours) at 06/25/2023 1206 Last data filed at 06/25/2023 0300 Gross per 24 hour  Intake 1633.55 ml  Output 1950 ml  Net -316.45 ml   LBM: Last BM Date : 06/22/23 Baseline Weight: Weight: 41.6 kg Most recent weight: Weight: 45.5 kg       Palliative Assessment/Data: PPS 10-20%      Patient Active Problem List   Diagnosis Date Noted   Seizure disorder, 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 female  with past medical history of dementia, Right MCA stroke 2022, Lt MCA Stroke 11/23, HTN, HLD who was BIB EMS for Code Stroke, left gaze and aphasic, ?h/o seizures admitted on 06/14/2023 with concern for stroke/seizure and UTI   Patient was admitted for acute hypoxic  respiratory failure and vent management after becoming apneic en route to the hospital and intubated in the ED.  She also had a seizure en route to the hospital and was treated accordingly. PMT has been consulted to assist with goals of care conversation.  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 Will continue attempts at contact Per previous discussions, daughter has expressed desire for continued aggressive interventions Patient appears to be approaching end-of-life PMT will continue to follow and support holistically  Goals of Care and Additional Recommendations: Limitations on Scope of Treatment: Full Scope Treatment  Code Status:    Code Status Orders  (From admission, onward)           Start     Ordered   06/14/23 0631  Full code  Continuous       Question:  By:  Answer:  Default: patient does not have capacity for decision making, no surrogate or prior directive available   06/14/23 0633           Code Status History     Date Active Date Inactive Code Status Order ID Comments User Context   02/03/2022 1140 02/05/2022 1850 Full Code [id number]  Jonah Blue, MD ED   08/27/2021 0713 08/29/2021 0134 Full Code [id number]  Carollee Herter, DO ED   02/03/2021 1039 02/05/2021 2118 Full Code [id number]  Bobette Mo, MD Inpatient   12/20/2020 1742 12/22/2020 2240 Full Code [id number]  Emeline General, MD Inpatient       Prognosis:  Poor  Discharge Planning: To Be Determined  Care plan was discussed with primary RN, Dr. Caleb Popp  Thank you for allowing the Palliative  Medicine Team to assist in the care of this patient.   Total Time 35 minutes Prolonged Time Billed  no       Haskel Khan, NP  Please contact Palliative Medicine Team phone at  [phone] for questions and concerns.   *Portions of this note are a verbal dictation therefore any spelling and/or grammatical errors are due to the "[employer]" system interpretation.

## 2023-06-25 NOTE — Progress Notes (Signed)
 Phlebotomy techs advised they were unable to obtain blood draw patient due to patient's confused state, struggling win tech attempts to obtain sample. Phlebotomy will make another attempt in the morning.

## 2023-06-26 ENCOUNTER — Inpatient Hospital Stay (HOSPITAL_COMMUNITY)

## 2023-06-26 DIAGNOSIS — G40409 Other generalized epilepsy and epileptic syndromes, not intractable, without status epilepticus: Secondary | ICD-10-CM | POA: Diagnosis not present

## 2023-06-26 DIAGNOSIS — Z515 Encounter for palliative care: Secondary | ICD-10-CM | POA: Diagnosis not present

## 2023-06-26 DIAGNOSIS — Z7189 Other specified counseling: Secondary | ICD-10-CM | POA: Diagnosis not present

## 2023-06-26 DIAGNOSIS — Z789 Other specified health status: Secondary | ICD-10-CM | POA: Diagnosis not present

## 2023-06-26 DIAGNOSIS — E876 Hypokalemia: Secondary | ICD-10-CM | POA: Diagnosis not present

## 2023-06-26 LAB — GLUCOSE, CAPILLARY
Glucose-Capillary: 99 mg/dL (ref 70–99)
Glucose-Capillary: 71 mg/dL (ref 70–99)
Glucose-Capillary: 101 mg/dL — ABNORMAL HIGH (ref 70–99)
Glucose-Capillary: 38 mg/dL — CL (ref 70–99)
Glucose-Capillary: <10 mg/dL — CL (ref 70–99)
Glucose-Capillary: 52 mg/dL — ABNORMAL LOW (ref 70–99)

## 2023-06-26 LAB — BASIC METABOLIC PANEL WITH GFR
Anion gap: 6 (ref 5–15)
BUN: 14 mg/dL (ref 8–23)
CO2: 27 mmol/L (ref 22–32)
Calcium: 8.7 mg/dL — ABNORMAL LOW (ref 8.9–10.3)
Chloride: 106 mmol/L (ref 98–111)
Creatinine, Ser: 0.5 mg/dL (ref 0.44–1.00)
GFR, Estimated: >60 mL/min (ref >60)
Glucose, Bld: 115 mg/dL — ABNORMAL HIGH (ref 70–99)
Potassium: 4.1 mmol/L (ref 3.5–5.1)
Sodium: 139 mmol/L (ref 135–145)

## 2023-06-26 LAB — CBC
HCT: 22.6 % — ABNORMAL LOW (ref 36.0–46.0)
Hemoglobin: 7.5 g/dL — ABNORMAL LOW (ref 12.0–15.0)
MCH: 33.8 pg (ref 26.0–34.0)
MCHC: 33.2 g/dL (ref 30.0–36.0)
MCV: 101.8 fL — ABNORMAL HIGH (ref 80.0–100.0)
Platelets: 160 10*3/uL (ref 150–400)
RBC: 2.22 MIL/uL — ABNORMAL LOW (ref 3.87–5.11)
RDW: 15.5 % (ref 11.5–15.5)
WBC: 5.1 10*3/uL (ref 4.0–10.5)
nRBC: 0.4 % — ABNORMAL HIGH (ref 0.0–0.2)

## 2023-06-26 LAB — MAGNESIUM: Magnesium: 2.4 mg/dL (ref 1.7–2.4)

## 2023-06-26 MED ORDER — BOOST / RESOURCE BREEZE PO LIQD CUSTOM
1.0000 | Freq: Two times a day (BID) | ORAL | Status: DC
  Administered 2023-06-29 – 2023-07-03 (×9): 1 via ORAL

## 2023-06-26 MED ORDER — MORPHINE SULFATE (PF) 2 MG/ML IV SOLN
1.0000 mg | INTRAVENOUS | Status: DC | PRN
  Administered 2023-06-27 – 2023-06-30 (×2): 1 mg via INTRAVENOUS
  Filled 2023-06-26 (×2): qty 1

## 2023-06-26 MED ORDER — KATE FARMS STANDARD 1.4 PO LIQD
325.0000 mL | Freq: Every day | ORAL | Status: DC
  Administered 2023-06-27 – 2023-06-30 (×3): 325 mL via ORAL
  Filled 2023-06-26 (×5): qty 325

## 2023-06-26 NOTE — Progress Notes (Signed)
 PROGRESS NOTE    Olivia Ross  ZOX:[id number] DOB: Jun 01, 1945 DOA: 06/14/2023 PCP: Stevphen Rochester, MD   Brief Narrative: Olivia Ross is a 78 y.o. female with a history of vascular dementia, multiple strokes, hypertension, hyperlipidemia.  Patient presented secondary to code stroke with left gaze deviation aphasia and possible seizures.  Neurology was consulted on admission and started on Keppra.  Patient required intubation and ICU admission on admission with extubation same day.  MRI brain negative for acute stroke.  Hospitalization complicated by ongoing poor oral intake with ongoing goals of care discussions.   Assessment and Plan:  Seizure Neurology consulted.  LTM EEG was negative for ongoing seizures and MRI was negative for acute processes to explain seizures.  Patient was initially managed on Keppra but was transitioned to valproic acid secondary to concern of lethargy. 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  Findings discussed with family with recommendations and decision to not pursue ongoing workup or to pursue management secondary to patient's underlying comorbidities.  Palliative care consulted for ongoing goals of care.  Poor oral intake Unclear if this is from acute illness versus related to progressive worsening in setting of multiple chronic illnesses and underlying dementia.  Family is concerned that medication effect could be worsening mental status.  Medications adjusted to minimize effect on mental status.  Palliative care medicine also consulted for help with goals of care discussions.  At this time, focus is for quality of life, however ongoing goals of care discussions are still being pursued. -Continue IV  fluids  Acute respiratory failure with hypoxia Patient required intubation on admission but was quickly extubated same day.  Currently on room air.  Respiratory failure resolved.  Hypoglycemia Likely related to poor oral intake in addition to underlying poor nutrition with notable cachectic body habitus with associated underweight BMI.  C-peptide elevated with concern hypoglycemia could be insulin mediated. Patient managed with D50 in addition to D5 IV fluids. -Continue D5 IV fluids -Continue octreotide  Hypokalemia Resolved with repletion  Hypomagnesemia Magnesium of 1.6 today -Magnesium supplementation -Repeat magnesium in a.m. major  Vascular dementia Noted.  Complicating current prognosis.  Anemia of chronic disease Hemoglobin of 12.2 on admission with immediate downtrend to of 9.8.  Hemoglobin has been stable around 9-10 with acute drop to 7.7 this a.m.  No obvious evidence of bleeding at this time. -CBC in a.m.  Thrombocytopenia Unclear etiology.  Mostly stable.  Oropharyngeal dysphagia Patient evaluated by speech therapy. -Speech therapy recommendations (3/21): Diet recommendations: Dysphagia 1 (puree);Thin liquid Liquids provided via: Cup;Straw Medication Administration: Crushed with puree Supervision: Full supervision/cueing for compensatory strategies Compensations: Slow rate;Small sips/bites Postural Changes and/or Swallow Maneuvers: Seated upright 90 degrees  Elevated AST/ALT Mild.  MRI of the abdomen without evidence of hepatic injury/disease.  No obvious biliary obstruction.  Normal bilirubin.  Severe malnutrition -Dietitian recommendations (3/24): Continue Ensure Enlive po BID, each supplement provides 350 kcal and 20 grams of protein. Encourage caloric beverages such as juice.  Order [employer] with dinner.  MVI with minerals and folic acid.   Goals of care Palliative care consulted for assistance in goals of care discussions.  Patient is currently full  code.  Long discussion with daughter via telephone.  Per daughter, focus is comfort and  quality of life.  She wants to focus on patient eating better to allow for better control of her blood sugar.  She believes that the antiepileptic drug treatment is contributing to patient's lethargy and decreased ability to take p.o.  When discussing the pancreatic mass with concern for cancer, daughter states that she agrees that workup and management would likely contribute negatively towards quality of life.  We also discussed CODE STATUS in addition to how much to do and how much not to do from his Dina as relates to her medical care, for which patient's daughter stated that she still needed time to think about this.  Underweight Estimated body mass index is 17.77 kg/m as calculated from the following:   Height as of this encounter: 5\' 3"  (1.6 m).   Weight as of this encounter: 45.5 kg.    DVT prophylaxis: Subcutaneous heparin Code Status:   Code Status: Full Code Family Communication: None at bedside.  Called daughter via telephone which went straight to voicemail. Disposition Plan: Discharge pending ongoing goals of care discussion/recommendations/plans   Consultants:  Neurology Palliative care medicine  Procedures:  LTM EEG Intubation/extubation  Antimicrobials: Vancomycin Cefepime Flagyl   Subjective: Patient reports that she does not think that her lunch agreed with her.  She is having some abdominal discomfort.  Objective: BP 106/70 (BP Location: Right Wrist)   Pulse 67   Temp (!) 97.5 F (36.4 C) (Axillary)   Resp 14   Ht 5\' 3"  (1.6 m)   Wt 45.5 kg   SpO2 100%   BMI 17.77 kg/m   Examination:  General exam: Appears calm and comfortable.  Cachectic. Respiratory system: Clear to auscultation. Respiratory effort normal. Cardiovascular system: S1 & S2 heard, RRR. No murmurs, rubs, gallops or clicks. Gastrointestinal system: Abdomen is nondistended, soft and tender in  right lower quadrant area.  Normal bowel sounds heard. Central nervous system: Alert. Musculoskeletal: No edema. No calf tenderness Skin: No cyanosis. No rashes   Data Reviewed: I have personally reviewed following labs and imaging studies  CBC Lab Results  Component Value Date   WBC 5.1 06/26/2023   RBC 2.22 (L) 06/26/2023   HGB 7.5 (L) 06/26/2023   HCT 22.6 (L) 06/26/2023   MCV 101.8 (H) 06/26/2023   MCH 33.8 06/26/2023   PLT 160 06/26/2023   MCHC 33.2 06/26/2023   RDW 15.5 06/26/2023   LYMPHSABS 1.0 06/25/2023   MONOABS 0.4 06/25/2023   EOSABS 0.1 06/25/2023   BASOSABS 0.0 06/25/2023     Last metabolic panel Lab Results  Component Value Date   NA 139 06/26/2023   K 4.1 06/26/2023   CL 106 06/26/2023   CO2 27 06/26/2023   BUN 14 06/26/2023   CREATININE 0.50 06/26/2023   GLUCOSE 115 (H) 06/26/2023   GFRNONAA >60 06/26/2023   CALCIUM 8.7 (L) 06/26/2023   PHOS 1.3 (L) 06/18/2023   PROT 4.5 (L) 06/21/2023   ALBUMIN 2.2 (L) 06/21/2023   BILITOT 0.6 06/21/2023   ALKPHOS 80 06/21/2023   AST 62 (H) 06/21/2023   ALT 59 (H) 06/21/2023   ANIONGAP 6 06/26/2023    GFR: Estimated Creatinine Clearance: 41 mL/min (by C-G formula based on SCr of 0.5 mg/dL).  No results found for this or any previous visit (from the past 240 hours).    Radiology Studies: No results found.    LOS: 12 days    Jacquelin Hawking, MD [facility] 06/26/2023, 12:49 PM   If 7PM-7AM, please contact night-coverage [email or web address]

## 2023-06-26 NOTE — Plan of Care (Signed)
  Problem: Tissue Perfusion: Goal: Adequacy of tissue perfusion will improve Outcome: Progressing   Problem: Clinical Measurements: Goal: Will remain free from infection Outcome: Progressing   Problem: Activity: Goal: Risk for activity intolerance will decrease Outcome: Progressing

## 2023-06-26 NOTE — Plan of Care (Signed)

## 2023-06-26 NOTE — Progress Notes (Signed)
 Daily Progress Note   Patient Name: Olivia Ross       Date: 06/26/2023 DOB: [birth year]  Age: 77 y.o. MRN#: [id number] Attending Physician: Narda Bonds, MD Primary Care Physician: Stevphen Rochester, MD Admit Date: 06/14/2023  Reason for Consultation/Follow-up: Establishing goals of care  Subjective: I have reviewed medical records including EPIC notes, MAR, any available advanced directives as necessary, and labs. Received report from primary RN - no acute concerns. RN reports patient remains drowsy with poor oral intake - ate about 5% of breakfast.   Went to visit patient at bedside - no family/visitors present. Patient was lying in bed asleep - I did not attempt to wake her. No signs or non-verbal gestures of pain or discomfort noted. No respiratory distress, increased work of breathing, or secretions noted. She is frail appearing.  Called daughter/Olivia Ross - emotional support provided. Lengthy discussion had today regarding her concerns of:   Over sedation with depakote and unsure why keppra was stopped (she previously requested keppra dose decreased, not medication change). We reviewed the balance of trying to avoid clinical seizure recurrence as well as minimize sedation, which sometimes cannot be met; however, Olivia Ross overall was hopeful for patient to try per PTA keppra dose of 250mg  BID. Reviewed that unless there was a specific cause for seizure that has otherwise been treated, previous dose may no longer adequately manage symptoms; however, would see if neurology/attending would have further input regarding dosing and medication in context of her concerns.  Concerns that patient is being given dairy when "she has difficulty processing dairy" and thinks this may be causing upset  stomach and poor intake. Will reach out to RD to see if there are any alternatives to nutritional supplements. Diet order adjusted to indicate desire to avoid dairy.  Concern about patient's previous plan of care with previous attendings  Expressed concern over patient's poor oral intake. Per Olivia Ross, patient is eating almost all of her dinner when she visits in the evening. Olivia Ross states that "it just takes time." Reviewed that medical staff certainly encourage and spend time offering food, but are not able to sit at her bedside for extended periods of time. Encouraged family to assist with all meals if this is the case. Also reviewed that sometimes patient's respond better to family offering assistance rather than medical staff.  Encouraged Olivia Ross to order meals for patient that she may enjoy. Attempted to discuss that patient has been chronically malnourished so unlikely poor intake is acute and this could be natural trajectory at EOL. Olivia Ross continues to voice concern that patient's poor intake is due to over sedation from seizure medications.   Discussed in detail the course of glucose management and hypoglycemic episodes. Patient has not had an episode of hypoglycemia in the last 24 hours; however, has not yet been able to sustain herself off continuous dextrose infusion. Reviewed that patient had also received serum glucose readings throughout the day of 3/24 and 3/25 as well as daily. Olivia Ross understands the concern about ongoing adequate glucose management in context of possible pancreatic neoplasm.  All questions and concerns addressed. Encouraged to call with questions and/or concerns. PMT number previously provided.  Discussed case with Dr. Caleb Ross in detail - he will attempt to speak with daughter regarding seizure medications and also discuss with neuro as appropriate.  Discussed dairy concerns with RD - they will make adjustments.   Length of Stay: 12  Current  Medications: Scheduled Meds:   amLODipine  5 mg Oral Daily   Chlorhexidine Gluconate Cloth  6 each Topical Daily   divalproex  250 mg Oral QID   feeding supplement  237 mL Oral BID BM   folic acid  1 mg Oral Daily   heparin  5,000 Units Subcutaneous Q8H   irbesartan  75 mg Oral Daily   multivitamin with minerals  1 tablet Oral Daily   octreotide  100 mcg Subcutaneous Q8H   mouth rinse  15 mL Mouth Rinse 4 times per day   pantoprazole  40 mg Oral Q1200   polyethylene glycol  17 g Oral BID   senna-docusate  2 tablet Oral BID    Continuous Infusions:  dextrose 5 % and 0.45 % NaCl 80 mL/hr at 06/25/23 1807    PRN Meds: dextrose, labetalol, mouth rinse, simethicone  Physical Exam Vitals and nursing note reviewed.  Constitutional:      General: She is not in acute distress.    Appearance: She is ill-appearing.  Pulmonary:     Effort: No respiratory distress.  Skin:    General: Skin is warm and dry.  Neurological:     Motor: Weakness present.             Vital Signs: BP 106/70 (BP Location: Right Wrist)   Pulse 67   Temp (!) 97.5 F (36.4 C) (Axillary)   Resp 14   Ht 5\' 3"  (1.6 m)   Wt 45.5 kg   SpO2 100%   BMI 17.77 kg/m  SpO2: SpO2: 100 % O2 Device: O2 Device: Room Air O2 Flow Rate: O2 Flow Rate (L/min): 1 L/min  Intake/output summary:  Intake/Output Summary (Last 24 hours) at 06/26/2023 0957 Last data filed at 06/26/2023 0500 Gross per 24 hour  Intake 650.45 ml  Output 2525 ml  Net -1874.55 ml   LBM: Last BM Date : 06/25/23 Baseline Weight: Weight: 41.6 kg Most recent weight: Weight: 45.5 kg       Palliative Assessment/Data: PPS 10-20%      Patient Active Problem List   Diagnosis Date Noted   Seizure disorder, 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 female  with past medical history of dementia, Right MCA stroke 2022, Lt MCA Stroke 11/23, HTN, HLD who was BIB EMS for Code Stroke, left gaze and aphasic, ?h/o seizures admitted on 06/14/2023 with concern for stroke/seizure and UTI   Patient was admitted for acute hypoxic respiratory failure and vent management after becoming apneic en route to the hospital and intubated in the ED.  She also had a seizure en route to the hospital and was treated accordingly. PMT has been consulted to assist with goals of care conversation.  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 She is aware the balance of trying to avoid clinical seizure recurrence as well as minimize sedation sometimes cannot be met. Attending/neurology to continue discussions RD notified of daughters concern about dairy intolerance - they will make adjustments to nutritional supplements as necessary. Diet order also adjusted to reflect preference to avoid dairy Patient appears to be approaching end of life PMT will continue to follow and support holistically  Goals of Care and Additional Recommendations: Limitations on Scope of Treatment: Full Scope Treatment  Code Status:    Code Status Orders  (From admission, onward)           Start     Ordered   06/14/23 0631  Full code  Continuous       Question:  By:  Answer:  Default: patient does not have capacity for decision making, no surrogate  or prior directive available   06/14/23 0633           Code Status History     Date Active Date Inactive Code Status Order ID Comments User Context   02/03/2022 1140 02/05/2022 1850 Full Code [id number]  Jonah Blue, MD ED   08/27/2021 0713 08/29/2021 0134 Full Code [id number]  Carollee Herter, DO ED   02/03/2021 1039 02/05/2021 2118 Full Code [id number]  Bobette Mo, MD Inpatient   12/20/2020 1742 12/22/2020 2240 Full Code [id number]  Emeline General, MD Inpatient       Prognosis:  Poor  Discharge Planning: To Be Determined  Care plan was discussed with primary RN, patient's daughter, Dr. Caleb Ross, RD  Thank you for allowing the Palliative Medicine Team to assist in the care of this patient.   Total Time 50 minutes Prolonged Time Billed  no       Haskel Khan, NP  Please contact Palliative Medicine Team phone at [phone] for questions and concerns.   *Portions of this note are a verbal dictation therefore any spelling and/or grammatical errors are due to the "[employer]" system interpretation.

## 2023-06-26 NOTE — Progress Notes (Signed)
 Nutrition Brief Note  Secure chat received from PMT-NP regarding nutrition supplements.  Called and spoke with patient's daughter via phone call per request.   Patient's daughter reports that she is typically able to eat on her own and if she is fed however expresses concerns that she has been more lethargic and drowsy with medication administration.   She mentions that her whole family has lactose intolerance and patient has always avoided dairy products. She has noticed pt has had multiple loose stools during admission and suspects it to be r/t Ensure and Magic cups. Provided alternative nutrition supplement options (boost breeze and [employer]) for pt to try. She is amenable to this plan.   Will continue to follow up as appropriate, however encouraged pt's daughter to reach out if additional nutrition related concerns/needs should arise.   Drusilla Kanner, RDN, LDN Clinical Nutrition See AMiON for contact information.

## 2023-06-26 NOTE — Progress Notes (Signed)
 Abdominal pain secondary to pancreatic mass Patient is complaining about abdominal pain.  Per chart review patient has pancreatic mass.  Ordered morphine 1 mg every 4 hours as needed for moderate and severe pain.   Tereasa Coop, MD [facility] 06/26/2023, 8:14 PM

## 2023-06-27 ENCOUNTER — Other Ambulatory Visit: Payer: Self-pay

## 2023-06-27 ENCOUNTER — Inpatient Hospital Stay (HOSPITAL_COMMUNITY)

## 2023-06-27 DIAGNOSIS — G40409 Other generalized epilepsy and epileptic syndromes, not intractable, without status epilepticus: Secondary | ICD-10-CM | POA: Diagnosis not present

## 2023-06-27 DIAGNOSIS — M7989 Other specified soft tissue disorders: Secondary | ICD-10-CM

## 2023-06-27 DIAGNOSIS — Z789 Other specified health status: Secondary | ICD-10-CM | POA: Diagnosis not present

## 2023-06-27 DIAGNOSIS — E876 Hypokalemia: Secondary | ICD-10-CM | POA: Diagnosis not present

## 2023-06-27 DIAGNOSIS — Z515 Encounter for palliative care: Secondary | ICD-10-CM | POA: Diagnosis not present

## 2023-06-27 DIAGNOSIS — Z7189 Other specified counseling: Secondary | ICD-10-CM | POA: Diagnosis not present

## 2023-06-27 DIAGNOSIS — R569 Unspecified convulsions: Secondary | ICD-10-CM | POA: Diagnosis not present

## 2023-06-27 LAB — COMPREHENSIVE METABOLIC PANEL WITH GFR
ALT: 23 U/L (ref 0–44)
AST: 16 U/L (ref 15–41)
Albumin: 2.2 g/dL — ABNORMAL LOW (ref 3.5–5.0)
Alkaline Phosphatase: 86 U/L (ref 38–126)
Anion gap: 6 (ref 5–15)
BUN: 17 mg/dL (ref 8–23)
CO2: 26 mmol/L (ref 22–32)
Calcium: 8.7 mg/dL — ABNORMAL LOW (ref 8.9–10.3)
Chloride: 106 mmol/L (ref 98–111)
Creatinine, Ser: 0.51 mg/dL (ref 0.44–1.00)
GFR, Estimated: >60 mL/min (ref >60)
Glucose, Bld: 103 mg/dL — ABNORMAL HIGH (ref 70–99)
Potassium: 4.5 mmol/L (ref 3.5–5.1)
Sodium: 138 mmol/L (ref 135–145)
Total Bilirubin: 0.6 mg/dL (ref 0.0–1.2)
Total Protein: 4.6 g/dL — ABNORMAL LOW (ref 6.5–8.1)

## 2023-06-27 LAB — GLUCOSE, CAPILLARY
Glucose-Capillary: 99 mg/dL (ref 70–99)
Glucose-Capillary: 88 mg/dL (ref 70–99)
Glucose-Capillary: 103 mg/dL — ABNORMAL HIGH (ref 70–99)
Glucose-Capillary: 13 mg/dL — CL (ref 70–99)
Glucose-Capillary: 78 mg/dL (ref 70–99)
Glucose-Capillary: 76 mg/dL (ref 70–99)

## 2023-06-27 LAB — CBC
HCT: 24.9 % — ABNORMAL LOW (ref 36.0–46.0)
Hemoglobin: 8 g/dL — ABNORMAL LOW (ref 12.0–15.0)
MCH: 32.9 pg (ref 26.0–34.0)
MCHC: 32.1 g/dL (ref 30.0–36.0)
MCV: 102.5 fL — ABNORMAL HIGH (ref 80.0–100.0)
Platelets: 201 10*3/uL (ref 150–400)
RBC: 2.43 MIL/uL — ABNORMAL LOW (ref 3.87–5.11)
RDW: 15.7 % — ABNORMAL HIGH (ref 11.5–15.5)
WBC: 5.7 10*3/uL (ref 4.0–10.5)
nRBC: 0.3 % — ABNORMAL HIGH (ref 0.0–0.2)

## 2023-06-27 LAB — AMMONIA: Ammonia: 51 umol/L — ABNORMAL HIGH (ref 9–35)

## 2023-06-27 LAB — MAGNESIUM: Magnesium: 1.9 mg/dL (ref 1.7–2.4)

## 2023-06-27 MED ORDER — ACETAMINOPHEN 325 MG PO TABS: 650.0000 mg | ORAL_TABLET | Freq: Four times a day (QID) | ORAL | Status: DC | PRN

## 2023-06-27 MED ORDER — LEVOCARNITINE 1 GM/10ML PO SOLN
500.0000 mg | Freq: Every day | ORAL | Status: DC
  Administered 2023-06-27 – 2023-07-01 (×5): 500 mg via ORAL
  Filled 2023-06-27 (×5): qty 5

## 2023-06-27 NOTE — Progress Notes (Signed)
 IVT consult placed to assess upper arm PIV placed by IVT. Concern for infiltration. Upon assessment arm is swollen but no signs of infiltration noted. Pitting edema noted throughout the bilateral upper extremities. No redness or tenderness noted when flushing PIV.   If access is needed for prolonged therapy, I recommend alternative means due to multiple peripheral access attempts during hospitalization.   Zylon Creamer Loyola Mast, RN

## 2023-06-27 NOTE — Progress Notes (Signed)
 Physical Therapy Treatment Patient Details Name: Olivia Ross MRN: [id number] DOB: 06/08/1945 Today's Date: 06/27/2023   History of Present Illness 78 y.o. female presents to ED 06/14/23 for Code Stroke, left gaze and aphasic, questionable seizure. EEG showing potential moderate diffuse encephalopathy with no apparent seizure activity.  Intubated <24 hours  PMH: dementia, HTN, right breast cancer, R MCA CVA, L MCA CVA    PT Comments  Pt resting in bed on arrival, alert at start of session, however pt becoming more lethargic as session progressed due to fatigue. Pt talkative throughout session greeting this PTA on arrival and making conversation about family members throughout. Pt able to maintain sitting balance EOB with close CGA for safety due to flexed posture. Pt able to follow commands briefly at EOB for LE therex. Pt able to begin return of trunk to supine, ultimately requiring max A to complete. Pt continues to require total A to come to sitting EOB due to poor command following. Pt continues to benefit from skilled PT services to progress toward functional mobility goals.      If plan is discharge home, recommend the following: A lot of help with walking and/or transfers;A lot of help with bathing/dressing/bathroom;Assistance with cooking/housework;Assist for transportation;Help with stairs or ramp for entrance;Supervision due to cognitive status   Can travel by private vehicle        Equipment Recommendations  Hospital bed    Recommendations for Other Services       Precautions / Restrictions Precautions Precautions: Fall Precaution/Restrictions Comments: multiple bony prominences--currently covered wtih Mepilix, watch glucose levels Restrictions Weight Bearing Restrictions Per Provider Order: No     Mobility  Bed Mobility Overal bed mobility: Needs Assistance Bed Mobility: Supine to Sit, Sit to Supine     Supine to sit: Total assist Sit to supine: Max assist    General bed mobility comments: total A to come to sitting EOB, max A to retun to supine as pt reaching for rail with LUE and beginning trunk descent to supine    Transfers                   General transfer comment: not attempted due to lethargy    Ambulation/Gait               General Gait Details: pt does not ambulate   Stairs             Wheelchair Mobility     Tilt Bed    Modified Rankin (Stroke Patients Only)       Balance Overall balance assessment: Needs assistance Sitting-balance support: Feet supported, No upper extremity supported Sitting balance-Leahy Scale: Poor Sitting balance - Comments: able to maintiain statically without assist, R lean and LOB with fatigue pt keeping bil hands on lap                                    Communication Communication Communication:  (dementia)  Cognition Arousal: Lethargic Behavior During Therapy: Flat affect   PT - Cognitive impairments: History of cognitive impairments                       PT - Cognition Comments: limited command following this session pt reposnding appropriately to simple conversation Following commands: Impaired Following commands impaired: Follows one step commands inconsistently    Cueing Cueing Techniques: Verbal cues, Gestural cues, Tactile cues  Exercises Other  Exercises Other Exercises: pt able to follow commandfs brifely with heel raises x3 on ea side in sittine    General Comments        Pertinent Vitals/Pain Pain Assessment Pain Assessment: Faces Faces Pain Scale: Hurts little more Pain Location: bottom with rolling Pain Descriptors / Indicators: Moaning, Grimacing, Guarding Pain Intervention(s): Repositioned, Monitored during session, Limited activity within patient's tolerance    Home Living                          Prior Function            PT Goals (current goals can now be found in the care plan section) Acute  Rehab PT Goals Patient Stated Goal: none stated PT Goal Formulation: Patient unable to participate in goal setting Time For Goal Achievement: 07/01/23 Progress towards PT goals: Progressing toward goals    Frequency    Min 1X/week      PT Plan      Co-evaluation              AM-PAC PT "6 Clicks" Mobility   Outcome Measure  Help needed turning from your back to your side while in a flat bed without using bedrails?: Total Help needed moving from lying on your back to sitting on the side of a flat bed without using bedrails?: Total Help needed moving to and from a bed to a chair (including a wheelchair)?: Total Help needed standing up from a chair using your arms (e.g., wheelchair or bedside chair)?: Total Help needed to walk in hospital room?: Total Help needed climbing 3-5 steps with a railing? : Total 6 Click Score: 6    End of Session   Activity Tolerance: Patient limited by fatigue;Patient limited by lethargy Patient left: with call bell/phone within reach;in bed;with nursing/sitter in room;with bed alarm set Nurse Communication: Mobility status PT Visit Diagnosis: Muscle weakness (generalized) (M62.81)     Time: 2130-8657 PT Time Calculation (min) (ACUTE ONLY): 15 min  Charges:    $Therapeutic Activity: 8-22 mins PT General Charges $$ ACUTE PT VISIT: 1 Visit                     Tobi Bastos R. PTA Acute Rehabilitation Services Office: [phone]   Catalina Antigua 06/27/2023, 10:44 AM

## 2023-06-27 NOTE — Progress Notes (Signed)
 Left upper extremity venous  has been completed. Refer to [facility] under chart review to view preliminary results.   06/27/2023  12:57 PM Kresha Abelson, Gerarda Gunther

## 2023-06-27 NOTE — Progress Notes (Signed)
 Attempted to contact daughter via telephone for PICC consent.  Call goes immediately to voice mail.  Will attempt to contact again later.  Per Lelon Mast, RN daughter is not at bedside, updated on above contact attempt.

## 2023-06-27 NOTE — Progress Notes (Signed)
 PROGRESS NOTE    Olivia Ross  HYQ:[id number] DOB: July 11, 1945 DOA: 06/14/2023 PCP: Stevphen Rochester, MD   Brief Narrative: Olivia Ross is a 78 y.o. female with a history of vascular dementia, multiple strokes, hypertension, hyperlipidemia.  Patient presented secondary to code stroke with left gaze deviation aphasia and possible seizures.  Neurology was consulted on admission and started on Keppra.  Patient required intubation and ICU admission on admission with extubation same day.  MRI brain negative for acute stroke.  Hospitalization complicated by ongoing poor oral intake with ongoing goals of care discussions.   Assessment and Plan:  Seizure Neurology consulted.  LTM EEG was negative for ongoing seizures and MRI was negative for acute processes to explain seizures.  Patient was initially managed on Keppra but was transitioned to valproic acid secondary to concern of lethargy. 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  Findings discussed with family with recommendations and decision to not pursue ongoing workup or to pursue management secondary to patient's underlying comorbidities.  Palliative care consulted for ongoing goals of care.  Poor oral intake Unclear if this is from acute illness versus related to progressive worsening in setting of multiple chronic illnesses and underlying dementia.  Family is concerned that medication effect could be worsening mental status.  Medications adjusted to minimize effect on mental status.  Palliative care medicine also consulted for help with goals of care discussions.  At this time, focus is for quality of life, however ongoing goals of care discussions are still being pursued. -Continue IV fluids  Left  arm swelling Likely related to malfunctioning IV. -Check upper extremity venous duplex -Remove IV  Acute respiratory failure with hypoxia Patient required intubation on admission but was quickly extubated same day.  Currently on room air.  Respiratory failure resolved.  Hypoglycemia Likely related to poor oral intake in addition to underlying poor nutrition with notable cachectic body habitus with associated underweight BMI.  C-peptide elevated with concern hypoglycemia could be insulin mediated. Patient managed with D50 in addition to D5 IV fluids. Incongruence between CBGs from left and right hand, possibly related to significant left upper extremity edema. -Continue D5 IV fluids -Continue octreotide -Check CBGs on right hand only  Ileus Noted on x-ray. No nausea/vomiting. Abdomen is non-distended and non-tender today.  Hypokalemia Resolved with repletion  Hypomagnesemia Magnesium of 1.6 today -Magnesium supplementation -Repeat magnesium in a.m. major  Vascular dementia Noted.  Complicating current prognosis.  Anemia of chronic disease Hemoglobin of 12.2 on admission with immediate downtrend to of 9.8.  Hemoglobin has been stable around 9-10 with acute drop to 7.7. Hemoglobin stabile.  No obvious evidence of bleeding at this time.  Thrombocytopenia Unclear etiology.  Possibly related to acute illness. Resolved.  Oropharyngeal dysphagia Patient evaluated by speech therapy. -Speech therapy recommendations (3/21): Diet recommendations: Dysphagia 1 (puree);Thin liquid Liquids provided via: Cup;Straw Medication Administration: Crushed with puree Supervision: Full supervision/cueing for compensatory strategies Compensations: Slow rate;Small sips/bites Postural Changes and/or Swallow Maneuvers: Seated upright 90 degrees  Elevated AST/ALT Mild.  MRI of the abdomen without evidence of hepatic injury/disease.  No obvious biliary obstruction.  Normal bilirubin.  Severe  malnutrition -Dietitian recommendations (3/24): Continue Ensure Enlive po BID, each supplement provides 350 kcal and 20 grams of protein. Encourage caloric  beverages such as juice.  Order [employer] with dinner.  MVI with minerals and folic acid.   Goals of care Palliative care consulted for assistance in goals of care discussions.  Patient is currently full code.  Long discussion with daughter via telephone.  Per daughter, focus is comfort and quality of life.  She wants to focus on patient eating better to allow for better control of her blood sugar.  She believes that the antiepileptic drug treatment is contributing to patient's lethargy and decreased ability to take p.o.  When discussing the pancreatic mass with concern for cancer, daughter states that she agrees that workup and management would likely contribute negatively towards quality of life.  We also discussed CODE STATUS in addition to how much to do and how much not to do from his [place] as relates to her medical care, for which patient's daughter stated that she still needed time to think about this.  Underweight Estimated body mass index is 17.77 kg/m as calculated from the following:   Height as of this encounter: 5\' 3"  (1.6 m).   Weight as of this encounter: 45.5 kg.    DVT prophylaxis: Subcutaneous heparin Code Status:   Code Status: Full Code Family Communication: Daughter at bedside Disposition Plan: Discharge pending ongoing goals of care discussion/recommendations/plans   Consultants:  Neurology Palliative care medicine  Procedures:  LTM EEG Intubation/extubation  Antimicrobials: Vancomycin Cefepime Flagyl   Subjective: Patient with no concerns this morning. No abdominal pain.  Objective: BP (!) 141/63 (BP Location: Right Wrist)   Pulse 77   Temp 98.3 F (36.8 C) (Oral)   Resp 14   Ht 5\' 3"  (1.6 m)   Wt 45.5 kg   SpO2 100%   BMI 17.77 kg/m   Examination:  General exam: Appears calm and  comfortable Respiratory system: Clear to auscultation. Respiratory effort normal. Cardiovascular system: S1 & S2 heard, RRR. No murmurs. Gastrointestinal system: Abdomen is nondistended, soft and nontender. Normal bowel sounds heard. Central nervous system: Alert and oriented. No focal neurological deficits. Musculoskeletal: 2+ LUE pitting edema. No calf tenderness Psychiatry: Judgement and insight appear normal. Mood & affect appropriate.    Data Reviewed: I have personally reviewed following labs and imaging studies  CBC Lab Results  Component Value Date   WBC 5.7 06/27/2023   RBC 2.43 (L) 06/27/2023   HGB 8.0 (L) 06/27/2023   HCT 24.9 (L) 06/27/2023   MCV 102.5 (H) 06/27/2023   MCH 32.9 06/27/2023   PLT 201 06/27/2023   MCHC 32.1 06/27/2023   RDW 15.7 (H) 06/27/2023   LYMPHSABS 1.0 06/25/2023   MONOABS 0.4 06/25/2023   EOSABS 0.1 06/25/2023   BASOSABS 0.0 06/25/2023     Last metabolic panel Lab Results  Component Value Date   NA 138 06/27/2023   K 4.5 06/27/2023   CL 106 06/27/2023   CO2 26 06/27/2023   BUN 17 06/27/2023   CREATININE 0.51 06/27/2023   GLUCOSE 103 (H) 06/27/2023   GFRNONAA >60 06/27/2023   CALCIUM 8.7 (L) 06/27/2023   PHOS 1.3 (L) 06/18/2023   PROT 4.6 (L) 06/27/2023   ALBUMIN 2.2 (L) 06/27/2023   BILITOT 0.6 06/27/2023   ALKPHOS 86 06/27/2023   AST 16 06/27/2023   ALT 23 06/27/2023   ANIONGAP 6 06/27/2023    GFR: Estimated Creatinine Clearance: 41 mL/min (by C-G formula based on SCr of 0.51 mg/dL).  No results found for this or any previous visit (from the past 240 hours).  Radiology Studies: [place] EKG SITE RITE Result Date: 06/27/2023 If [facility] image not attached, placement could not be confirmed due to current cardiac rhythm.  DG Abd Portable 1V Result Date: 06/26/2023 CLINICAL DATA:  Abdominal pain EXAM: PORTABLE ABDOMEN - 1 VIEW COMPARISON:  CT abdomen and pelvis 06/15/2023 FINDINGS: Gas-filled nondistended large and small  bowel. Appearances are likely to represent ileus. No radiopaque stones are demonstrated. Surgical clips in the right upper quadrant. Vascular calcification in the aorta. Lung bases are clear. Degenerative changes in the spine and hips. Patient rotation limits examination. IMPRESSION: Gas-filled nondistended small and large bowel likely representing ileus. Electronically Signed   By: Burman Nieves M.D.   On: 06/26/2023 19:47      LOS: 13 days    Jacquelin Hawking, MD [facility] 06/27/2023, 12:18 PM   If 7PM-7AM, please contact night-coverage [email or web address]

## 2023-06-27 NOTE — Progress Notes (Signed)
 NEUROLOGY CONSULT FOLLOW UP NOTE   Date of service: June 27, 2023 Patient Name: Olivia Ross MRN:  [id number] DOB:  05-Apr-1945  Primary team has requested reevaluation on behalf of the daughter for consideration of changing or reducing antiseizure medications  Interval Hx/subjective   - Received morphine at 12:18 PM   Vitals   Vitals:   06/27/23 0318 06/27/23 0802 06/27/23 1206 06/27/23 1553  BP: (!) 132/118 (!) 135/93 (!) 141/63 (!) 117/48  Pulse: 85 76 77 75  Resp: 17 16 14 15   Temp:  98.3 F (36.8 C)  (!) 97.5 F (36.4 C)  TempSrc:  Oral Oral Axillary  SpO2: 91% 100% 100% 95%  Weight:      Height:         Body mass index is 17.77 kg/m.  Physical Exam   Constitutional:  appears frail, old and cachectic  Respiratory: Effort normal, non-labored breathing.   Mental Status: Sleeping on arrival, when I attempt to rouse her she cries out "Mama, Mama"  Increased tone with decreased bulk.  Right upper extremity is contracted.  Left upper extremity difficult to examine due to positioning Withdraws bilateral feet to light tickle  Will not open eyes to command today Exhibits rooting reflex, mouthing her pillow   Medications  Current Facility-Administered Medications:    acetaminophen (TYLENOL) tablet 650 mg, 650 mg, Oral, Q6H PRN, Cooper, Josseline P, PA-C   amLODipine (NORVASC) tablet 5 mg, 5 mg, Oral, Daily, Osvaldo Shipper, MD, 5 mg at 06/27/23 0845   Chlorhexidine Gluconate Cloth 2 % PADS 6 each, 6 each, Topical, Daily, Lorin Glass, MD, 6 each at 06/27/23 0846   dextrose 50 % solution 50 mL, 1 ampule, Intravenous, Q1H PRN, Howerter, Justin B, DO, 50 mL at 06/26/23 2315   divalproex (DEPAKOTE SPRINKLE) capsule 250 mg, 250 mg, Oral, QID, Shafer, Devon, NP, 250 mg at 06/27/23 1456   feeding supplement (BOOST / RESOURCE BREEZE) liquid 1 Container, 1 Container, Oral, BID BM, Narda Bonds, MD   feeding supplement (KATE FARMS STANDARD 1.4) liquid 325 mL, 325 mL,  Oral, Daily, Narda Bonds, MD, 325 mL at 06/27/23 1219   folic acid (FOLVITE) tablet 1 mg, 1 mg, Oral, Daily, Osvaldo Shipper, MD, 1 mg at 06/27/23 0845   heparin injection 5,000 Units, 5,000 Units, Subcutaneous, Q8H, Rozann Lesches, MD, 5,000 Units at 06/27/23 1456   irbesartan (AVAPRO) tablet 75 mg, 75 mg, Oral, Daily, Osvaldo Shipper, MD, 75 mg at 06/27/23 0845   labetalol (NORMODYNE) injection 10 mg, 10 mg, Intravenous, Q2H PRN, Lorin Glass, MD   levOCARNitine (CARNITOR) 1 GM/10ML solution 500 mg, 500 mg, Oral, Daily, Adden Strout L, MD   morphine (PF) 2 MG/ML injection 1 mg, 1 mg, Intravenous, Q4H PRN, Sundil, Subrina, MD, 1 mg at 06/27/23 1218   multivitamin with minerals tablet 1 tablet, 1 tablet, Oral, Daily, Alekh, Kshitiz, MD, 1 tablet at 06/27/23 0845   octreotide (SANDOSTATIN) injection 100 mcg, 100 mcg, Subcutaneous, Q8H, Alekh, Kshitiz, MD, 100 mcg at 06/27/23 1456   Oral care mouth rinse, 15 mL, Mouth Rinse, 4 times per day, Lorin Glass, MD, 15 mL at 06/27/23 1456   Oral care mouth rinse, 15 mL, Mouth Rinse, PRN, Lorin Glass, MD   pantoprazole (PROTONIX) EC tablet 40 mg, 40 mg, Oral, Q1200, Osvaldo Shipper, MD, 40 mg at 06/27/23 1218   polyethylene glycol (MIRALAX / GLYCOLAX) packet 17 g, 17 g, Oral, BID, Osvaldo Shipper, MD, 17 g at 06/27/23 [phone]  senna-docusate (Senokot-S) tablet 2 tablet, 2 tablet, Oral, BID, Osvaldo Shipper, MD, 2 tablet at 06/27/23 0845   simethicone (MYLICON) 40 MG/0.6ML suspension 80 mg, 80 mg, Oral, Q6H PRN, Glade Lloyd, MD, 80 mg at 06/23/23 1837  Labs and Diagnostic Imaging    Basic Metabolic Panel: Recent Labs  Lab 06/23/23 1121 06/23/23 1438 06/24/23 0341 06/24/23 0736 06/24/23 1231 06/24/23 2047 06/25/23 1157 06/26/23 0346 06/27/23 0330  NA 137   < > 133*   < > 132* 136 135 139 138  K 3.7   < > 3.7   < > 3.9 3.9 4.1 4.1 4.5  CL 106   < > 103   < > 101 103 101 106 106  CO2 20*   < > 23   < > 24 23 29 27 26   GLUCOSE  114*   < > 130*   < > 236* 96 197* 115* 103*  BUN 15   < > 14   < > 17 18 18 14 17   CREATININE 0.53   < > 0.58   < > 0.54 0.56 0.50 0.50 0.51  CALCIUM 9.0   < > 9.2   < > 8.9 9.4 8.8* 8.7* 8.7*  MG 1.4*  --  1.2*  --   --   --  1.6* 2.4 1.9   < > = values in this interval not displayed.    CBC: Recent Labs  Lab 06/21/23 0322 06/25/23 1157 06/26/23 0346 06/27/23 0330  WBC 6.0 5.0 5.1 5.7  NEUTROABS 3.9 3.6  --   --   HGB 8.1* 7.7* 7.5* 8.0*  HCT 24.6* 23.5* 22.6* 24.9*  MCV 101.2* 101.3* 101.8* 102.5*  PLT 119* 94* 160 201    Coagulation Studies: No results for input(s): "LABPROT", "INR" in the last 72 hours.     Latest Reference Range & Units 06/18/23 03:25 06/20/23 14:38 06/21/23 03:22  AST 15 - 41 U/L 46 (H) 45 (H) 62 (H)  ALT 0 - 44 U/L 41 46 (H) 59 (H)  (H): Data is abnormally high  Lab Results  Component Value Date   ALT 23 06/27/2023   AST 16 06/27/2023   ALKPHOS 86 06/27/2023   BILITOT 0.6 06/27/2023     AED levels:  Lab Results  Component Value Date   VALPROATE 61 06/23/2023   Lab Results  Component Value Date   AMMONIA 51 (H) 06/27/2023   Increased from 15 on 3/21   CT Head without contrast(Personally reviewed): 1. No acute or interval finding. 2. 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  CT angio Head and Neck with contrast(Personally reviewed): 1. No emergent finding. 2. Atherosclerosis which severely affects intracranial circulation. 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  MRI Brain(Personally reviewed): 1. No acute intracranial abnormality. 2. Old left frontal, left parietal and right parietotemporal infarcts. 3. 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   Assessment  Olivia Ross is a 78 y.o. 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 She had a GTC seizure that was witnessed by EMS en route with forced L gaze deviation, outstretched arms and was given Versed. She was intubated on arrival to the ED for airway protection. LTM EEG showed continuous generalized slowing  without epileptiform abnormalities. She has now been extubated and has had no further clinical or electrographic seizures. Suspect seizure 2/2 known cortical strokes.   Family was first concerned that the keppra is making Ms. Banwart more sedated.  She was then loaded with 1000mg  of valproic acid with scheduled 250 mg PO TID.  VPA level was 47 on 3/22, therefore was increased VPA to 250 mg 4 times per day (Depakote now is in within good therapeutic range at 61), keppra was decreased to 250mg  x 2 days and then discontinued.  Now we are reconsulted to consider changing antiseizure medications again, with consideration that perhaps her seizure had been partially provoked by hypoglycemia -- now seems to be pseudohypoglycemia.    Fluctuation in exam today I think most consistent with earlier morphine administration  Generally patients that experience sedation with Keppra have less sedation with Depakote so would not favor changing back to Keppra in this patient especially as daughter confirms overall she is improving. Adding L-carnitine to address hyperammonemia side effect  Recommendations  -Continue valproic acid at 250 mg 4 times daily  -Add L-carnitine 500 mg daily for valproic acid associated hyperammoniemia  -14 min discussion with daughter; all questions answered  -Outpatient referral placed to Dr. Teresa Coombs  -Neurology will sign off at this time.  Please reach out if the patient has breakthrough seizures or new questions or concerns arise  ______________________________________________________________________  Brooke Dare MD-PhD [facility] [phone] Available 7 AM to 7 PM,  outside these hours please contact Neurologist on call listed on AMION

## 2023-06-27 NOTE — Progress Notes (Signed)
 Daily Progress Note   Patient Name: Olivia Ross       Date: 06/27/2023 DOB: [birth year]  Age: 78 y.o. MRN#: [id number] Attending Physician: Narda Bonds, MD Primary Care Physician: Stevphen Rochester, MD Admit Date: 06/14/2023  Reason for Consultation/Follow-up: Establishing goals of care  Subjective: I have reviewed medical records including EPIC notes, MAR, any available advanced directives as necessary, and labs. Received report from primary RN - no acute concerns. Patient atr a little bit this morning. Received 1 PRN dose of morphine. No family present during my visit.  Called patient's daughter [name] for ongoing goals of care discussions and palliative support.  We reviewed patient's interval history since our last discussion as well as her conversation with my colleague.  Her understanding is that patient still needs to be seen by neurology for her ongoing concern with sedation and antiepileptics.  [name] shares that after discussion with primary attending, she understands patient's ileus is not a major cause of concern and will be monitored.  We also discussed the possibility of inaccurate glucose readings due to issues with IV access and arm swelling.  She is relieved that something seems to be going better.  Emotional support and therapeutic listening was provided.  Goals of care are clear at this time and daughter is hopeful that patient will be able to discharge home soon to maximize quality of life and minimize risks associated with prolonged hospitalization.   Physical Exam Vitals and nursing note reviewed.  Constitutional:      General: She is sleeping. She is not in acute distress.    Appearance: She is ill-appearing.  Cardiovascular:     Rate and Rhythm: Normal rate.  Pulmonary:     Effort: No respiratory  distress.  Skin:    General: Skin is warm and dry.  Neurological:     Motor: Weakness present.             Vital Signs: BP (!) 141/63 (BP Location: Right Wrist)   Pulse 77   Temp 98.3 F (36.8 C) (Oral)   Resp 14   Ht 5\' 3"  (1.6 m)   Wt 45.5 kg   SpO2 100%   BMI 17.77 kg/m  SpO2: SpO2: 100 % O2 Device: O2 Device: Room Air O2 Flow Rate: O2 Flow Rate (L/min): 1 L/min       Palliative Assessment/Data: PPS 10-20%    Palliative Care Assessment & Plan   Patient Profile: 78 y.o. female  with past medical history of dementia, Right MCA stroke 2022, Lt MCA Stroke 11/23, HTN, HLD who was BIB EMS for Code  Stroke, left gaze and aphasic, ?h/o seizures admitted on 06/14/2023 with concern for stroke/seizure and UTI   Patient was admitted for acute hypoxic respiratory failure and vent management after becoming apneic en route to the hospital and intubated in the ED.  She also had a seizure en route to the hospital and was treated accordingly. PMT has been consulted to assist with goals of care conversation.  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 She is aware the balance of trying to avoid clinical seizure recurrence as well as minimize sedation sometimes cannot be met Appreciate RN assistance with listing dairy allergy/avoiding further dairy products on meal trays Prognosis remains tenuous Psychosocial and emotional support provided PMT will continue to follow and support holistically   Prognosis:  Poor  Discharge Planning: To Be Determined  Care plan was discussed with primary RN, patient's daughter  Total time: I spent 35 minutes in the care of the patient today in  the above activities and documenting the encounter.   Richardson Dopp, PA-C Palliative Medicine Team Team phone # [phone]  Thank you for allowing the Palliative Medicine Team to assist in the care of this patient. Please utilize secure chat with additional questions, if there is no response within 30 minutes please call the above phone number.  Palliative Medicine Team providers are available by phone from 7am to 7pm daily and can be reached through the team cell phone.  Should this patient require assistance outside of these hours, please call the patient's attending physician.  Portions of this note are a verbal dictation therefore any spelling and/or grammatical errors are due to the "[employer]" system interpretation.

## 2023-06-27 NOTE — Progress Notes (Signed)
 Patient assessed for midline catheter for IV access.  Left arm is significantly swollen.  Right arm with vessels that are non compressible and too small for either PICC or Midline.  20g catheter vessel ratio noted to be 52%.  PIV was able to be placed in right forearm.  Bedside nurse Lelon Mast and Dr. Caleb Popp notified.

## 2023-06-28 DIAGNOSIS — R569 Unspecified convulsions: Secondary | ICD-10-CM | POA: Diagnosis not present

## 2023-06-28 DIAGNOSIS — Z789 Other specified health status: Secondary | ICD-10-CM | POA: Diagnosis not present

## 2023-06-28 DIAGNOSIS — Z711 Person with feared health complaint in whom no diagnosis is made: Secondary | ICD-10-CM | POA: Diagnosis not present

## 2023-06-28 DIAGNOSIS — E876 Hypokalemia: Secondary | ICD-10-CM | POA: Diagnosis not present

## 2023-06-28 LAB — GLUCOSE, CAPILLARY
Glucose-Capillary: 75 mg/dL (ref 70–99)
Glucose-Capillary: 43 mg/dL — CL (ref 70–99)
Glucose-Capillary: 52 mg/dL — ABNORMAL LOW (ref 70–99)
Glucose-Capillary: <10 mg/dL — CL (ref 70–99)
Glucose-Capillary: 97 mg/dL (ref 70–99)
Glucose-Capillary: 117 mg/dL — ABNORMAL HIGH (ref 70–99)
Glucose-Capillary: 104 mg/dL — ABNORMAL HIGH (ref 70–99)
Glucose-Capillary: 77 mg/dL (ref 70–99)

## 2023-06-28 LAB — MAGNESIUM: Magnesium: 1.7 mg/dL (ref 1.7–2.4)

## 2023-06-28 NOTE — Plan of Care (Signed)
   Problem: Nutritional: Goal: Maintenance of adequate nutrition will improve Outcome: Progressing   Problem: Skin Integrity: Goal: Risk for impaired skin integrity will decrease Outcome: Progressing   Problem: Tissue Perfusion: Goal: Adequacy of tissue perfusion will improve Outcome: Progressing

## 2023-06-28 NOTE — Progress Notes (Signed)
 PROGRESS NOTE    Olivia Ross  WUJ:[id number] DOB: [birth year] DOA: 06/14/2023 PCP: Stevphen Rochester, MD   Brief Narrative: Olivia Ross is a 78 y.o. female with a history of vascular dementia, multiple strokes, hypertension, hyperlipidemia.  Patient presented secondary to code stroke with left gaze deviation aphasia and possible seizures.  Neurology was consulted on admission and started on Keppra.  Patient required intubation and ICU admission on admission with extubation same day.  MRI brain negative for acute stroke.  Hospitalization complicated by ongoing poor oral intake with ongoing goals of care discussions.   Assessment and Plan:  Seizure Neurology consulted.  LTM EEG was negative for ongoing seizures and MRI was negative for acute processes to explain seizures.  Patient was initially managed on Keppra but was transitioned to valproic acid secondary to concern of lethargy. 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  Findings discussed with family with recommendations and decision to not pursue ongoing workup or to pursue management secondary to patient's underlying comorbidities.  Palliative care consulted for ongoing goals of care.  Poor oral intake Unclear if this is from acute illness versus related to progressive worsening in setting of multiple chronic illnesses and underlying dementia.  Family is concerned that medication effect could be worsening mental status.  Medications adjusted to minimize effect on mental status.  Palliative care medicine also consulted for help with goals of care discussions.  At this time, focus is for quality of life, however ongoing goals of care discussions are still being pursued. -Continue IV fluids  Left  arm swelling Likely related to malfunctioning IV. -Check upper extremity venous duplex -Remove IV  Acute respiratory failure with hypoxia Patient required intubation on admission but was quickly extubated same day.  Currently on room air.  Respiratory failure resolved.  Hypoglycemia Likely related to poor oral intake in addition to underlying poor nutrition with notable cachectic body habitus with associated underweight BMI.  C-peptide elevated with concern hypoglycemia could be insulin mediated. Patient managed with D50 in addition to D5 IV fluids. Incongruence between CBGs from left and right hand, possibly related to significant left upper extremity edema. -Continue D5 IV fluids -Continue octreotide -Check CBGs on right hand only  Ileus Noted on x-ray. No nausea/vomiting. Abdomen is non-distended and non-tender today. Patient having bowel movements.  Hypokalemia Resolved with repletion  Hypomagnesemia Magnesium of 1.6 today -Magnesium supplementation -Repeat magnesium in a.m. major  Vascular dementia Noted.  Complicating current prognosis.  Anemia of chronic disease Hemoglobin of 12.2 on admission with immediate downtrend to of 9.8.  Hemoglobin has been stable around 9-10 with acute drop to 7.7. Hemoglobin stabile.  No obvious evidence of bleeding at this time.  Thrombocytopenia Unclear etiology.  Possibly related to acute illness. Resolved.  Oropharyngeal dysphagia Patient evaluated by speech therapy. -Speech therapy recommendations (3/21): Diet recommendations: Dysphagia 1 (puree);Thin liquid Liquids provided via: Cup;Straw Medication Administration: Crushed with puree Supervision: Full supervision/cueing for compensatory strategies Compensations: Slow rate;Small sips/bites Postural Changes and/or Swallow Maneuvers: Seated upright 90 degrees  Elevated AST/ALT Mild.  MRI of the abdomen without evidence of hepatic injury/disease.  No obvious biliary obstruction.   Normal bilirubin.  Severe malnutrition -Dietitian recommendations (3/24): Continue Ensure Enlive po BID, each supplement provides 350 kcal and 20 grams  of protein. Encourage caloric beverages such as juice.  Order [employer] with dinner.  MVI with minerals and folic acid.   Goals of care Palliative care consulted for assistance in goals of care discussions.  Patient is currently full code.  Long discussion with daughter via telephone.  Per daughter, focus is comfort and quality of life.  She wants to focus on patient eating better to allow for better control of her blood sugar.  She believes that the antiepileptic drug treatment is contributing to patient's lethargy and decreased ability to take p.o.  When discussing the pancreatic mass with concern for cancer, daughter states that she agrees that workup and management would likely contribute negatively towards quality of life.  We also discussed CODE STATUS in addition to how much to do and how much not to do from his [place] as relates to her medical care, for which patient's daughter stated that she still needed time to think about this.  Underweight Estimated body mass index is 17.77 kg/m as calculated from the following:   Height as of this encounter: 5\' 3"  (1.6 m).   Weight as of this encounter: 45.5 kg.    DVT prophylaxis: Subcutaneous heparin Code Status:   Code Status: Full Code Family Communication: None at bedside Disposition Plan: Discharge pending ongoing goals of care discussion/recommendations/plans and stable blood glucose levels   Consultants:  Neurology Palliative care medicine  Procedures:  LTM EEG Intubation/extubation  Antimicrobials: Vancomycin Cefepime Flagyl   Subjective: No concerns. States no abdominal pain.  Objective: BP (!) 150/75 (BP Location: Right Arm)   Pulse 86   Temp (!) 97.4 F (36.3 C) (Axillary)   Resp 16   Ht 5\' 3"  (1.6 m)   Wt 45.5 kg   SpO2 100%   BMI 17.77 kg/m    Examination:  General exam: Appears calm and comfortable Respiratory system: Clear to auscultation. Respiratory effort normal. Cardiovascular system: S1 & S2 heard, RRR. Gastrointestinal system: Abdomen is nondistended, soft and nontender. Normal bowel sounds heard. Central nervous system: Alert. Musculoskeletal: 2+ LUE pitting edema and 1+ right hand pitting edema.   Data Reviewed: I have personally reviewed following labs and imaging studies  CBC Lab Results  Component Value Date   WBC 5.7 06/27/2023   RBC 2.43 (L) 06/27/2023   HGB 8.0 (L) 06/27/2023   HCT 24.9 (L) 06/27/2023   MCV 102.5 (H) 06/27/2023   MCH 32.9 06/27/2023   PLT 201 06/27/2023   MCHC 32.1 06/27/2023   RDW 15.7 (H) 06/27/2023   LYMPHSABS 1.0 06/25/2023   MONOABS 0.4 06/25/2023   EOSABS 0.1 06/25/2023   BASOSABS 0.0 06/25/2023     Last metabolic panel Lab Results  Component Value Date   NA 138 06/27/2023   K 4.5 06/27/2023   CL 106 06/27/2023   CO2 26 06/27/2023   BUN 17 06/27/2023   CREATININE 0.51 06/27/2023   GLUCOSE 103 (H) 06/27/2023   GFRNONAA >60 06/27/2023   CALCIUM 8.7 (L) 06/27/2023   PHOS 1.3 (L) 06/18/2023   PROT 4.6 (L) 06/27/2023   ALBUMIN 2.2 (L) 06/27/2023   BILITOT 0.6 06/27/2023   ALKPHOS 86 06/27/2023   AST 16 06/27/2023   ALT 23 06/27/2023   ANIONGAP 6 06/27/2023    GFR: Estimated Creatinine Clearance: 41 mL/min (by C-G formula based on SCr of 0.51 mg/dL).  No results found for this or any previous visit (from the past 240 hours).    Radiology Studies: VAS [place] UPPER EXTREMITY VENOUS DUPLEX Result  Date: 06/27/2023 UPPER VENOUS STUDY  Patient Name:  KENNY STERN  Date of Exam:   06/27/2023 Medical Rec #: [id number]       Accession #:    [id number] Date of Birth: 1945-09-11       Patient Gender: F Patient Age:   23 years Exam Location:  [facility] Procedure:      VAS [place] UPPER EXTREMITY VENOUS DUPLEX Referring Phys: Denica Web  --------------------------------------------------------------------------------  Indications: Edema, and Swelling Limitations: Position of patient in bed. Technically difficult to fully evaluate left upper extremity and right subc vein. Comparison Study: No priors. Performing Technologist: Bayard Sink Sturdivant-Jones RDMS, RVT  Examination Guidelines: A complete evaluation includes B-mode imaging, spectral Doppler, color Doppler, and power Doppler as needed of all accessible portions of each vessel. Bilateral testing is considered an integral part of a complete examination. Limited examinations for reoccurring indications may be performed as noted.  Left Findings: +----------+------------+---------+-----------+----------+-------+ LEFT      CompressiblePhasicitySpontaneousPropertiesSummary +----------+------------+---------+-----------+----------+-------+ IJV           Full       Yes       Yes                      +----------+------------+---------+-----------+----------+-------+ Subclavian    Full       Yes       Yes                      +----------+------------+---------+-----------+----------+-------+ Axillary      Full       Yes       Yes                      +----------+------------+---------+-----------+----------+-------+ Brachial      Full                                          +----------+------------+---------+-----------+----------+-------+ Radial        Full                                          +----------+------------+---------+-----------+----------+-------+ Ulnar         Full                                          +----------+------------+---------+-----------+----------+-------+ Cephalic      Full                                          +----------+------------+---------+-----------+----------+-------+ Basilic       Full                                          +----------+------------+---------+-----------+----------+-------+   Summary:  Left: No evidence of deep vein thrombosis in the upper extremity. No evidence of superficial vein thrombosis in the upper extremity.  *See table(s) above for measurements and observations.  Diagnosing physician: Carolynn Sayers Electronically signed by Carolynn Sayers on 06/27/2023 at 1:41:46 PM.  Final    [place] EKG SITE RITE Result Date: 06/27/2023 If [facility] image not attached, placement could not be confirmed due to current cardiac rhythm.  DG Abd Portable 1V Result Date: 06/26/2023 CLINICAL DATA:  Abdominal pain EXAM: PORTABLE ABDOMEN - 1 VIEW COMPARISON:  CT abdomen and pelvis 06/15/2023 FINDINGS: Gas-filled nondistended large and small bowel. Appearances are likely to represent ileus. No radiopaque stones are demonstrated. Surgical clips in the right upper quadrant. Vascular calcification in the aorta. Lung bases are clear. Degenerative changes in the spine and hips. Patient rotation limits examination. IMPRESSION: Gas-filled nondistended small and large bowel likely representing ileus. Electronically Signed   By: Burman Nieves M.D.   On: 06/26/2023 19:47      LOS: 14 days    Jacquelin Hawking, MD [facility] 06/28/2023, 2:07 PM   If 7PM-7AM, please contact night-coverage [email or web address]

## 2023-06-29 DIAGNOSIS — E876 Hypokalemia: Secondary | ICD-10-CM | POA: Diagnosis not present

## 2023-06-29 DIAGNOSIS — Z515 Encounter for palliative care: Secondary | ICD-10-CM | POA: Diagnosis not present

## 2023-06-29 DIAGNOSIS — Z7189 Other specified counseling: Secondary | ICD-10-CM | POA: Diagnosis not present

## 2023-06-29 DIAGNOSIS — F0390 Unspecified dementia without behavioral disturbance: Secondary | ICD-10-CM | POA: Diagnosis not present

## 2023-06-29 LAB — GLUCOSE, CAPILLARY
Glucose-Capillary: 102 mg/dL — ABNORMAL HIGH (ref 70–99)
Glucose-Capillary: 132 mg/dL — ABNORMAL HIGH (ref 70–99)
Glucose-Capillary: 22 mg/dL — CL (ref 70–99)
Glucose-Capillary: 26 mg/dL — CL (ref 70–99)
Glucose-Capillary: 30 mg/dL — CL (ref 70–99)
Glucose-Capillary: 38 mg/dL — CL (ref 70–99)
Glucose-Capillary: 61 mg/dL — ABNORMAL LOW (ref 70–99)
Glucose-Capillary: 68 mg/dL — ABNORMAL LOW (ref 70–99)
Glucose-Capillary: 76 mg/dL (ref 70–99)
Glucose-Capillary: 84 mg/dL (ref 70–99)
Glucose-Capillary: 87 mg/dL (ref 70–99)
Glucose-Capillary: 95 mg/dL (ref 70–99)

## 2023-06-29 LAB — GLUCOSE, RANDOM
Glucose, Bld: 69 mg/dL — ABNORMAL LOW (ref 70–99)
Glucose, Bld: 96 mg/dL (ref 70–99)

## 2023-06-29 NOTE — Progress Notes (Incomplete)
 PROGRESS NOTE    Olivia Ross  ZOX:[id number] DOB: Oct 29, 1945 DOA: 06/14/2023 PCP: Stevphen Rochester, MD   Brief Narrative: Olivia Ross is a 78 y.o. female with a history of vascular dementia, multiple strokes, hypertension, hyperlipidemia.  Patient presented secondary to code stroke with left gaze deviation aphasia and possible seizures.  Neurology was consulted on admission and started on Keppra.  Patient required intubation and ICU admission on admission with extubation same day.  MRI brain negative for acute stroke.  Hospitalization complicated by ongoing poor oral intake with ongoing goals of care discussions.   Assessment and Plan:  Seizure Neurology consulted.  LTM EEG was negative for ongoing seizures and MRI was negative for acute processes to explain seizures.  Patient was initially managed on Keppra but was transitioned to valproic acid secondary to concern of lethargy. -Continue valproic acid -Continue seizure precautions -Re-consult neurology as patient's daughter is concerned about excessive sedation.  Pancreatic mass CT imaging with mass measuring 2.5 cm in the right upper quadrant of the pancreas with follow-up MRI of the abdomen is significant for 2.2 x 2.1 cm lesion of the pancreatic head with raise concern for neoplasm.  Findings discussed with family with recommendations and decision to not pursue ongoing workup or to pursue management secondary to patient's underlying comorbidities.  Palliative care consulted for ongoing goals of care.  Poor oral intake Unclear if this is from acute illness versus related to progressive worsening in setting of multiple chronic illnesses and underlying dementia.  Family is concerned that medication effect could be worsening mental status.  Medications adjusted to minimize effect on mental status.  Palliative care medicine also consulted for help with goals of care discussions.  At this time, focus is for quality of life, however  ongoing goals of care discussions are still being pursued.  Left arm swelling Likely related to malfunctioning IV. Improved with removal of IV. No upper extremity DVT noted.  Acute respiratory failure with hypoxia Patient required intubation on admission but was quickly extubated same day.  Currently on room air.  Respiratory failure resolved.  Hypoglycemia Likely related to poor oral intake in addition to underlying poor nutrition with notable cachectic body habitus with associated underweight BMI.  C-peptide elevated with concern hypoglycemia could be insulin mediated. Patient managed with D50 in addition to D5 IV fluids. Incongruence between CBGs from left and right hand, possibly related to significant left upper extremity edema. -Continue octreotide -Check blood glucose q4 hours via blood draw per daughter request  Ileus Noted on x-ray. No nausea/vomiting. Abdomen is non-distended and non-tender today. Patient having bowel movements.  Hypokalemia Resolved with repletion  Hypomagnesemia Magnesium of 1.5 today -Magnesium supplementation -Repeat magnesium in a.m.  Vascular dementia Noted.  Complicating current prognosis.  Anemia of chronic disease Hemoglobin of 12.2 on admission with immediate downtrend to of 9.8.  Hemoglobin has been stable around 9-10 with acute drop to 7.7. Hemoglobin stabile.  No obvious evidence of bleeding at this time.  Thrombocytopenia Unclear etiology.  Possibly related to acute illness. Resolved.  Oropharyngeal dysphagia Patient evaluated by speech therapy. -Speech therapy recommendations (3/21): Diet recommendations: Dysphagia 1 (puree);Thin liquid Liquids provided via: Cup;Straw Medication Administration: Crushed with puree Supervision: Full supervision/cueing for compensatory strategies Compensations: Slow rate;Small sips/bites Postural Changes and/or Swallow Maneuvers: Seated upright 90 degrees  Elevated AST/ALT Mild.  MRI of the abdomen  without evidence of hepatic injury/disease.  No obvious biliary obstruction.  Normal bilirubin.  Severe malnutrition -Dietitian recommendations (3/24): Continue Ensure [employer]  po BID, each supplement provides 350 kcal and 20 grams of protein. Encourage caloric beverages such as juice.  Order [employer] with dinner.  MVI with minerals and folic acid.   Goals of care Palliative care consulted for assistance in goals of care discussions.  Patient is currently full code.  Long discussion with daughter via telephone.  Per daughter, focus is comfort and quality of life.  She wants to focus on patient eating better to allow for better control of her blood sugar.  She believes that the antiepileptic drug treatment is contributing to patient's lethargy and decreased ability to take p.o.  When discussing the pancreatic mass with concern for cancer, daughter states that she agrees that workup and management would likely contribute negatively towards quality of life.  We also discussed CODE STATUS in addition to how much to do and how much not to do from his [place] as relates to her medical care, for which patient's daughter stated that she still needed time to think about this.  Underweight Estimated body mass index is 17.77 kg/m as calculated from the following:   Height as of this encounter: 5\' 3"  (1.6 m).   Weight as of this encounter: 45.5 kg.   DVT prophylaxis: Subcutaneous heparin Code Status:   Code Status: Full Code Family Communication: None at bedside. Daughter on telephone Disposition Plan: Discharge pending ongoing goals of care discussion/recommendations/plans and stable blood glucose levels   Consultants:  Neurology Palliative care medicine  Procedures:  LTM EEG Intubation/extubation  Antimicrobials: Vancomycin Cefepime Flagyl   Subjective: Patient states she does not feel well but will not expound. Daughter concerned about excessive sedation from Depakote.  Objective: BP  (!) 150/62 (BP Location: Right Arm)   Pulse 77   Temp 97.8 F (36.6 C) (Axillary)   Resp 16   Ht 5\' 3"  (1.6 m)   Wt 45.5 kg   SpO2 100%   BMI 17.77 kg/m   Examination:  General exam: Appears calm and comfortable. Cachectic. Respiratory system: Clear to auscultation. Respiratory effort normal. Cardiovascular system: S1 & S2 heard, RRR. No murmurs. Gastrointestinal system: Abdomen is nondistended, soft and nontender. Normal bowel sounds heard. Central nervous system: Alert. Musculoskeletal: Right hand edema. No calf tenderness   Data Reviewed: I have personally reviewed following labs and imaging studies  CBC Lab Results  Component Value Date   WBC 5.7 06/27/2023   RBC 2.43 (L) 06/27/2023   HGB 8.0 (L) 06/27/2023   HCT 24.9 (L) 06/27/2023   MCV 102.5 (H) 06/27/2023   MCH 32.9 06/27/2023   PLT 201 06/27/2023   MCHC 32.1 06/27/2023   RDW 15.7 (H) 06/27/2023   LYMPHSABS 1.0 06/25/2023   MONOABS 0.4 06/25/2023   EOSABS 0.1 06/25/2023   BASOSABS 0.0 06/25/2023     Last metabolic panel Lab Results  Component Value Date   NA 138 06/27/2023   K 4.5 06/27/2023   CL 106 06/27/2023   CO2 26 06/27/2023   BUN 17 06/27/2023   CREATININE 0.51 06/27/2023   GLUCOSE 103 (H) 06/27/2023   GFRNONAA >60 06/27/2023   CALCIUM 8.7 (L) 06/27/2023   PHOS 1.3 (L) 06/18/2023   PROT 4.6 (L) 06/27/2023   ALBUMIN 2.2 (L) 06/27/2023   BILITOT 0.6 06/27/2023   ALKPHOS 86 06/27/2023   AST 16 06/27/2023   ALT 23 06/27/2023   ANIONGAP 6 06/27/2023    GFR: Estimated Creatinine Clearance: 41 mL/min (by C-G formula based on SCr of 0.51 mg/dL).  No results found for  this or any previous visit (from the past 240 hours).    Radiology Studies: VAS [place] UPPER EXTREMITY VENOUS DUPLEX Result Date: 06/27/2023 UPPER VENOUS STUDY  Patient Name:  Olivia Ross  Date of Exam:   06/27/2023 Medical Rec #: [id number]       Accession #:    [id number] Date of Birth: 1945/08/28       Patient Gender: F  Patient Age:   58 years Exam Location:  [facility] Procedure:      VAS [place] UPPER EXTREMITY VENOUS DUPLEX Referring Phys: Elainah Rhyne --------------------------------------------------------------------------------  Indications: Edema, and Swelling Limitations: Position of patient in bed. Technically difficult to fully evaluate left upper extremity and right subc vein. Comparison Study: No priors. Performing Technologist:  Sink Sturdivant-Jones RDMS, RVT  Examination Guidelines: A complete evaluation includes B-mode imaging, spectral Doppler, color Doppler, and power Doppler as needed of all accessible portions of each vessel. Bilateral testing is considered an integral part of a complete examination. Limited examinations for reoccurring indications may be performed as noted.  Left Findings: +----------+------------+---------+-----------+----------+-------+ LEFT      CompressiblePhasicitySpontaneousPropertiesSummary +----------+------------+---------+-----------+----------+-------+ IJV           Full       Yes       Yes                      +----------+------------+---------+-----------+----------+-------+ Subclavian    Full       Yes       Yes                      +----------+------------+---------+-----------+----------+-------+ Axillary      Full       Yes       Yes                      +----------+------------+---------+-----------+----------+-------+ Brachial      Full                                          +----------+------------+---------+-----------+----------+-------+ Radial        Full                                          +----------+------------+---------+-----------+----------+-------+ Ulnar         Full                                          +----------+------------+---------+-----------+----------+-------+ Cephalic      Full                                          +----------+------------+---------+-----------+----------+-------+  Basilic       Full                                          +----------+------------+---------+-----------+----------+-------+  Summary:  Left: No evidence of deep vein thrombosis in the upper extremity. No evidence of superficial vein thrombosis in the upper extremity.  *See  table(s) above for measurements and observations.  Diagnosing physician: Carolynn Sayers Electronically signed by Carolynn Sayers on 06/27/2023 at 1:41:46 PM.    Final    [place] EKG SITE RITE Result Date: 06/27/2023 If Site Rite image not attached, placement could not be confirmed due to current cardiac rhythm.     LOS: 15 days    Jacquelin Hawking, MD [facility] 06/29/2023, 9:06 AM   If 7PM-7AM, please contact night-coverage [email or web address]

## 2023-06-29 NOTE — Evaluation (Signed)
 Clinical/Bedside Swallow Evaluation Patient Details  Name: Olivia Ross MRN: [id number] Date of Birth: 22-Jun-1945  Today's Date: 06/29/2023 Time: SLP Start Time (ACUTE ONLY): 1440 SLP Stop Time (ACUTE ONLY): 1510 SLP Time Calculation (min) (ACUTE ONLY): 30 min  Past Medical History:  Past Medical History:  Diagnosis Date   Breast cancer (HCC)    CVA (cerebral vascular accident) (HCC)    Dementia (HCC)    Hypertension    Seizure (HCC) 07/2020   Past Surgical History: History reviewed. No pertinent surgical history. 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  Family thought she may have UTI.  Patient initially alert then had a seizure en route to hospital.  EMS gave 5mg  IM Versed, but then she became apneic and she was intubated (Etomidate and Succinylcholine) in the ED.  She was diagnosed with hypoxic respiratory failure requiring intubation, seizure disorder, lactic acidosis and hypokalemia.  Most recent CT of the head was showing no acute findings with multiple chronic infarcts.    Assessment / Plan / Recommendation  Clinical Impression  SLP reconsulted; daughter at bedside reports she is trying to find ways to balance pts treatment and QOL. Her priamry concern if Olivia Ross increased lethargy that she suspects if from medications. Despite this pt has been eating well at meals; ate breakfast today and some lunch. Dtr is hoping to offer her some of her favorite minced and moist foods since she has said she is tired of some of the purees.   Today pt is too altered to communicate to daughter or SLP. Her gaze is un focused and she is orally rooting against her covers. Dtr reports that she was more alert earlier and her mentation is worse after her medications. Despite poor mentation today pt accepted sips from a straw with no signs of aspiration. She did have prolonged oral  manipulation of puree and some oral holding requiring suction.   Recommended to daughter that pt remain on a puree and thin liquid diet for meals since she has tolerated this and she is inconsistently alert. However, daughter is very aware of Olivia Ross abilities and recognizes lethargy appropriately. She is familiar with feeding her and how to prepare minced and moist foods. Daughter can bring favorite foods from home to supplement pts puree diet when pt is maximally alert. Talked to her about looking for oral holding, using suction to check. Will check in with daughter about needs. SLP Visit Diagnosis: Dysphagia, unspecified (R13.10)    Aspiration Risk  Risk for inadequate nutrition/hydration    Diet Recommendation Thin liquid;Dysphagia 1 (Puree)    Liquid Administration via: Straw Medication Administration: Crushed with puree Supervision: Full supervision/cueing for compensatory strategies Compensations: Slow rate;Small sips/bites;Lingual sweep for clearance of pocketing Postural Changes: Seated upright at 90 degrees    Other  Recommendations Oral Care Recommendations: Oral care BID Caregiver Recommendations: Have oral suction available    Recommendations for follow up therapy are one component of a multi-disciplinary discharge planning process, led by the attending physician.  Recommendations may be updated based on patient status, additional functional criteria and insurance authorization.  Follow up Recommendations Skilled nursing-short term rehab (<3 hours/day)      Assistance Recommended at Discharge    Functional Status Assessment Patient has had a recent decline in their functional status and demonstrates the ability to make significant improvements in function  in a reasonable and predictable amount of time.  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  Family thought she may have UTI.  Patient initially alert then had a seizure en route to hospital.  EMS gave 5mg  IM Versed, but then she became apneic and she was intubated (Etomidate and Succinylcholine) in the ED.  She was diagnosed with hypoxic respiratory failure requiring intubation, seizure disorder, lactic acidosis and hypokalemia.  Most recent CT of the head was showing no acute findings with multiple chronic infarcts. Type of Study: Bedside Swallow Evaluation Temperature Spikes Noted: No Respiratory Status: Room air History of Recent Intubation: Yes Total duration of intubation (days):  (9 hours) Date extubated: 06/14/23 Behavior/Cognition: Alert;Doesn't follow directions Oral Cavity Assessment: Within Functional Limits Oral Care Completed by SLP: No Oral Cavity - Dentition: Poor condition;Missing dentition Vision: Impaired for self-feeding Self-Feeding Abilities: Total assist Patient Positioning: Partially reclined Baseline Vocal Quality: Not observed Volitional Cough: Cognitively unable to elicit Volitional Swallow: Unable to elicit    Oral/Motor/Sensory Function Overall Oral Motor/Sensory Function: Other (comment) (oral rooting)   Ice Chips     Thin Liquid Thin Liquid: Impaired Presentation: Straw Oral Phase Impairments: Poor awareness of bolus    Nectar Thick Nectar Thick Liquid: Not tested   Honey Thick Honey Thick Liquid: Not tested   Puree Puree: Impaired Oral Phase Impairments: Poor awareness of bolus Oral Phase Functional Implications: Prolonged oral transit;Oral residue   Solid     Solid: Not tested      Olivia Ross, Olivia Ross 06/29/2023,3:40 PM

## 2023-06-29 NOTE — Progress Notes (Signed)
 Daily Progress Note   Patient Name: Olivia Ross       Date: 06/29/2023 DOB: 1945-08-30  Age: 78 y.o. MRN#: [id number] Attending Physician: Narda Bonds, MD Primary Care Physician: Stevphen Rochester, MD Admit Date: 06/14/2023  Reason for Consultation/Follow-up: Establishing goals of care  Subjective: I have reviewed medical records including progress notes, labs, imaging.  Patient assessed at bedside.  She is sleeping.  Did not arouse in order to preserve comfort.  No family present during visit.  Called patient's daughter [name] for ongoing palliative support.  Provided update on overnight glucose readings.  Created space and opportunity for her thoughts and feelings on patient's current illness.  She is concerned that fingersticks continue to show false readings and while patient may be agitated by blood draws for glucose checks, she feels this may be more accurate and beneficial in determining whether patient is stable and can be discharged soon.  I shared that I would speak with Dr. Caleb Popp about this.  Emotional support and therapeutic listening was provided.  She again states that goals of care remain consistent as we previously have discussed.  Questions and concerns addressed.  PMT will continue to follow and support holistically.   Physical Exam Vitals and nursing note reviewed.  Constitutional:      General: She is sleeping. She is not in acute distress.    Appearance: She is ill-appearing.  Cardiovascular:     Rate and Rhythm: Normal rate.  Pulmonary:     Effort: No respiratory distress.  Skin:    General: Skin is warm and dry.  Neurological:     Motor: Weakness present.             Vital Signs: BP (!) 150/62 (BP Location: Right Arm)   Pulse 77   Temp 97.8 F (36.6 C) (Axillary)   Resp 16   Ht 5\' 3"  (1.6 m)    Wt 45.5 kg   SpO2 100%   BMI 17.77 kg/m  SpO2: SpO2: 100 % O2 Device: O2 Device: Room Air O2 Flow Rate: O2 Flow Rate (L/min): 1 L/min       Palliative Assessment/Data: PPS 30%     Palliative Care Assessment & Plan   Patient Profile: 78 y.o. 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  She also had a seizure en route to the hospital and was treated accordingly. PMT has been consulted to assist with goals of care conversation.  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 She is aware the balance of trying to avoid clinical seizure recurrence as well as minimize sedation sometimes cannot be met Discussed daughter's concerns regarding glucose checks with primary attending Psychosocial and emotional support provided PMT will continue to follow incrementally   Prognosis: Tenuous  Discharge Planning: Home with Palliative Services  Care plan was discussed with patient's daughter, MD  Total time: I spent 35 minutes in the care of the patient today in the above activities and documenting the encounter.   Richardson Dopp, PA-C Palliative Medicine Team Team phone # [phone]  Thank you for allowing the Palliative Medicine Team to assist in the care of this patient. Please utilize secure chat with additional questions, if there is no  response within 30 minutes please call the above phone number.  Palliative Medicine Team providers are available by phone from 7am to 7pm daily and can be reached through the team cell phone.  Should this patient require assistance outside of these hours, please call the patient's attending physician.  Portions of this note are a verbal dictation therefore any spelling and/or grammatical errors are due to the "Dragon [employer] One" system interpretation.

## 2023-06-29 NOTE — Progress Notes (Signed)
 PROGRESS NOTE    Olivia Ross  ZOX:[id number] DOB: 16-Jan-1946 DOA: 06/14/2023 PCP: Stevphen Rochester, MD   Brief Narrative: Olivia Ross is a 78 y.o. female with a history of vascular dementia, multiple strokes, hypertension, hyperlipidemia.  Patient presented secondary to code stroke with left gaze deviation aphasia and possible seizures.  Neurology was consulted on admission and started on Keppra.  Patient required intubation and ICU admission on admission with extubation same day.  MRI brain negative for acute stroke.  Hospitalization complicated by ongoing poor oral intake with ongoing goals of care discussions.   Assessment and Plan:  Seizure Neurology consulted.  LTM EEG was negative for ongoing seizures and MRI was negative for acute processes to explain seizures.  Patient was initially managed on Keppra but was transitioned to valproic acid secondary to concern of lethargy. 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  Findings discussed with family with recommendations and decision to not pursue ongoing workup or to pursue management secondary to patient's underlying comorbidities.  Palliative care consulted for ongoing goals of care.  Poor oral intake Unclear if this is from acute illness versus related to progressive worsening in setting of multiple chronic illnesses and underlying dementia.  Family is concerned that medication effect could be worsening mental status.  Medications adjusted to minimize effect on mental status.  Palliative care medicine also consulted for help with goals of care discussions.  At this time, focus is for quality of life, however ongoing goals of care discussions are still being pursued. -Hold IV fluids -Continue  dysphagia diet  Left arm swelling Likely related to malfunctioning IV. Venous duplex negative for DVT. Improving off of IV fluids.  Acute respiratory failure with hypoxia Patient required intubation on admission but was quickly extubated same day.  Currently on room air.  Respiratory failure resolved.  Hypoglycemia Likely related to poor oral intake in addition to underlying poor nutrition with notable cachectic body habitus with associated underweight BMI.  C-peptide elevated with concern hypoglycemia could be insulin mediated. Patient managed with D50 in addition to D5 IV fluids. Incongruence between CBGs from left and right hand, possibly related to significant left upper extremity edema; patient also with right hand edema. -Continue octreotide -Check glucose via blood draw q4 hours  Ileus Noted on x-ray. No nausea/vomiting. Abdomen is non-distended and non-tender today. Patient having bowel movements.  Hypokalemia Resolved with repletion  Hypomagnesemia Magnesium of 1.6 today -Magnesium supplementation -Repeat magnesium in a.m. major  Vascular dementia Noted.  Complicating current prognosis.  Anemia of chronic disease Hemoglobin of 12.2 on admission with immediate downtrend to of 9.8.  Hemoglobin has been stable around 9-10 with acute drop to 7.7. Hemoglobin stabile.  No obvious evidence of bleeding at this time.  Thrombocytopenia Unclear etiology.  Possibly related to acute illness. Resolved.  Oropharyngeal dysphagia Patient evaluated by speech therapy. -SLP for reevaluation -Speech therapy recommendations (3/25): Diet recommendations: Dysphagia 1 (puree);Thin liquid Liquids provided via: Cup;Straw Medication Administration: Crushed with puree Supervision: Full supervision/cueing for compensatory strategies Compensations: Slow rate;Small sips/bites Postural Changes and/or Swallow Maneuvers: Seated upright 90 degrees  Elevated AST/ALT Mild.  MRI of the abdomen without  evidence of hepatic injury/disease.  No obvious biliary obstruction.  Normal bilirubin.  Severe malnutrition -Dietitian recommendations (3/24): Continue  Ensure Enlive po BID, each supplement provides 350 kcal and 20 grams of protein. Encourage caloric beverages such as juice.  Order [employer] with dinner.  MVI with minerals and folic acid.   Goals of care Palliative care consulted for assistance in goals of care discussions.  Patient is currently full code.  Long discussion with daughter via telephone.  Per daughter, focus is comfort and quality of life.  She wants to focus on patient eating better to allow for better control of her blood sugar.  She believes that the antiepileptic drug treatment is contributing to patient's lethargy and decreased ability to take p.o.  When discussing the pancreatic mass with concern for cancer, daughter states that she agrees that workup and management would likely contribute negatively towards quality of life.  We also discussed CODE STATUS in addition to how much to do and how much not to do from his Juma as relates to her medical care, for which patient's daughter stated that she still needed time to think about this.  Underweight Estimated body mass index is 17.77 kg/m as calculated from the following:   Height as of this encounter: 5\' 3"  (1.6 m).   Weight as of this encounter: 45.5 kg.    DVT prophylaxis: Subcutaneous heparin Code Status:   Code Status: Full Code Family Communication: None at bedside. Daughter via telephone Disposition Plan: Discharge pending ongoing goals of care discussion/recommendations/plans and stable blood glucose levels   Consultants:  Neurology Palliative care medicine  Procedures:  LTM EEG Intubation/extubation  Antimicrobials: Vancomycin Cefepime Flagyl   Subjective: Hypoglycemia overnight requiring D50.  Objective: BP (!) 143/61 (BP Location: Right Arm)   Pulse 74   Temp 97.9 F (36.6 C) (Axillary)    Resp 14   Ht 5\' 3"  (1.6 m)   Wt 45.5 kg   SpO2 97%   BMI 17.77 kg/m   Examination:  General exam: Appears calm and comfortable. Cachectic. Chronically ill appearing. Respiratory system: Clear to auscultation. Respiratory effort normal. Cardiovascular system: S1 & S2 heard, RRR. No murmurs. Gastrointestinal system: Abdomen is nondistended, soft and nontender. Normal bowel sounds heard. Central nervous system: Alert Musculoskeletal: Right hand edema and left upper extremity pitting edema. No calf tenderness   Data Reviewed: I have personally reviewed following labs and imaging studies  CBC Lab Results  Component Value Date   WBC 5.7 06/27/2023   RBC 2.43 (L) 06/27/2023   HGB 8.0 (L) 06/27/2023   HCT 24.9 (L) 06/27/2023   MCV 102.5 (H) 06/27/2023   MCH 32.9 06/27/2023   PLT 201 06/27/2023   MCHC 32.1 06/27/2023   RDW 15.7 (H) 06/27/2023   LYMPHSABS 1.0 06/25/2023   MONOABS 0.4 06/25/2023   EOSABS 0.1 06/25/2023   BASOSABS 0.0 06/25/2023     Last metabolic panel Lab Results  Component Value Date   NA 138 06/27/2023   K 4.5 06/27/2023   CL 106 06/27/2023   CO2 26 06/27/2023   BUN 17 06/27/2023   CREATININE 0.51 06/27/2023   GLUCOSE 103 (H) 06/27/2023   GFRNONAA >60 06/27/2023   CALCIUM 8.7 (L) 06/27/2023   PHOS 1.3 (L) 06/18/2023   PROT 4.6 (L) 06/27/2023   ALBUMIN 2.2 (L) 06/27/2023   BILITOT 0.6 06/27/2023   ALKPHOS 86 06/27/2023   AST 16 06/27/2023   ALT 23 06/27/2023   ANIONGAP 6 06/27/2023    GFR: Estimated Creatinine Clearance: 41 mL/min (by C-G formula based on SCr of 0.51 mg/dL).  No results found for this or  any previous visit (from the past 240 hours).    Radiology Studies: No results found.     LOS: 15 days    Jacquelin Hawking, MD [facility] 06/29/2023, 12:31 PM   If 7PM-7AM, please contact night-coverage [email or web address]

## 2023-06-30 DIAGNOSIS — Z515 Encounter for palliative care: Secondary | ICD-10-CM | POA: Diagnosis not present

## 2023-06-30 DIAGNOSIS — Z789 Other specified health status: Secondary | ICD-10-CM | POA: Diagnosis not present

## 2023-06-30 DIAGNOSIS — R569 Unspecified convulsions: Secondary | ICD-10-CM | POA: Diagnosis not present

## 2023-06-30 DIAGNOSIS — E876 Hypokalemia: Secondary | ICD-10-CM | POA: Diagnosis not present

## 2023-06-30 LAB — GLUCOSE, CAPILLARY
Glucose-Capillary: 123 mg/dL — ABNORMAL HIGH (ref 70–99)
Glucose-Capillary: 17 mg/dL — CL (ref 70–99)
Glucose-Capillary: 18 mg/dL — CL (ref 70–99)
Glucose-Capillary: 190 mg/dL — ABNORMAL HIGH (ref 70–99)
Glucose-Capillary: 61 mg/dL — ABNORMAL LOW (ref 70–99)

## 2023-06-30 LAB — GLUCOSE, RANDOM
Glucose, Bld: 133 mg/dL — ABNORMAL HIGH (ref 70–99)
Glucose, Bld: 83 mg/dL (ref 70–99)
Glucose, Bld: 120 mg/dL — ABNORMAL HIGH (ref 70–99)
Glucose, Bld: 114 mg/dL — ABNORMAL HIGH (ref 70–99)
Glucose, Bld: 136 mg/dL — ABNORMAL HIGH (ref 70–99)

## 2023-06-30 LAB — MAGNESIUM: Magnesium: 1.5 mg/dL — ABNORMAL LOW (ref 1.7–2.4)

## 2023-06-30 MED ORDER — MAGNESIUM SULFATE 4 GM/100ML IV SOLN
4.0000 g | Freq: Once | INTRAVENOUS | Status: AC
  Administered 2023-06-30: 4 g via INTRAVENOUS
  Filled 2023-06-30: qty 100

## 2023-06-30 MED ORDER — KATE FARMS STANDARD 1.4 PO LIQD
325.0000 mL | Freq: Every day | ORAL | Status: DC
  Administered 2023-07-01 – 2023-07-03 (×3): 325 mL via ORAL
  Filled 2023-06-30 (×3): qty 325

## 2023-06-30 NOTE — Progress Notes (Signed)
 Daily Progress Note   Patient Name: Olivia Ross       Date: 06/30/2023 DOB: [birth year]  Age: 78 y.o. MRN#: [id number] Attending Physician: Narda Bonds, MD Primary Care Physician: Stevphen Rochester, MD Admit Date: 06/14/2023  Reason for Consultation/Follow-up: Establishing goals of care  Subjective: I have reviewed medical records including progress notes, labs, imaging.  Patient assessed at bedside. She is resting. Did not arouse in order to preserve comfort.   Received phone call from patient's daughter with request for advocacy and we dicussed her concerns including unclear discharge timeline, ongoing sedation/altered mental status which is attributed to Depakote, risks of prolonged hospitalization and proximity of patient's room to rooms across the hall with droplet precautions. Patient is unvaccinated. She is also more agitated in the afternoons after receiving Depakote compared to the morning.  Olivia Ross understands likelihood of worsening hospital delirium and dementia with prolonged time in an unfamiliar environment. Her hope is that patient can go home with adjustments to anti-seizure medications, though she understands that patient may need to be monitored for response if medications are changed. We reviewed normal blood glucose readings since blood draws were initiated in place of fingersticks. Discussed with MD. Informed Olivia Ross that I will off service tomorrow but that she could call PMT with additional needs.  Questions and concerns addressed.  PMT will continue to follow and support holistically.   Physical Exam Vitals and nursing note reviewed.  Constitutional:      General: She is sleeping. She is not in acute distress.    Appearance: She is ill-appearing.  Cardiovascular:     Rate and Rhythm: Normal rate.   Pulmonary:     Effort: No respiratory distress.  Skin:    General: Skin is warm and dry.  Neurological:     Motor: Weakness present.             Vital Signs: BP (!) 161/80 (BP Location: Right Arm)   Pulse 87   Temp 98.5 F (36.9 C) (Oral)   Resp 15   Ht 5\' 3"  (1.6 m)   Wt 45.5 kg   SpO2 100%   BMI 17.77 kg/m  SpO2: SpO2: 100 % O2 Device: O2 Device: Room Air O2 Flow Rate: O2 Flow Rate (L/min): 1 L/min       Palliative Assessment/Data: PPS 30%     Palliative Care Assessment & Plan   Patient Profile: 77 y.o. 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  She also had a seizure en route to the hospital and was treated accordingly. PMT has been consulted to assist with goals of care conversation.  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   Richardson Dopp, PA-C Palliative Medicine Team Team phone # [phone]  Thank you for allowing the Palliative Medicine Team to assist in the care of this patient. Please utilize  secure chat with additional questions, if there is no response within 30 minutes please call the above phone number.  Palliative Medicine Team providers are available by phone from 7am to 7pm daily and can be reached through the team cell phone.  Should this patient require assistance outside of these hours, please call the patient's attending physician.  Portions of this note are a verbal dictation therefore any spelling and/or grammatical errors are due to the "Dragon Medical One" system interpretation.

## 2023-06-30 NOTE — Progress Notes (Signed)
 Nutrition Follow-up  DOCUMENTATION CODES:   Severe malnutrition in context of social or environmental circumstances  INTERVENTION:  Continue dysphagia 1 diet as recommended per SLP Boost Breeze po BID, each supplement provides 250 kcal and 9 grams of protein [employer] 1.4 PO at bedtime, each supplement provides 455 kcal and 20 grams protein.   NUTRITION DIAGNOSIS:   Severe Malnutrition related to social / environmental circumstances as evidenced by severe fat depletion, severe muscle depletion. - remains applicable  GOAL:   Patient will meet greater than or equal to 90% of their needs - addressing via meals and nutrition supplements  MONITOR:   PO intake, Supplement acceptance  REASON FOR ASSESSMENT:   Consult Assessment of nutrition requirement/status  ASSESSMENT:   Pt admitted for stroke. 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  Received D50 this morning for reported low CBG of 17, 18 however uncertain of accuracy of reading as BMP glucose was 133 about 4 hours prior.   PMT continues to follow , continue with current interventions with goal to return home.   3/28 ileus noted on xray, abdomen non-distended and non-tender. Having bowel movements.   Called and spoke with pt's daughter via phone call to room. She reports that pt has been eating well. Lunch she ate all of her protein, half of her mac and cheese prior to realizing there was dairy in this, and then some of her broccoli.   Today she consumed all of her first scheduled boost breeze and half [employer] and is now napping. Spoke with RN via secure chat and pt's daughter about spacing out nutrition supplements so she can receive one in the evening and not so close together. Pt's daughter mentions that she has purchased Molli Posey for her to consume at home as well following discharge.   Meal  completions: 3/28: 50% breakfast, 10% lunch 3/29: 20% breakfast, 100% lunch, 80% dinner 3/30: 100% breakfast, 100% dinner 3/31: 85% breakfast  Last documented weight was 45.5 kg on 3/22. Will request weekly weights which remains admitted.   Medications: folvite, MVI, octreotide, miralax BID, senna BID, Mg sulfate once  Labs:  Mg 1.5 CBG's 17-190 x24 hours  Diet Order:   Diet Order             DIET - DYS 1 Fluid consistency: Thin  Diet effective now                   EDUCATION NEEDS:   Education needs have been addressed  Skin:  Skin Assessment: Reviewed RN Assessment  Last BM:  3/30  Height:   Ht Readings from Last 1 Encounters:  06/14/23 5\' 3"  (1.6 m)    Weight:   Wt Readings from Last 1 Encounters:  06/21/23 45.5 kg   BMI:  Body mass index is 17.77 kg/m.  Estimated Nutritional Needs:   Kcal:  1500-1700  Protein:  65-80h  Fluid:  >/=1.5 L  Drusilla Kanner, RDN, LDN Clinical Nutrition See AMiON for contact information.

## 2023-06-30 NOTE — Plan of Care (Signed)

## 2023-06-30 NOTE — TOC Progression Note (Signed)
 Transition of Care (TOC) - Progression Note  Donn Pierini RN, BSN Transitions of Care Unit 4E- RN Case Manager See Treatment Team for direct phone #   Patient Details  Name: Olivia Ross MRN: [id number] Date of Birth: Mar 17, 1946  Transition of Care [facility]) CM/SW Contact  Zenda Alpers Lenn Sink, RN Phone Number: 06/30/2023, 1:56 PM  Clinical Narrative:    Follow up done with daughter [name] at the bedside.  Discussed CM did speak with NP- Boyd Kerbs and that there are no HH services connected w/ [facility] at this time- per [facility] is pt/family choice and she will work with any HH that is set up for pt. Confirmed HH services choice is [facility] and if they are unable to daughter does not have further preference.   Updated [name] regarding DME- bed, she has spoken with [facility] and aware that they are waiting for pt to be within 48hr of discharge per insurance guidelines for bed delivery. CM has contacted [employer] to f/u today with regards to scheduling delivery.   Also confirmed that referral was made to [facility] for outpt PC needs and they will contact daughter post discharge.   [name] voiced she has no further questions or needs at this time. She has this writers contact info if needed.   Call made to [facility] for [facility] referral - referral has been accepted for HHPT/OT.   TOC to continue to follow.    Expected Discharge Plan: Home w Home Health Services Barriers to Discharge: Continued Medical Work up  Expected Discharge Plan and Services   Discharge Planning Services: CM Consult Post Acute Care Choice: Durable Medical Equipment, Home Health Living arrangements for the past 2 months: Single Family Home                 DME Arranged: Hospital bed DME Agency: [employer] Date DME Agency Contacted: 06/20/23 Time DME Agency Contacted: [id number] Representative spoke with at DME Agency: Ian Malkin HH Arranged: PT, OT HH Agency: [facility] Date [facility] Agency Contacted:  06/30/23 Time HH Agency Contacted: [id number] Representative spoke with at [facility] Agency: Kandee Keen   Social Determinants of Health (SDOH) Interventions SDOH Screenings   Food Insecurity: Patient Unable To Answer (06/14/2023)  Housing: Patient Unable To Answer (06/14/2023)  Transportation Needs: Patient Unable To Answer (06/14/2023)  Recent Concern: Transportation Needs - Unmet Transportation Needs (04/25/2023)   Received from [facility]  Utilities: Patient Unable To Answer (06/14/2023)  Financial Resource Strain: Low Risk  (04/25/2023)   Received from [facility]  Physical Activity: Unknown (04/25/2023)   Received from [facility]  Social Connections: Patient Unable To Answer (06/14/2023)  Stress: No Stress Concern Present (04/25/2023)   Received from [facility]  Tobacco Use: Medium Risk (06/14/2023)    Readmission Risk Interventions     No data to display

## 2023-07-01 ENCOUNTER — Other Ambulatory Visit (HOSPITAL_COMMUNITY): Payer: Self-pay

## 2023-07-01 ENCOUNTER — Telehealth (HOSPITAL_COMMUNITY): Payer: Self-pay | Admitting: Pharmacy Technician

## 2023-07-01 DIAGNOSIS — Z789 Other specified health status: Secondary | ICD-10-CM | POA: Diagnosis not present

## 2023-07-01 DIAGNOSIS — Z515 Encounter for palliative care: Secondary | ICD-10-CM | POA: Diagnosis not present

## 2023-07-01 DIAGNOSIS — G40409 Other generalized epilepsy and epileptic syndromes, not intractable, without status epilepticus: Secondary | ICD-10-CM | POA: Diagnosis not present

## 2023-07-01 DIAGNOSIS — E876 Hypokalemia: Secondary | ICD-10-CM | POA: Diagnosis not present

## 2023-07-01 LAB — BASIC METABOLIC PANEL WITH GFR
Anion gap: 11 (ref 5–15)
BUN: 23 mg/dL (ref 8–23)
CO2: 25 mmol/L (ref 22–32)
Calcium: 9.1 mg/dL (ref 8.9–10.3)
Chloride: 104 mmol/L (ref 98–111)
Creatinine, Ser: 0.65 mg/dL (ref 0.44–1.00)
GFR, Estimated: >60 mL/min (ref >60)
Glucose, Bld: 109 mg/dL — ABNORMAL HIGH (ref 70–99)
Potassium: 4 mmol/L (ref 3.5–5.1)
Sodium: 140 mmol/L (ref 135–145)

## 2023-07-01 LAB — MAGNESIUM: Magnesium: 2.1 mg/dL (ref 1.7–2.4)

## 2023-07-01 LAB — GLUCOSE, RANDOM
Glucose, Bld: 118 mg/dL — ABNORMAL HIGH (ref 70–99)
Glucose, Bld: 114 mg/dL — ABNORMAL HIGH (ref 70–99)
Glucose, Bld: 111 mg/dL — ABNORMAL HIGH (ref 70–99)
Glucose, Bld: 104 mg/dL — ABNORMAL HIGH (ref 70–99)
Glucose, Bld: 111 mg/dL — ABNORMAL HIGH (ref 70–99)

## 2023-07-01 LAB — AMMONIA: Ammonia: 106 umol/L — ABNORMAL HIGH (ref 9–35)

## 2023-07-01 MED ORDER — LACOSAMIDE 10 MG/ML PO SOLN
100.0000 mg | Freq: Once | ORAL | Status: AC
  Administered 2023-07-01: 100 mg via ORAL
  Filled 2023-07-01 (×2): qty 10

## 2023-07-01 MED ORDER — LACTULOSE 10 GM/15ML PO SOLN
10.0000 g | Freq: Every day | ORAL | Status: DC
  Administered 2023-07-01 – 2023-07-03 (×3): 10 g via ORAL
  Filled 2023-07-01 (×3): qty 15

## 2023-07-01 MED ORDER — PANTOPRAZOLE SODIUM 40 MG IV SOLR
40.0000 mg | INTRAVENOUS | Status: DC
  Administered 2023-07-01 – 2023-07-03 (×3): 40 mg via INTRAVENOUS
  Filled 2023-07-01 (×3): qty 10

## 2023-07-01 MED ORDER — OCTREOTIDE ACETATE 100 MCG/ML ~~LOC~~ SOSY
100.0000 ug | PREFILLED_SYRINGE | Freq: Three times a day (TID) | SUBCUTANEOUS | 2 refills | Status: AC
  Filled 2023-07-01: qty 90, 30d supply, fill #0

## 2023-07-01 MED ORDER — LACOSAMIDE 10 MG/ML PO SOLN
50.0000 mg | Freq: Two times a day (BID) | ORAL | Status: DC
  Administered 2023-07-02 – 2023-07-03 (×4): 50 mg via ORAL
  Filled 2023-07-01 (×4): qty 5

## 2023-07-01 NOTE — Plan of Care (Signed)
 Ammonia rising to 106 - Stop Depakote and L-carnitine - Vimpat 100 mg once tonight, then 50 mg BID starting tomorrow

## 2023-07-01 NOTE — Telephone Encounter (Signed)
 Patient Product/process development scientist completed.    The patient is insured through [employer]. Patient has Medicare and is not eligible for a copay card, but may be able to apply for patient assistance or Medicare RX Payment Plan (Patient Must reach out to their plan, if eligible for payment plan), if available.    Ran test claim for octreotide 100 mcg/ml Soln and Requires Prior Authorization   This test claim was processed through [employer]- copay amounts may vary at other pharmacies due to [employer], or as the patient moves through the different stages of their insurance plan.     Roland Earl, CPHT Pharmacy Technician III Certified Patient Advocate [facility] Patient Advocate Team Direct Number: [phone]  Fax: [phone]

## 2023-07-01 NOTE — Progress Notes (Signed)
 PT Cancellation/Discharge Note  Patient Details Name: Olivia Ross MRN: [id number] DOB: 06/08/1945   Cancelled Treatment:    Reason Eval/Treat Not Completed: Patient's level of consciousness  Unable to arouse pt for session. RN and notes indicate this has been pt's norm recently. She has made little to no progress towards her acute goals which expire today, 4/1.    Patient is being discharged from PT services secondary to:   Medical decline- will need to re-order to resume therapy services.  Patient has made no progress toward goals in a reasonable time frame.    Please see latest Therapy Progress Note for current level of functioning and progress toward goals.  Progress and discharge plan and discussed with patient/caregiver and they   Patient unable to participate in discharge planning     Jerolyn Center, PT Acute Rehabilitation Services  Office [phone]  Zena Amos 07/01/2023, 3:22 PM

## 2023-07-01 NOTE — Progress Notes (Signed)
 SLP Cancellation Note  Patient Details Name: Olivia Ross MRN: [id number] DOB: 21-Aug-1945   Cancelled treatment:       Reason Eval/Treat Not Completed: Other (comment), Attempted calls to the pts daughter x2 today but no response. Just wanted to check if she had any further questions. Per dietitian note, the pt has been eating well and daughter is planning meals appropriately at home. Will sign off at this time.    Lawrance Wiedemann, Riley Nearing 07/01/2023, 1:33 PM

## 2023-07-01 NOTE — Telephone Encounter (Signed)
 Pharmacy Patient Advocate Encounter  Received notification from [facility] that Prior Authorization for Octreotide Acetate 100MCG/ML solution has been APPROVED from 07/01/2023 to 03/31/2024. Ran test claim, Copay is $10.00. This test claim was processed through [facility]- copay amounts may vary at other pharmacies due to pharmacy/plan contracts, or as the patient moves through the different stages of their insurance plan.   PA #/Case ID/Reference #: [id number] Key: [id number]

## 2023-07-01 NOTE — Progress Notes (Addendum)
 PROGRESS NOTE    Olivia Ross  ZOX:[id number] DOB: [birth year] DOA: 06/14/2023 PCP: Stevphen Rochester, MD   Brief Narrative: Olivia Ross is a 78 y.o. female with a history of vascular dementia, multiple strokes, hypertension, hyperlipidemia.  Patient presented secondary to code stroke with left gaze deviation aphasia and possible seizures.  Neurology was consulted on admission and started on Keppra.  Patient required intubation and ICU admission on admission with extubation same day.  MRI brain negative for acute stroke.  Hospitalization complicated by ongoing poor oral intake with ongoing goals of care discussions.   Assessment and Plan:  Seizure Neurology consulted.  LTM EEG was negative for ongoing seizures and MRI was negative for acute processes to explain seizures.  Patient was initially managed on Keppra but was transitioned to valproic acid secondary to concern of lethargy. Ammonia mildly elevated. -Continue valproic acid and levocarnitine -Continue seizure precautions -Re-consult neurology (4/1) as patient's daughter is concerned about excessive sedation -Recheck ammonia  Pancreatic mass CT imaging with mass measuring 2.5 cm in the right upper quadrant of the pancreas with follow-up MRI of the abdomen is significant for 2.2 x 2.1 cm lesion of the pancreatic head with raise concern for neoplasm.  Findings discussed with family with recommendations and decision to not pursue ongoing workup or to pursue management secondary to patient's underlying comorbidities.  Palliative care consulted for ongoing goals of care.  Poor oral intake Unclear if this is from acute illness versus related to progressive worsening in setting of multiple chronic illnesses and underlying dementia.  Family is concerned that medication effect could be worsening mental status.  Medications adjusted to minimize effect on mental status.  Palliative care medicine also consulted for help with goals of care  discussions.  At this time, focus is for quality of life, however ongoing goals of care discussions are still being pursued.  Left arm swelling Likely related to malfunctioning IV. Improved with removal of IV. No upper extremity DVT noted.  Acute respiratory failure with hypoxia Patient required intubation on admission but was quickly extubated same day.  Currently on room air.  Respiratory failure resolved.  Hypoglycemia Likely related to poor oral intake in addition to underlying poor nutrition with notable cachectic body habitus with associated underweight BMI.  C-peptide elevated with concern hypoglycemia could be insulin mediated. Patient managed with D50 in addition to D5 IV fluids. Incongruence between CBGs from left and right hand, possibly related to significant left upper extremity edema. -Continue octreotide -Check blood glucose q4 hours via blood draw per daughter request  Ileus Noted on x-ray. No nausea/vomiting. Abdomen is non-distended and non-tender today. Patient having bowel movements.  Hypokalemia Resolved with repletion  Hypomagnesemia Magnesium of 1.5 today -Magnesium supplementation -Repeat magnesium in a.m.  Vascular dementia Noted.  Complicating current prognosis.  Anemia of chronic disease Hemoglobin of 12.2 on admission with immediate downtrend to of 9.8.  Hemoglobin has been stable around 9-10 with acute drop to 7.7. Hemoglobin stabile.  No obvious evidence of bleeding at this time.  Thrombocytopenia Unclear etiology.  Possibly related to acute illness. Resolved.  Oropharyngeal dysphagia Patient evaluated by speech therapy. -Speech therapy recommendations (3/21): Diet recommendations: Dysphagia 1 (puree);Thin liquid Liquids provided via: Cup;Straw Medication Administration: Crushed with puree Supervision: Full supervision/cueing for compensatory strategies Compensations: Slow rate;Small sips/bites Postural Changes and/or Swallow Maneuvers: Seated  upright 90 degrees  Elevated AST/ALT Mild.  MRI of the abdomen without evidence of hepatic injury/disease.  No obvious biliary obstruction.  Normal bilirubin.  Severe malnutrition -Dietitian recommendations (3/24): Continue Ensure Enlive po BID, each supplement provides 350 kcal and 20 grams of protein. Encourage caloric beverages such as juice.  Order [employer] with dinner.  MVI with minerals and folic acid.   Goals of care Palliative care consulted for assistance in goals of care discussions.  Patient is currently full code.  Long discussion with daughter via telephone.  Per daughter, focus is comfort and quality of life.  She wants to focus on patient eating better to allow for better control of her blood sugar.  She believes that the antiepileptic drug treatment is contributing to patient's lethargy and decreased ability to take p.o.  When discussing the pancreatic mass with concern for cancer, daughter states that she agrees that workup and management would likely contribute negatively towards quality of life.  We also discussed CODE STATUS in addition to how much to do and how much not to do from his [place] as relates to her medical care, for which patient's daughter stated that she still needed time to think about this.  Underweight Estimated body mass index is 17.77 kg/m as calculated from the following:   Height as of this encounter: 5\' 3"  (1.6 m).   Weight as of this encounter: 45.5 kg.   DVT prophylaxis: Subcutaneous heparin Code Status:   Code Status: Full Code Family Communication: None at bedside. Daughter on telephone Disposition Plan: Discharge tomorrow pending neurology recommendations and home health equipment   Consultants:  Neurology Palliative care medicine  Procedures:  LTM EEG Intubation/extubation  Antimicrobials: Vancomycin Cefepime Flagyl   Subjective: No issues noted from overnight. No hypoglycemia.  Objective: BP (!) 172/90 (BP Location: Right  Arm)   Pulse 76   Temp 97.9 F (36.6 C) (Axillary)   Resp 18   Ht 5\' 3"  (1.6 m)   Wt 45.5 kg   SpO2 100%   BMI 17.77 kg/m   Examination:  General exam: Appears calm and comfortable. Cachectic and chronically ill appearing. Respiratory system: Clear to auscultation. Respiratory effort normal. Cardiovascular system: S1 & S2 heard, RRR Gastrointestinal system: Abdomen is nondistended, soft and nontender. Normal bowel sounds heard. Musculoskeletal: Right hand edema. No calf tenderness   Data Reviewed: I have personally reviewed following labs and imaging studies  CBC Lab Results  Component Value Date   WBC 5.7 06/27/2023   RBC 2.43 (L) 06/27/2023   HGB 8.0 (L) 06/27/2023   HCT 24.9 (L) 06/27/2023   MCV 102.5 (H) 06/27/2023   MCH 32.9 06/27/2023   PLT 201 06/27/2023   MCHC 32.1 06/27/2023   RDW 15.7 (H) 06/27/2023   LYMPHSABS 1.0 06/25/2023   MONOABS 0.4 06/25/2023   EOSABS 0.1 06/25/2023   BASOSABS 0.0 06/25/2023     Last metabolic panel Lab Results  Component Value Date   NA 140 07/01/2023   K 4.0 07/01/2023   CL 104 07/01/2023   CO2 25 07/01/2023   BUN 23 07/01/2023   CREATININE 0.65 07/01/2023   GLUCOSE 111 (H) 07/01/2023   GFRNONAA >60 07/01/2023   CALCIUM 9.1 07/01/2023   PHOS 1.3 (L) 06/18/2023   PROT 4.6 (L) 06/27/2023   ALBUMIN 2.2 (L) 06/27/2023   BILITOT 0.6 06/27/2023   ALKPHOS 86 06/27/2023   AST 16 06/27/2023   ALT 23 06/27/2023   ANIONGAP 11 07/01/2023    GFR: Estimated Creatinine Clearance: 41 mL/min (by C-G formula based on SCr of 0.65 mg/dL).  No results found for this or any previous visit (from the past 240  hours).    Radiology Studies: No results found.     LOS: 17 days    Jacquelin Hawking, MD [facility] 07/01/2023, 1:30 PM   If 7PM-7AM, please contact night-coverage [email or web address]

## 2023-07-02 ENCOUNTER — Other Ambulatory Visit (HOSPITAL_COMMUNITY): Payer: Self-pay

## 2023-07-02 DIAGNOSIS — E7220 Disorder of urea cycle metabolism, unspecified: Secondary | ICD-10-CM

## 2023-07-02 DIAGNOSIS — G9341 Metabolic encephalopathy: Secondary | ICD-10-CM | POA: Diagnosis not present

## 2023-07-02 DIAGNOSIS — G40409 Other generalized epilepsy and epileptic syndromes, not intractable, without status epilepticus: Secondary | ICD-10-CM | POA: Diagnosis not present

## 2023-07-02 LAB — GLUCOSE, RANDOM
Glucose, Bld: 114 mg/dL — ABNORMAL HIGH (ref 70–99)
Glucose, Bld: 90 mg/dL (ref 70–99)
Glucose, Bld: 91 mg/dL (ref 70–99)
Glucose, Bld: 132 mg/dL — ABNORMAL HIGH (ref 70–99)
Glucose, Bld: 97 mg/dL (ref 70–99)
Glucose, Bld: 119 mg/dL — ABNORMAL HIGH (ref 70–99)

## 2023-07-02 LAB — MAGNESIUM: Magnesium: 1.9 mg/dL (ref 1.7–2.4)

## 2023-07-02 NOTE — Progress Notes (Signed)
 PROGRESS NOTE   Olivia Ross  ZOX:[id number]    DOB: 1945-08-31    DOA: 06/14/2023  PCP: Stevphen Rochester, MD   I have briefly reviewed patients previous medical records in [facility].  Chief Complaint  Patient presents with   Code Stroke    Brief Hospital Course:   78 y.o. female with a history of vascular dementia, multiple strokes, hypertension, hyperlipidemia presented secondary to strokelike symptoms/code stroke with left gaze deviation aphasia and possible seizures. Neurology was consulted on admission and started on Keppra. Patient required intubation and ICU admission with extubation same day. MRI brain negative for acute stroke. Hospitalization complicated by ongoing poor oral intake with ongoing goals of care discussions.  Patient was on Depakote but due to hyperammonemia, neurology discontinued Depakote and initiated Vimpat on 4/1.  Ongoing some degree of metabolic encephalopathy.   Assessment & Plan:  Principal Problem:   Seizure disorder, grand mal [facility])  Seizure Neurology consulted.  LTM EEG was negative for ongoing seizures and MRI was negative for acute processes to explain seizures.  Patient was initially managed on Keppra but was transitioned to valproic acid secondary to concern of lethargy.  - due to hyperammonemia/106, Neurology discontinued Depakote and initiated Vimpat on 4/1.  Ongoing some degree of metabolic encephalopathy/sedation. -Continue seizure precautions   Pancreatic mass CT imaging with mass measuring 2.5 cm in the right upper quadrant of the pancreas with follow-up MRI of the abdomen is significant for 2.2 x 2.1 cm lesion of the pancreatic head with raise concern for neoplasm.  Findings discussed with by prior [facility] DM with family with recommendations and decision to not pursue ongoing workup or to pursue management secondary to patient's underlying comorbidities.  Palliative care consulted for ongoing goals of care.   Poor oral intake Unclear  if this is from acute illness versus related to progressive worsening in setting of multiple chronic illnesses and underlying dementia.  Family is concerned that medication effect could be worsening mental status and there may be some truth to this.  Hyperammonemia related metabolic encephalopathy.  Medications adjusted to minimize effect on mental status.  Palliative care medicine also consulted for help with goals of care discussions.  At this time, focus is for quality of life, however ongoing goals of care discussions are still being pursued.   Left arm swelling Likely related to malfunctioning IV. Improved with removal of IV. No upper extremity DVT noted.   Acute respiratory failure with hypoxia Patient required intubation on admission but was quickly extubated same day.  Currently on room air.  Respiratory failure resolved.   Hypoglycemia Likely related to poor oral intake in addition to underlying poor nutrition with notable cachectic body habitus with associated underweight BMI.  C-peptide elevated with concern hypoglycemia could be insulin mediated. Patient managed with D50 in addition to D5 IV fluids. Incongruence between CBGs from left and right hand, possibly related to significant left upper extremity edema. -Continue octreotide -Check blood glucose q4 hours via blood draw per daughter request.  No hypoglycemic episodes in the last 24 hours.   Ileus Noted on x-ray. No nausea/vomiting. Abdomen is non-distended and non-tender today. Patient having bowel movements.   Hypokalemia Resolved with repletion   Hypomagnesemia Magnesium 2.1 on 4/1   Vascular dementia Noted.  Complicating current prognosis.   Anemia of chronic disease Hemoglobin stable in the 7.5-8 g range and of March.   Thrombocytopenia Unclear etiology.  Possibly related to acute illness. Resolved.   Oropharyngeal dysphagia Patient evaluated  by speech therapy. -Speech therapy recommendations (3/21): Diet  recommendations: Dysphagia 1 (puree);Thin liquid Liquids provided via: Cup;Straw Medication Administration: Crushed with puree Supervision: Full supervision/cueing for compensatory strategies Compensations: Slow rate;Small sips/bites Postural Changes and/or Swallow Maneuvers: Seated upright 90 degrees   Elevated AST/ALT Mild.  MRI of the abdomen without evidence of hepatic injury/disease.  No obvious biliary obstruction.  Normal bilirubin.   Severe malnutrition -Dietitian recommendations (3/24): Continue Ensure Enlive po BID, each supplement provides 350 kcal and 20 grams of protein. Encourage caloric beverages such as juice.  Order [employer] with dinner.  MVI with minerals and folic acid.    Goals of care Palliative care consulted for assistance in goals of care discussions.  Patient is currently full code.  Prior [facility] MD had long discussion with daughter via telephone.  Per daughter, focus is comfort and quality of life.  She wants to focus on patient eating better to allow for better control of her blood sugar.  She believes that the antiepileptic drug treatment is contributing to patient's lethargy and decreased ability to take p.o.  When discussing the pancreatic mass with concern for cancer, daughter states that she agrees that workup and management would likely contribute negatively towards quality of life.  They also discussed CODE STATUS in addition to how much to do and how much not to do from his [place] as relates to her medical care, for which patient's daughter stated that she still needed time to think about this.  Body mass index is 17.77 kg/m.  Nutritional Status Nutrition Problem: Severe Malnutrition Etiology: social / environmental circumstances Signs/Symptoms: severe fat depletion, severe muscle depletion Interventions: Ensure Enlive (each supplement provides 350kcal and 20 grams of protein), MVI    DVT prophylaxis: heparin injection 5,000 Units Start: 06/14/23  0645 SCDs Start: 06/14/23 4098     Code Status: Full Code:  Family Communication: None at bedside Disposition:  Status is: Inpatient Remains inpatient appropriate because: Remains drowsy with intermittent awakeness, poor oral intake and pocketing food in the context of hyperammonemia.  CBGs being monitored closely for hypoglycemia.     Consultants:   Neurology Palliative care medicine  Procedures:     Antimicrobials:      Subjective:  Patient is nonverbal.  As per patient's RN, she had taken care of of the patient last week when patient was more verbal.  Currently drifts in and out of drowsiness, pockets food but no other acute issues noted.  Objective:   Vitals:   07/02/23 0317 07/02/23 0757 07/02/23 1100 07/02/23 1106  BP: (!) 172/68 (!) 157/70 (!) 150/100 (!) 163/92  Pulse: 88  66   Resp: 18     Temp: (!) 97 F (36.1 C) 97.8 F (36.6 C) 97.6 F (36.4 C)   TempSrc: Axillary Axillary Axillary   SpO2: 100%     Weight:      Height:        General exam: Elderly female, small built, frail, chronically ill looking and cachectic, lying crouched in bed, possibly has upper extremity and even lower extremity contractures.  Noted food on ground.  Nurse today getting ready to bathe her. Respiratory system: Clear to auscultation. Respiratory effort normal. Cardiovascular system: S1 & S2 heard, RRR. No JVD, murmurs, rubs, gallops or clicks. No pedal edema.  Telemetry personally reviewed: Sinus rhythm with some intermittent bigeminy.?  Atrial flutter/A-fib with mild RVR 2 days ago. Gastrointestinal system: Abdomen is nondistended, soft and nontender. No organomegaly or masses felt. Normal bowel sounds heard.  Central nervous system: Mental status as noted above.  Does not track activity around her consistently.  Nonverbal.  Does not follow any instructions.  No focal neurological deficits. Extremities: Unable to assess power.  May have contractures of all extremities. Skin: No  rashes, lesions or ulcers Psychiatry: Judgement and insight cannot be assessed. Mood & affect cannot be assessed.     Data Reviewed:   I have personally reviewed following labs and imaging studies   CBC: Recent Labs  Lab 06/26/23 0346 06/27/23 0330  WBC 5.1 5.7  HGB 7.5* 8.0*  HCT 22.6* 24.9*  MCV 101.8* 102.5*  PLT 160 201    Basic Metabolic Panel: Recent Labs  Lab 06/26/23 0346 06/27/23 0330 06/28/23 0420 06/29/23 1813 06/30/23 0848 06/30/23 1203 07/01/23 0514 07/01/23 0802 07/01/23 2025 07/02/23 0026 07/02/23 0425 07/02/23 0846 07/02/23 1218  NA 139 138  --   --   --   --  140  --   --   --   --   --   --   K 4.1 4.5  --   --   --   --  4.0  --   --   --   --   --   --   CL 106 106  --   --   --   --  104  --   --   --   --   --   --   CO2 27 26  --   --   --   --  25  --   --   --   --   --   --   GLUCOSE 115* 103*  --    < > 83   < > 109*   < > 111* 114* 90 91 132*  BUN 14 17  --   --   --   --  23  --   --   --   --   --   --   CREATININE 0.50 0.51  --   --   --   --  0.65  --   --   --   --   --   --   CALCIUM 8.7* 8.7*  --   --   --   --  9.1  --   --   --   --   --   --   MG 2.4 1.9 1.7  --  1.5*  --  2.1  --   --   --  1.9  --   --    < > = values in this interval not displayed.    Liver Function Tests: Recent Labs  Lab 06/27/23 0330  AST 16  ALT 23  ALKPHOS 86  BILITOT 0.6  PROT 4.6*  ALBUMIN 2.2*    CBG: Recent Labs  Lab 06/30/23 0522 06/30/23 0524 06/30/23 0752  GLUCAP 17* 190* 61*    Microbiology Studies:  No results found for this or any previous visit (from the past 240 hours).  Radiology Studies:  No results found.  Scheduled Meds:    amLODipine  5 mg Oral Daily   Chlorhexidine Gluconate Cloth  6 each Topical Daily   feeding supplement  1 Container Oral BID BM   feeding supplement (KATE FARMS STANDARD 1.4)  325 mL Oral Daily   folic acid  1 mg Oral Daily   heparin  5,000 Units Subcutaneous Q8H   irbesartan  75 mg  Oral Daily   lacosamide  50 mg Oral BID   lactulose  10 g Oral Daily   multivitamin with minerals  1 tablet Oral Daily   octreotide  100 mcg Subcutaneous Q8H   mouth rinse  15 mL Mouth Rinse 4 times per day   pantoprazole (PROTONIX) IV  40 mg Intravenous Q24H   polyethylene glycol  17 g Oral BID   senna-docusate  2 tablet Oral BID    Continuous Infusions:     LOS: 18 days     Marcellus Scott, MD,  FACP, [facility], [facility], [facility]   [facility] [place]      To contact the attending provider between 7A-7P or the covering provider during after hours 7P-7A, please log into the web site [email or web address] and access using universal [place] password for that web site. If you do not have the password, please call the hospital operator.  07/02/2023, 3:06 PM

## 2023-07-02 NOTE — Plan of Care (Signed)

## 2023-07-02 NOTE — Care Management Important Message (Signed)
 Important Message  Patient Details  Name: Olivia Ross MRN: [id number] Date of Birth: 1945-07-07   Important Message Given:  Yes - Medicare IM     Renie Ora 07/02/2023, 10:26 AM

## 2023-07-02 NOTE — Progress Notes (Signed)
 Occupational Therapy Treatment Patient Details Name: Olivia Ross MRN: [id number] DOB: 1945/11/07 Today's Date: 07/02/2023   History of present illness 78 y.o. female presents to ED 06/14/23 for Code Stroke, left gaze and aphasic, questionable seizure. EEG showing potential moderate diffuse encephalopathy with no apparent seizure activity.  Intubated <24 hours  PMH: dementia, HTN, right breast cancer, R MCA CVA, L MCA CVA   OT comments  Pt awakened for therapy. Repositioned in bed with total assist. Completed hand care and PROM B UEs as tolerated and reapplied R palm guard. Hand over hand assist to use L hand to drink from cup with straw. Session limited by arrival of phlebotomy, NT made aware pt will need help to eat breakfast. Goals updated.      If plan is discharge home, recommend the following:  Two people to help with walking and/or transfers;A lot of help with bathing/dressing/bathroom;Assistance with cooking/housework;Assistance with feeding;Assist for transportation;Help with stairs or ramp for entrance;Direct supervision/assist for financial management;Direct supervision/assist for medications management   Equipment Recommendations  Hospital bed;Hoyer lift    Recommendations for Other Services      Precautions / Restrictions Precautions Precautions: Fall Restrictions Weight Bearing Restrictions Per Provider Order: No       Mobility Bed Mobility Overal bed mobility: Needs Assistance             General bed mobility comments: total assist to reposition for eating    Transfers                         Balance                                           ADL either performed or assessed with clinical judgement   ADL Overall ADL's : Needs assistance/impaired Eating/Feeding: Total assistance;Bed level Eating/Feeding Details (indicate cue type and reason): hand over hand assist for cup with straw to mouth Grooming: Wash/dry hands;Total  assistance;Wash/dry face                                      Extremity/Trunk Assessment Upper Extremity Assessment RUE Deficits / Details: performed skin care, cleaned off palm protector, completed PROM within tolerance RUE Coordination: decreased fine motor;decreased gross motor LUE Deficits / Details: did not observe any functional use, washed and dried hand and completed PROM within tolerance            Vision       Perception     Praxis     Communication Communication Communication: Impaired   Cognition Arousal: Alert Behavior During Therapy: Flat affect Cognition: History of cognitive impairments             OT - Cognition Comments: moaning and grimacing is only communication                 Following commands: Impaired        Cueing      Exercises      Shoulder Instructions       General Comments      Pertinent Vitals/ Pain       Pain Assessment Pain Assessment: Faces Faces Pain Scale: Hurts little more Pain Location: hands with washing and ROM Pain Descriptors / Indicators: Moaning Pain Intervention(s): Monitored during session, Repositioned  Home Living                                          Prior Functioning/Environment              Frequency  Min 1X/week        Progress Toward Goals  OT Goals(current goals can now be found in the care plan section)  Progress towards OT goals: Not progressing toward goals - comment  Acute Rehab OT Goals OT Goal Formulation: Patient unable to participate in goal setting Time For Goal Achievement: 07/16/23 Potential to Achieve Goals: Fair ADL Goals Pt Will Perform Eating: with min assist;bed level Pt/caregiver will Perform Home Exercise Program: Increased ROM;With written HEP provided;Both right and left upper extremity (and skin care of hands)  Plan      Co-evaluation                 AM-PAC OT "6 Clicks" Daily Activity     Outcome  Measure   Help from another person eating meals?: Total Help from another person taking care of personal grooming?: Total Help from another person toileting, which includes using toliet, bedpan, or urinal?: Total Help from another person bathing (including washing, rinsing, drying)?: Total Help from another person to put on and taking off regular upper body clothing?: Total Help from another person to put on and taking off regular lower body clothing?: Total 6 Click Score: 6    End of Session    OT Visit Diagnosis: Muscle weakness (generalized) (M62.81);Other symptoms and signs involving cognitive function;Pain Hemiplegia - Right/Left: Right Hemiplegia - dominant/non-dominant: Dominant Hemiplegia - caused by: Cerebral infarction   Activity Tolerance Patient tolerated treatment well   Patient Left in bed;with call bell/phone within reach;with bed alarm set   Nurse Communication Other (comment) (NT aware pt is a feeder)        Time: 2440-1027 OT Time Calculation (min): 29 min  Charges: OT General Charges $OT Visit: 1 Visit OT Treatments $Self Care/Home Management : 8-22 mins $Therapeutic Exercise: 8-22 mins  Berna Spare, OTR/L Acute Rehabilitation Services Office: [phone]   Evern Bio 07/02/2023, 9:45 AM

## 2023-07-03 ENCOUNTER — Other Ambulatory Visit (HOSPITAL_COMMUNITY): Payer: Self-pay

## 2023-07-03 DIAGNOSIS — G40409 Other generalized epilepsy and epileptic syndromes, not intractable, without status epilepticus: Secondary | ICD-10-CM | POA: Diagnosis not present

## 2023-07-03 DIAGNOSIS — F0390 Unspecified dementia without behavioral disturbance: Secondary | ICD-10-CM | POA: Diagnosis not present

## 2023-07-03 LAB — COMPREHENSIVE METABOLIC PANEL WITH GFR
ALT: 21 U/L (ref 0–44)
AST: 22 U/L (ref 15–41)
Albumin: 2 g/dL — ABNORMAL LOW (ref 3.5–5.0)
Alkaline Phosphatase: 73 U/L (ref 38–126)
Anion gap: 10 (ref 5–15)
BUN: 29 mg/dL — ABNORMAL HIGH (ref 8–23)
CO2: 26 mmol/L (ref 22–32)
Calcium: 9 mg/dL (ref 8.9–10.3)
Chloride: 106 mmol/L (ref 98–111)
Creatinine, Ser: 0.72 mg/dL (ref 0.44–1.00)
GFR, Estimated: >60 mL/min (ref >60)
Glucose, Bld: 144 mg/dL — ABNORMAL HIGH (ref 70–99)
Potassium: 3.8 mmol/L (ref 3.5–5.1)
Sodium: 142 mmol/L (ref 135–145)
Total Bilirubin: 0.4 mg/dL (ref 0.0–1.2)
Total Protein: 4.6 g/dL — ABNORMAL LOW (ref 6.5–8.1)

## 2023-07-03 LAB — CBC
HCT: 26.2 % — ABNORMAL LOW (ref 36.0–46.0)
Hemoglobin: 8.5 g/dL — ABNORMAL LOW (ref 12.0–15.0)
MCH: 33.9 pg (ref 26.0–34.0)
MCHC: 32.4 g/dL (ref 30.0–36.0)
MCV: 104.4 fL — ABNORMAL HIGH (ref 80.0–100.0)
Platelets: 283 10*3/uL (ref 150–400)
RBC: 2.51 MIL/uL — ABNORMAL LOW (ref 3.87–5.11)
RDW: 16.9 % — ABNORMAL HIGH (ref 11.5–15.5)
WBC: 7.7 10*3/uL (ref 4.0–10.5)
nRBC: 0 % (ref 0.0–0.2)

## 2023-07-03 LAB — GLUCOSE, RANDOM
Glucose, Bld: 110 mg/dL — ABNORMAL HIGH (ref 70–99)
Glucose, Bld: 85 mg/dL (ref 70–99)

## 2023-07-03 LAB — MAGNESIUM: Magnesium: 1.6 mg/dL — ABNORMAL LOW (ref 1.7–2.4)

## 2023-07-03 LAB — AMMONIA: Ammonia: 57 umol/L — ABNORMAL HIGH (ref 9–35)

## 2023-07-03 MED ORDER — FOLIC ACID 1 MG PO TABS
1.0000 mg | ORAL_TABLET | Freq: Every day | ORAL | 0 refills | Status: AC
  Filled 2023-07-03: qty 90, 90d supply, fill #0

## 2023-07-03 MED ORDER — LACTULOSE 10 GM/15ML PO SOLN
10.0000 g | Freq: Every day | ORAL | 0 refills | Status: AC
  Filled 2023-07-03: qty 45, 3d supply, fill #0

## 2023-07-03 MED ORDER — SENNOSIDES-DOCUSATE SODIUM 8.6-50 MG PO TABS
2.0000 | ORAL_TABLET | Freq: Every evening | ORAL | 0 refills | Status: AC | PRN
  Filled 2023-07-03: qty 30, 15d supply, fill #0

## 2023-07-03 MED ORDER — MAGNESIUM SULFATE 2 GM/50ML IV SOLN
2.0000 g | Freq: Once | INTRAVENOUS | Status: AC
  Administered 2023-07-03: 2 g via INTRAVENOUS
  Filled 2023-07-03: qty 50

## 2023-07-03 MED ORDER — POLYETHYLENE GLYCOL 3350 17 GM/SCOOP PO POWD
17.0000 g | Freq: Every day | ORAL | 0 refills | Status: AC | PRN
  Filled 2023-07-03: qty 238, 14d supply, fill #0

## 2023-07-03 MED ORDER — LACOSAMIDE 50 MG PO TABS
50.0000 mg | ORAL_TABLET | Freq: Two times a day (BID) | ORAL | 2 refills | Status: AC
  Filled 2023-07-03: qty 60, 30d supply, fill #0

## 2023-07-03 NOTE — TOC Transition Note (Addendum)
 Transition of Care (TOC) - Discharge Note Donn Pierini RN, BSN Transitions of Care Unit 4E- RN Case Manager See Treatment Team for direct phone #   Patient Details  Name: Olivia Ross MRN: [id number] Date of Birth: [birth year]  Transition of Care [facility]) CM/SW Contact:  Darrold Span, RN Phone Number: 07/03/2023, 3:29 PM   Clinical Narrative:    Pt stable for transition home today, note d/c order has been placed  TC made to daughter [name]- no further questions regarding HH/DME arrangements- per [name] Bed is scheduled to be delivered by [employer] today around 2pm.   [name] does confirm that pt will need EMS transport home- address confirmed in epic. [name] voiced that she has some appointments this afternoon and request that pt transport be for around 5pm or after. CM to coordinate with bedside RN when pt ready for transport and will update [name] when transport has been called and give ETA.  Meds to be filled by [facility] pharmacy prior to discharge.   HHPT/OT has been set up w/ [facility]- liaison notified for start of care.   Outpt PC referral made to [facility]- they will follow up post discharge.   Paperwork for PTAR placed on chart- await for bedside RN to let CM know when pt ready to call for transport.    Final next level of care: Home w Home Health Services Barriers to Discharge: Barriers Resolved   Patient Goals and CMS Choice Patient states their goals for this hospitalization and ongoing recovery are:: return home w/ daughter CMS Medicare.gov Compare Post Acute Care list provided to:: Patient Represenative (must comment) Choice offered to / list presented to : Adult Children      Discharge Placement             Hpme w/ HH          Discharge Plan and Services Additional resources added to the After Visit Summary for     Discharge Planning Services: CM Consult Post Acute Care Choice: Durable Medical Equipment, Home Health          DME Arranged:  Hospital bed DME Agency: [facility] Date DME Agency Contacted: 06/20/23 Time DME Agency Contacted: [phone] Representative spoke with at DME Agency: Ian Malkin HH Arranged: PT, OT HH Agency: [facility] Date [facility] Agency Contacted: 06/30/23 Time HH Agency Contacted: [phone] Representative spoke with at [facility] Agency: Kandee Keen  Social Drivers of Health (SDOH) Interventions SDOH Screenings   Food Insecurity: Patient Unable To Answer (06/14/2023)  Housing: Patient Unable To Answer (06/14/2023)  Transportation Needs: Patient Unable To Answer (06/14/2023)  Recent Concern: Transportation Needs - Unmet Transportation Needs (04/25/2023)   Received from [facility]  Utilities: Patient Unable To Answer (06/14/2023)  Financial Resource Strain: Low Risk  (04/25/2023)   Received from [facility]  Physical Activity: Unknown (04/25/2023)   Received from [facility]  Social Connections: Patient Unable To Answer (06/14/2023)  Stress: No Stress Concern Present (04/25/2023)   Received from [facility]  Tobacco Use: Medium Risk (06/14/2023)     Readmission Risk Interventions    07/03/2023    3:26 PM  Readmission Risk Prevention Plan  Transportation Screening Complete  PCP or Specialist Appt within 5-7 Days Complete  Home Care Screening Complete  Medication Review (RN CM) Complete

## 2023-07-03 NOTE — TOC Benefit Eligibility Note (Signed)
 Pharmacy Patient Advocate Encounter  Insurance verification completed.    The patient is insured through [employer]. Patient has Medicare and is not eligible for a copay card, but may be able to apply for patient assistance or Medicare RX Payment Plan (Patient Must reach out to their plan, if eligible for payment plan), if available.    Ran test claim for Lacosamide 50mg  and the current 30 day co-pay is $6.04.   This test claim was processed through [facility]- copay amounts may vary at other pharmacies due to pharmacy/plan contracts, or as the patient moves through the different stages of their insurance plan.

## 2023-07-03 NOTE — Progress Notes (Signed)
 Dr. Antionette Char called and stated, "It is O.K. for patient to be D.C. Home tonight. [name] RN aware and will called EMS for Transport to home.

## 2023-07-03 NOTE — Discharge Summary (Signed)
 Physician Discharge Summary  Olivia Ross ZOX:[id number] DOB: 1946/03/01  PCP: Stevphen Rochester, MD  Admitted from: Home Discharged to: Home  Admit date: 06/14/2023 Discharge date: 07/03/2023  Recommendations for Outpatient Follow-up:    Follow-up Information     [facility] Follow up.   Why: (Adapt)- hospital bed arranged- they will contact you to schedule delivery Contact information: [address] [facility] [place] [address] [phone]         [facility] Follow up.   Why: referral made for outpt Palliative follow up- they will contact you post discharge Contact information: [address] [facility] [place] [address] [phone]        [facility], [facility] Follow up.   Specialty: Home Health Services Why: HHHPT/OT arranged- they will contact you to schedule Contact information: [address] [place] [address] [phone]         Stevphen Rochester, MD. Schedule an appointment as soon as possible for a visit.   Specialty: Family Medicine Why: Per daughter's report, PCP office does home visits and this can be arranged for patient to be seen in 1 week from hospital discharge with repeat labs (CBC, CMP, magnesium).  Also MD can consider outpatient Neurology follow-up if deemed necessary-transporting patient to outpatient office is a barrier. Contact information: [address] [place] [address] [phone]                  Home Health: Home Health Orders (From admission, onward)     Start     Ordered   06/20/23 1141  Home Health  At discharge       Question Answer Comment  To provide the following care/treatments PT   To provide the following care/treatments OT      06/20/23 1140             Equipment/Devices:     Durable Medical Equipment  (From admission, onward)           Start     Ordered   06/20/23 1258  For home use only DME Hospital bed  Once        Question Answer Comment  Length of Need 6 Months   Patient has (list medical condition): hx stroke   The above medical condition requires: Patient requires the ability to reposition frequently   Head must be elevated greater than: 30 degrees   Bed type Semi-electric   Support Surface: Gel Overlay      06/20/23 1257             Discharge Condition: Improved and stable but overall prognosis is poor.    Code Status: Full Code Diet recommendation:  Discharge Diet Orders (From admission, onward)     Start     Ordered   07/03/23 0000  Diet - low sodium heart healthy       Comments: DIET - DYS 1 Fluid consistency: Thin   Comments: AVOID ALL DAIRY PRODUCTS, ok for pt to have minced and moist foods Question:  Fluid consistency:  Answer:  Thin   07/03/23 1332             Discharge Diagnoses:  Principal Problem:   Seizure disorder, grand mal [facility])   Brief Hospital Course:   78 y.o. female with a history of vascular dementia, multiple strokes, hypertension, hyperlipidemia presented secondary to strokelike symptoms/code stroke with left gaze deviation aphasia and possible seizures. Neurology was consulted on admission and  started on Keppra. Patient required intubation and ICU admission with extubation same day. MRI brain negative for acute stroke. Hospitalization complicated by ongoing poor oral intake with ongoing goals of care discussions.  Patient was on Depakote but due to hyperammonemia, Neurology discontinued Depakote and initiated Vimpat on 4/1.  Sedation secondary to hyperammonemia/metabolic encephalopathy has improved.     Assessment & Plan:   Seizure Neurology consulted.  LTM EEG was negative for ongoing seizures and MRI was negative for acute processes to explain seizures.  Patient was initially managed on Keppra but was transitioned to valproic acid secondary to concern of lethargy.  - due to hyperammonemia/106 and related sedation again, Neurology discontinued  Depakote and initiated Vimpat on 4/1.   -Continue seizure precautions -As per my evaluation, compared to yesterday, patient is much more alert but not oriented.  As per discussion with RN, since yesterday patient continues to intermittently yell out, may say a word here and there, tolerating between 25-50% of meals at least. -May consider outpatient neurology follow-up but per discussion with daughter, her PCPs office does home visits and going to the Neurologist office may be a barrier unless her neurologist comes home.  Defer this to PCP. -Last dose of Depakote was on 4/1, then discontinued, initiated on lactulose, ammonia level dropped from 106-57 on 4/3 and now that the Depakote has been discontinued, expect this to normalize in a day or so.  Continue brief 3-day course of lactulose in the interim. -I also discussed with neurohospitalist on-call on day of discharge to recommended continuing current dose of Vimpat at discharge.   Pancreatic mass CT imaging with mass measuring 2.5 cm in the right upper quadrant of the pancreas with follow-up MRI of the abdomen is significant for 2.2 x 2.1 cm lesion of the pancreatic head with raise concern for neoplasm.  Findings discussed with by prior [facility] DM with family with recommendations and decision to not pursue ongoing workup or to pursue management secondary to patient's underlying comorbidities.  Palliative care consulted for ongoing goals of care.   Poor oral intake Unclear if this is from acute illness versus related to progressive worsening in setting of multiple chronic illnesses and underlying dementia.  Family is concerned that medication effect could be worsening mental status and there may be some truth to this.  Hyperammonemia related metabolic encephalopathy.  Medications adjusted to minimize effect on mental status.  Palliative care medicine also consulted for help with goals of care discussions.  At this time, focus is for quality of life, however  ongoing goals of care discussions are still being pursued. As per discussion with patient's RN on day of discharge, patient consuming between 25-50% of her meals.  She has not had a hypoglycemic episode since 3/30.   Left arm swelling Likely related to malfunctioning IV. Improved with removal of IV. No upper extremity DVT noted.   Acute respiratory failure with hypoxia Patient required intubation on admission but was quickly extubated same day.  Currently on room air.  Respiratory failure resolved.   Hypoglycemia Likely related to poor oral intake in addition to underlying poor nutrition with notable cachectic body habitus with associated underweight BMI.  C-peptide elevated with concern hypoglycemia could be insulin mediated. Patient managed with D50 in addition to D5 IV fluids. Incongruence between CBGs from left and right hand, possibly related to significant left upper extremity edema. -Continue octreotide-prescription was sent to [facility] pharmacy by prior [facility] MD. -No hypoglycemic episodes on phlebotomy blood draws, since 06/29/2023.  Discussed with daughter and discontinued blood draws for this.   Ileus Noted on x-ray. No nausea/vomiting. Abdomen is non-distended and non-tender today. Patient having bowel movements.   Hypokalemia Resolved with repletion   Hypomagnesemia Magnesium 1.6 on day of discharge, IV replacement prior to DC.   Vascular dementia Noted.  Complicating current prognosis.   Anemia of chronic disease Hemoglobin stable in the 7.5-8 g range.   Thrombocytopenia Unclear etiology.  Possibly related to acute illness. Resolved.   Oropharyngeal dysphagia Patient evaluated by speech therapy. -Speech therapy recommendations (3/21): Diet recommendations: Dysphagia 1 (puree);Thin liquid Liquids provided via: Cup;Straw Medication Administration: Crushed with puree Supervision: Full supervision/cueing for compensatory strategies Compensations: Slow rate;Small  sips/bites Postural Changes and/or Swallow Maneuvers: Seated upright 90 degrees   Elevated AST/ALT Mild.  MRI of the abdomen without evidence of hepatic injury/disease.  No obvious biliary obstruction.  Normal bilirubin.  AST and ALT have normalized.   Severe malnutrition -Dietitian recommendations (3/24): Continue Ensure Enlive po BID, each supplement provides 350 kcal and 20 grams of protein. Encourage caloric beverages such as juice.  Order [employer] with dinner.  MVI with minerals and folic acid.    Goals of care Palliative care consulted for assistance in goals of care discussions.  Patient is currently full code.  Prior [facility] MD had long discussion with daughter via telephone.  Per daughter, focus is comfort and quality of life.  She wants to focus on patient eating better to allow for better control of her blood sugar.  She believes that the antiepileptic drug treatment is contributing to patient's lethargy and decreased ability to take p.o.  When discussing the pancreatic mass with concern for cancer, daughter states that she agrees that workup and management would likely contribute negatively towards quality of life.  They also discussed CODE STATUS in addition to how much to do and how much not to do from his [place] as relates to her medical care, for which patient's daughter stated that she still needed time to think about this.   Body mass index is 17.77 kg/m.   Nutritional Status Nutrition Problem: Severe Malnutrition Etiology: social / environmental circumstances Signs/Symptoms: severe fat depletion, severe muscle depletion Interventions: Ensure Enlive (each supplement provides 350kcal and 20 grams of protein), MVI      Consultants:   Neurology Palliative care medicine   Procedures:       Discharge Instructions  Discharge Instructions     Ambulatory referral to Neurology   Complete by: As directed    An appointment is requested in approximately: 4 weeks - 8 weeks    Call MD for:   Complete by: As directed    Recurrent strokelike symptoms or seizure-like activity.   Call MD for:  difficulty breathing, headache or visual disturbances   Complete by: As directed    Call MD for:  extreme fatigue   Complete by: As directed    Call MD for:  persistant dizziness or light-headedness   Complete by: As directed    Call MD for:  persistant nausea and vomiting   Complete by: As directed    Call MD for:  severe uncontrolled pain   Complete by: As directed    Call MD for:  temperature >100.4   Complete by: As directed    Diet - low sodium heart healthy   Complete by: As directed    DIET - DYS 1 Fluid consistency: Thin   Comments: AVOID ALL DAIRY PRODUCTS, ok for pt to have minced  and moist foods Question:  Fluid consistency:  Answer:  Thin   Driving Restrictions   Complete by: As directed    Per [facility] statutes, patients with seizures are not allowed to drive until they have been seizure-free for six months.  Use caution when using heavy equipment or power tools. Avoid working on ladders or at heights. Take showers instead of baths. Ensure the water temperature is not too high on the home water heater. Do not go swimming alone. Do not lock yourself in a room alone (i.e. bathroom). When caring for infants or small children, sit down when holding, feeding, or changing them to minimize risk of injury to the child in the event you have a seizure. To reduce risk of seizures, maintain good sleep hygiene avoid alcohol and illicit drug use, take all anti-seizure medications as prescribed.   If patient has another seizure, call 911 and bring them back to the ED if: A.  The seizure lasts longer than 5 minutes.      B.  The patient doesn't wake shortly after the seizure or has new problems such as difficulty seeing, speaking or moving following the seizure C.  The patient was injured during the seizure D.  The patient has a temperature over 102 F (39C) E.   The patient vomited during the seizure and now is having trouble breathing   Increase activity slowly   Complete by: As directed    No wound care   Complete by: As directed         Medication List     STOP taking these medications    levETIRAcetam 250 MG tablet Commonly known as: Keppra   magnesium oxide 400 (240 Mg) MG tablet Commonly known as: MAG-OX       TAKE these medications    acetaminophen 500 MG tablet Commonly known as: TYLENOL Take 500 mg by mouth every 6 (six) hours as needed for moderate pain.   amLODipine 5 MG tablet Commonly known as: NORVASC Take 1 tablet (5 mg total) by mouth daily.   atorvastatin 80 MG tablet Commonly known as: LIPITOR Take 1 tablet (80 mg total) by mouth daily.   carboxymethylcellulose 0.5 % Soln Commonly known as: REFRESH PLUS Place 1 drop into both eyes 3 (three) times daily as needed (dry eyes).   cyanocobalamin 100 MCG tablet Take 1 tablet (100 mcg total) by mouth daily.   dicyclomine 10 MG capsule Commonly known as: BENTYL Take 10 mg by mouth daily as needed for spasms.   ezetimibe 10 MG tablet Commonly known as: ZETIA Take 1 tablet (10 mg total) by mouth daily.   folic acid 1 MG tablet Commonly known as: FOLVITE Take 1 tablet (1 mg total) by mouth daily. Start taking on: July 04, 2023   lacosamide 10 MG/ML oral solution Commonly known as: VIMPAT Take 5 mLs (50 mg total) by mouth 2 (two) times daily.   lactulose 10 GM/15ML solution Commonly known as: CHRONULAC Take 15 mLs (10 g total) by mouth daily for 3 days. Start taking on: July 04, 2023   letrozole 2.5 MG tablet Commonly known as: FEMARA Take 2.5 mg by mouth daily.   loperamide 2 MG tablet Commonly known as: IMODIUM A-D Take 2 mg by mouth 4 (four) times daily as needed for diarrhea or loose stools.   multivitamin with minerals Tabs tablet Take 1 tablet by mouth daily. Centrum   Octreotide Acetate 100 MCG/ML Sosy Inject 1 mL (100 mcg total) into  the skin every 8 (eight) hours.   polyethylene glycol powder 17 GM/SCOOP powder Commonly known as: GLYCOLAX/MIRALAX Take 1 capful (17 g) with water by mouth daily as needed for moderate constipation.   senna-docusate 8.6-50 MG tablet Commonly known as: Senokot-S Take 2 tablets by mouth at bedtime as needed for mild constipation.   valsartan 160 MG tablet Commonly known as: DIOVAN Take 160 mg by mouth daily.       Allergies  Allergen Reactions   Diphth-Acell Pertussis-Tetanus     Other reaction(s): Other (See Comments)   Dtap-Hepatitis B Recomb-Ipv Other (See Comments)    unknown   Lactose Intolerance (Gi) Nausea And Vomiting   Norco [Hydrocodone-Acetaminophen] Nausea And Vomiting   Percocet [Oxycodone-Acetaminophen] Nausea And Vomiting      Procedures/Studies: VAS [place] UPPER EXTREMITY VENOUS DUPLEX Result Date: 06/27/2023 UPPER VENOUS STUDY  Patient Name:  Olivia Ross  Date of Exam:   06/27/2023 Medical Rec #: [id number]       Accession #:    [id number] Date of Birth: [birth year]       Patient Gender: F Patient Age:   5 years Exam Location:  [facility] Procedure:      VAS [place] UPPER EXTREMITY VENOUS DUPLEX Referring Phys: RALPH NETTEY --------------------------------------------------------------------------------  Indications: Edema, and Swelling Limitations: Position of patient in bed. Technically difficult to fully evaluate left upper extremity and right subc vein. Comparison Study: No priors. Performing Technologist: [place] S[place] Sturdivant-Jones RDMS, RVT  Examination Guidelines: A complete evaluation includes B-mode imaging, spectral Doppler, color Doppler, and power Doppler as needed of all accessible portions of each vessel. Bilateral testing is considered an integral part of a complete examination. Limited examinations for reoccurring indications may be performed as noted.  Left Findings: +----------+------------+---------+-----------+----------+-------+ LEFT       CompressiblePhasicitySpontaneousPropertiesSummary +----------+------------+---------+-----------+----------+-------+ IJV           Full       Yes       Yes                      +----------+------------+---------+-----------+----------+-------+ Subclavian    Full       Yes       Yes                      +----------+------------+---------+-----------+----------+-------+ Axillary      Full       Yes       Yes                      +----------+------------+---------+-----------+----------+-------+ Brachial      Full                                          +----------+------------+---------+-----------+----------+-------+ Radial        Full                                          +----------+------------+---------+-----------+----------+-------+ Ulnar         Full                                          +----------+------------+---------+-----------+----------+-------+ Cephalic      Full                                          +----------+------------+---------+-----------+----------+-------+  Basilic       Full                                          +----------+------------+---------+-----------+----------+-------+  Summary:  Left: No evidence of deep vein thrombosis in the upper extremity. No evidence of superficial vein thrombosis in the upper extremity.  *See table(s) above for measurements and observations.  Diagnosing physician: Carolynn Sayers Electronically signed by Carolynn Sayers on 06/27/2023 at 1:41:46 PM.    Final    [place] EKG SITE RITE Result Date: 06/27/2023 If Site Rite image not attached, placement could not be confirmed due to current cardiac rhythm.  DG Abd Portable 1V Result Date: 06/26/2023 CLINICAL DATA:  Abdominal pain EXAM: PORTABLE ABDOMEN - 1 VIEW COMPARISON:  CT abdomen and pelvis 06/15/2023 FINDINGS: Gas-filled nondistended large and small bowel. Appearances are likely to represent ileus. No radiopaque stones are demonstrated.  Surgical clips in the right upper quadrant. Vascular calcification in the aorta. Lung bases are clear. Degenerative changes in the spine and hips. Patient rotation limits examination. IMPRESSION: Gas-filled nondistended small and large bowel likely representing ileus. Electronically Signed   By: Burman Nieves M.D.   On: 06/26/2023 19:47   MR ABDOMEN W WO CONTRAST Result Date: 06/17/2023 CLINICAL DATA:  Abdominal mass identified by prior CT EXAM: MRI ABDOMEN WITHOUT AND WITH CONTRAST TECHNIQUE: Multiplanar multisequence MR imaging of the abdomen was performed both before and after the administration of intravenous contrast. CONTRAST:  4mL GADAVIST GADOBUTROL 1 MMOL/ML IV SOLN COMPARISON:  CT abdomen pelvis, 06/15/2023 FINDINGS: Examination is significantly limited by breath motion artifact throughout, particularly limiting multiphasic contrast enhanced sequences. Lower chest: No acute abnormality. Hepatobiliary: No focal liver abnormality is seen. Status post cholecystectomy. No biliary dilatation. Pancreas: Rounded, circumscribed appearing lesion in the central pancreatic head measuring 2.2 x 2.1 cm (series 10, image 22). This appears to demonstrate some degree of contrast enhancement however evaluation is substantially limited by pervasive breath motion artifact degrading multiphasic enhanced sequences. Incidental note of pancreas divisum. No pancreatic ductal dilatation or surrounding inflammatory changes. Spleen: Normal in size without significant abnormality. Adrenals/Urinary Tract: Adrenal glands are unremarkable. Kidneys are normal, without renal calculi, solid lesion, or hydronephrosis. Stomach/Bowel: Stomach is within normal limits. No evidence of bowel wall thickening, distention, or inflammatory changes. Large burden of stool in the included colon. Vascular/Lymphatic: Severe aortic atherosclerosis. No enlarged abdominal lymph nodes. Other: No abdominal wall hernia or abnormality. No ascites.  Musculoskeletal: Diffusely heterogeneous, abnormal appearance of the vertebral and pelvic bone marrow (series 4, image 16). IMPRESSION: 1. Examination is significantly limited by breath motion artifact throughout, particularly limiting multiphasic contrast enhanced sequences. 2. Rounded, circumscribed appearing lesion in the central pancreatic head measuring 2.2 x 2.1 cm. This appears to demonstrate some degree of contrast enhancement however evaluation is substantially limited by pervasive breath motion artifact degrading multiphasic enhanced sequences. This is concerning for pancreatic neoplasm, although not typical in appearance for adenocarcinoma, possibly a neuroendocrine tumor. Consider EUS/FNA for further assessment. 3. Diffusely heterogeneous, abnormal appearance of the bone marrow, of uncertain significance although worrisome for diffuse osseous metastatic disease. 4. Pancreas divisum. 5. Status post cholecystectomy. 6. Large burden of stool. Aortic Atherosclerosis (ICD10-I70.0). Electronically Signed   By: Jearld Lesch M.D.   On: 06/17/2023 08:26   MR BRAIN W WO CONTRAST Result Date: 06/15/2023 CLINICAL DATA:  New onset seizure EXAM: MRI HEAD WITHOUT AND  WITH CONTRAST TECHNIQUE: Multiplanar, multiecho pulse sequences of the brain and surrounding structures were obtained without and with intravenous contrast. CONTRAST:  4mL GADAVIST GADOBUTROL 1 MMOL/ML IV SOLN COMPARISON:  None Available. FINDINGS: Brain: No acute infarct, mass effect or extra-axial collection. Small amount of chronic blood products over both hemispheres. Old left frontal, left parietal and right parietotemporal infarcts. 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 Chronic ischemic white matter changes. The midline structures are normal. Vascular: Normal flow voids. Skull and upper cervical spine: Normal calvarium and skull base. Visualized upper cervical spine and soft tissues are normal. Sinuses/Orbits:No paranasal sinus fluid  levels or advanced mucosal thickening. No mastoid or middle ear effusion. Normal orbits. IMPRESSION: 1. No acute intracranial abnormality. 2. Old left frontal, left parietal and right parietotemporal infarcts. 3. 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 Electronically Signed   By: Deatra Robinson M.D.   On: 06/15/2023 19:33   CT ABDOMEN PELVIS WO CONTRAST Result Date: 06/15/2023 CLINICAL DATA:  Postop abdominal pain, rule out cancer. History breast cancer. EXAM: CT ABDOMEN AND PELVIS WITHOUT CONTRAST TECHNIQUE: Multidetector CT imaging of the abdomen and pelvis was performed following the standard protocol without IV contrast. RADIATION DOSE REDUCTION: This exam was performed according to the departmental dose-optimization program which includes automated exposure control, adjustment of the mA and/or kV according to patient size and/or use of iterative reconstruction technique. COMPARISON:  None are available FINDINGS: Exam is severely compromised by paucity of intra-abdominal fat and lack of oral or IV contrast. Lower chest: Small right pleural effusion. Hepatobiliary: Cholecystectomy. Unremarkable noncontrast appearance of the liver. Pancreas: Grossly unremarkable. Spleen: 1.3 cm hypoattenuating lesion in the spleen (series 3/image 16) is indeterminate but statistically likely to represent a benign cyst or hemangioma. Adrenals/Urinary Tract: Adrenal glands are grossly unremarkable. Contrast from yesterday's CT head and neck within the renal collecting systems and bladder. Foley catheter in the bladder. No hydronephrosis. Stomach/Bowel: Large colonic stool burden. Vascular/Lymphatic: Advanced aortic atherosclerotic calcification. Round mildly hyperdense mass in the right upper quadrant measuring 2.5 cm (series 3/image 21 and series 7/image 61). This is not well evaluated without IV contrast. Reproductive: Not well visualized. Other: No free intraperitoneal air. Musculoskeletal: No acute fracture or  destructive osseous lesion. Demineralization. IMPRESSION: 1. Exam is severely compromised by paucity of intra-abdominal fat and lack of oral or IV contrast. 2. Indeterminate 2.5 cm round mildly hyperdense mass in the right upper quadrant measuring 2.5 cm. This is not well evaluated without IV contrast. Recommend further evaluation with contrast-enhanced CT or MRI. 3. Large colonic stool burden. 4. Small right pleural effusion. 5. Aortic Atherosclerosis (ICD10-I70.0). Electronically Signed   By: Minerva Fester M.D.   On: 06/15/2023 19:28   Overnight EEG with video Result Date: 06/15/2023 Charlsie Quest, MD     06/15/2023  1:31 PM Patient Name: Olivia Ross MRN: [id number] Epilepsy Attending: Charlsie Quest Referring Physician/Provider: Erick Blinks, MD Duration: 06/14/2023 4098 to 06/15/2023 0944 Patient history: 78 y.o. 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 She had a GTC seizure that was witnessed by EMS enroute with forced L gaze deviation, outstertched arms and given versed. She was obtunded and being bagged on arrival with no clinical seizure activity noted. EEG to evaluate for seizure Level of alertness:  lethargic AEDs during EEG study: LEV, propofol Technical aspects: This EEG study was done with scalp electrodes positioned according to  the 10-20 International system of electrode placement. Electrical activity was reviewed with band pass filter of 1-70Hz , sensitivity of 7 uV/mm, display speed of 31mm/sec with a 60Hz  notched filter applied as appropriate. EEG data were recorded continuously and digitally stored.  Video monitoring was available and reviewed as appropriate. Description: EEG showed continuous generalized polymorphic 3 to 6 Hz theta-delta slowing, at times with triphasic morphology. Hyperventilation and photic stimulation were not performed.   ABNORMALITY - Continuous slow,  generalized IMPRESSION: This study is suggestive of moderate diffuse encephalopathy. No seizures or definite epileptiform discharges were seen throughout the recording. Priyanka [name]   CT ANGIO HEAD NECK W WO CM (CODE STROKE) Result Date: 06/14/2023 CLINICAL DATA:  Stroke workup EXAM: CT ANGIOGRAPHY HEAD AND NECK WITH AND WITHOUT CONTRAST TECHNIQUE: Multidetector CT imaging of the head and neck was performed using the standard protocol during bolus administration of intravenous contrast. Multiplanar CT image reconstructions and MIPs were obtained to evaluate the vascular anatomy. Carotid stenosis measurements (when applicable) are obtained utilizing NASCET criteria, using the distal internal carotid diameter as the denominator. RADIATION DOSE REDUCTION: This exam was performed according to the departmental dose-optimization program which includes automated exposure control, adjustment of the mA and/or kV according to patient size and/or use of iterative reconstruction technique. CONTRAST:  75mL OMNIPAQUE IOHEXOL 350 MG/ML SOLN COMPARISON:  MRA 02/03/2022 FINDINGS: CTA NECK FINDINGS Aortic arch: Atheromatous plaque without acute finding. 3 vessel branching Right carotid system: Diffuse atheromatous wall thickening with fairly bulky mainly calcified plaque at the bifurcation not causing flow reducing stenosis or ulceration. Left carotid system: Diffuse atheromatous wall thickening of the common carotid with accentuated mainly calcified plaque at the bifurcation not causing flow reducing stenosis. No ulceration or beading. Vertebral arteries: Proximal subclavian atherosclerosis without significant stenosis. The vertebral arteries are smoothly contoured and diffusely patent to the dura. Atheromatous calcification at the bilateral vertebral origin. Skeleton: Paucity of fat with hazy intermuscular fat usually seen and cachexia. No acute osseous finding. Other neck: Endotracheal and enteric tubes are in unremarkable  position in the neck. Upper chest: No acute finding Review of the MIP images confirms the above findings CTA HEAD FINDINGS Anterior circulation: Heavily calcified carotid siphons which are diffusely attenuated. No progression from prior MRA suspected; no focal advanced stenosis. Generalized atheromatous irregularity of intracranial branches. The right MCA is chronically occluded with anterior temporal branch or anterior division branch showing faint enhancement. Left M2 moderate stenosis based on coronal thick MIPS. No evidence of aneurysm or emergent large vessel occlusion. Posterior circulation: The vertebral and basilar arteries show atheromatous undulation but no flow reducing stenosis. No major branch occlusion, beading, or aneurysm. Venous sinuses: Unremarkable for arterial scan. Anatomic variants: None significant Review of the MIP images confirms the above findings IMPRESSION: 1. No emergent finding. 2. Atherosclerosis which severely affects intracranial circulation. 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 Electronically Signed   By: Tiburcio Pea M.D.   On: 06/14/2023 04:31   CT HEAD CODE STROKE WO CONTRAST Result Date: 06/14/2023 CLINICAL DATA:  Code stroke.  Aphasia and left-sided gaze. EXAM: CT HEAD WITHOUT CONTRAST TECHNIQUE: Contiguous axial images were obtained from the base of the skull through the vertex without intravenous contrast. RADIATION DOSE REDUCTION: This exam was performed according to the departmental dose-optimization program which includes automated exposure control, adjustment of the mA and/or kV according to patient size and/or use  of iterative reconstruction technique. COMPARISON:  01/31/2022 FINDINGS: Brain: No evidence of acute infarction, hemorrhage, hydrocephalus, extra-axial collection or mass lesion/mass effect. 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 Perforator infarcts seen in the bilateral basal ganglia and deep white matter tracks. Vascular: No hyperdense vessel or unexpected calcification. Skull: Normal. Negative for fracture or focal lesion. Sinuses/Orbits: No acute finding. Other: Cachectic appearance of soft tissue planes in the upper neck. Prelim sent in epic chat. IMPRESSION: 1. No acute or interval finding. 2. 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 Electronically Signed   By: Tiburcio Pea M.D.   On: 06/14/2023 04:22   DG Chest Portable 1 View Result Date: 06/14/2023 CLINICAL DATA:  Intubation EXAM: PORTABLE CHEST 1 VIEW COMPARISON:  02/03/2022 FINDINGS: Endotracheal tube with tip between the clavicular heads and carina. An enteric tube at least reaches the distal stomach. Lucency over the upper abdomen likely related to stomach, there is distortion from overlapping skin folds. There is no edema, consolidation, effusion, or pneumothorax. Clips overlap the bilateral chest. Normal heart size and mediastinal contours. IMPRESSION: Located endotracheal and orogastric tubes. Electronically Signed   By: Tiburcio Pea M.D.   On: 06/14/2023 04:15      Subjective: Patient is much more awake and alert this morning.  Intermittently yelling out.  Was chewing on her gown.  Rest of history as noted above as per discussion with patient's RN.  Discharge Exam:  Vitals:   07/02/23 2245 07/03/23 0249 07/03/23 0932 07/03/23 1215  BP: 135/82 (!) 143/94 (!) 140/75 (!) 150/69  Pulse: 73 69 74 74  Resp: 13 (!) 9 16 14   Temp: 99.1 F (37.3 C)  97.8 F (36.6 C)   TempSrc: Oral  Oral   SpO2: 100% 100% 97% 100%  Weight:      Height:        General exam: Elderly female, small built, frail, chronically ill looking and cachectic, lying crouched in bed, possibly has upper extremity and even lower  extremity contractures.  Looks better than she did yesterday. Respiratory system: Clear to auscultation. Respiratory effort normal. Cardiovascular system: S1 & S2 heard, RRR. No JVD, murmurs, rubs, gallops or clicks. No pedal edema.  Telemetry personally reviewed: Sinus rhythm. Gastrointestinal system: Abdomen is nondistended, soft and nontender. No organomegaly or masses felt. Normal bowel sounds heard. Central nervous system: Mental status as noted above.  Does not follow any instructions.  No focal neurological deficits. Extremities: Unable to assess power.  May have contractures of all extremities. Skin: No rashes, lesions or ulcers Psychiatry: Judgement and insight cannot be assessed. Mood & affect cannot be assessed.     The results of significant diagnostics from this hospitalization (including imaging, microbiology, ancillary and laboratory) are listed below for reference.     Microbiology: No results found for this or any previous visit (from the past 240 hours).   Labs: CBC: Recent Labs  Lab 06/27/23 0330 07/03/23 1201  WBC 5.7 7.7  HGB 8.0* 8.5*  HCT 24.9* 26.2*  MCV 102.5* 104.4*  PLT 201 283    Basic Metabolic Panel: Recent Labs  Lab 06/27/23 0330 06/28/23 0420 06/29/23 1813 06/30/23 0848 06/30/23 1203 07/01/23 0514 07/01/23 0802 07/02/23 0425 07/02/23 0846 07/02/23 1218 07/02/23 1847 07/02/23 2049 07/03/23 0010 07/03/23 1201  NA 138  --   --   --   --  140  --   --   --   --   --   --   --  142  K 4.5  --   --   --   --  4.0  --   --   --   --   --   --   --  3.8  CL 106  --   --   --   --  104  --   --   --   --   --   --   --  106  CO2 26  --   --   --   --  25  --   --   --   --   --   --   --  26  GLUCOSE 103*  --    < > 83   < > 109*   < > 90   < > 132* 97 119* 110* 144*  BUN 17  --   --   --   --  23  --   --   --   --   --   --   --  29*  CREATININE 0.51  --   --   --   --  0.65  --   --   --   --   --   --   --  0.72  CALCIUM 8.7*  --   --    --   --  9.1  --   --   --   --   --   --   --  9.0  MG 1.9 1.7  --  1.5*  --  2.1  --  1.9  --   --   --   --   --  1.6*   < > = values in this interval not displayed.    Liver Function Tests: Recent Labs  Lab 06/27/23 0330 07/03/23 1201  AST 16 22  ALT 23 21  ALKPHOS 86 73  BILITOT 0.6 0.4  PROT 4.6* 4.6*  ALBUMIN 2.2* 2.0*    CBG: Recent Labs  Lab 06/30/23 0025 06/30/23 0503 06/30/23 0522 06/30/23 0524 06/30/23 0752  GLUCAP 123* 18* 17* 190* 61*     Urinalysis    Component Value Date/Time   COLORURINE COLORLESS (A) 06/14/2023 0516   APPEARANCEUR CLEAR 06/14/2023 0516   LABSPEC 1.009 06/14/2023 0516   PHURINE 8.0 06/14/2023 0516   GLUCOSEU NEGATIVE 06/14/2023 0516   HGBUR NEGATIVE 06/14/2023 0516   BILIRUBINUR NEGATIVE 06/14/2023 0516   KETONESUR NEGATIVE 06/14/2023 0516   PROTEINUR NEGATIVE 06/14/2023 0516   NITRITE NEGATIVE 06/14/2023 0516   LEUKOCYTESUR NEGATIVE 06/14/2023 0516    Discussed in detail with patient's daughter via phone, updated care and answered all questions.  She was agreeable to patient discharging home today and was awaiting hospital bed to be delivered at around 2 PM.  Time coordinating discharge: 45 minutes  SIGNED:  Marcellus Scott, MD,  FACP, [facility], [facility], [facility]   [facility] [place]     To contact the attending provider between 7A-7P or the covering provider during after hours 7P-7A, please log into the web site [email or web address] and access using universal [place] password for that web site. If you do not have the password, please call the hospital operator.

## 2023-07-03 NOTE — Plan of Care (Signed)
  Problem: Education: Goal: Ability to describe self-care measures that may prevent or decrease complications (Diabetes Survival Skills Education) will improve Outcome: Adequate for Discharge   Problem: Coping: Goal: Ability to adjust to condition or change in health will improve Outcome: Adequate for Discharge   Problem: Fluid Volume: Goal: Ability to maintain a balanced intake and output will improve Outcome: Adequate for Discharge   Problem: Health Behavior/Discharge Planning: Goal: Ability to identify and utilize available resources and services will improve Outcome: Adequate for Discharge Goal: Ability to manage health-related needs will improve Outcome: Adequate for Discharge   Problem: Metabolic: Goal: Ability to maintain appropriate glucose levels will improve Outcome: Adequate for Discharge   Problem: Nutritional: Goal: Maintenance of adequate nutrition will improve Outcome: Adequate for Discharge Goal: Progress toward achieving an optimal weight will improve Outcome: Adequate for Discharge   Problem: Skin Integrity: Goal: Risk for impaired skin integrity will decrease Outcome: Adequate for Discharge   Problem: Tissue Perfusion: Goal: Adequacy of tissue perfusion will improve Outcome: Adequate for Discharge   Problem: Education: Goal: Knowledge of General Education information will improve Description: Including pain rating scale, medication(s)/side effects and non-pharmacologic comfort measures Outcome: Adequate for Discharge   Problem: Health Behavior/Discharge Planning: Goal: Ability to manage health-related needs will improve Outcome: Adequate for Discharge   Problem: Clinical Measurements: Goal: Ability to maintain clinical measurements within normal limits will improve Outcome: Adequate for Discharge Goal: Will remain free from infection Outcome: Adequate for Discharge Goal: Diagnostic test results will improve Outcome: Adequate for Discharge Goal:  Respiratory complications will improve Outcome: Adequate for Discharge Goal: Cardiovascular complication will be avoided Outcome: Adequate for Discharge   Problem: Activity: Goal: Risk for activity intolerance will decrease Outcome: Adequate for Discharge   Problem: Nutrition: Goal: Adequate nutrition will be maintained Outcome: Adequate for Discharge   Problem: Coping: Goal: Level of anxiety will decrease Outcome: Adequate for Discharge   Problem: Elimination: Goal: Will not experience complications related to bowel motility Outcome: Adequate for Discharge Goal: Will not experience complications related to urinary retention Outcome: Adequate for Discharge   Problem: Pain Managment: Goal: General experience of comfort will improve and/or be controlled Outcome: Adequate for Discharge   Problem: Safety: Goal: Ability to remain free from injury will improve Outcome: Adequate for Discharge   Problem: Skin Integrity: Goal: Risk for impaired skin integrity will decrease Outcome: Adequate for Discharge   Problem: SLP Dysphagia Goals Goal: Patient will utilize recommended strategies Description: Patient will utilize recommended strategies during swallow to increase swallowing safety with Outcome: Adequate for Discharge   Problem: Acute Rehab OT Goals (only OT should resolve) Goal: Pt. Will Perform Eating Outcome: Adequate for Discharge Goal: Pt/Caregiver Will Perform Home Exercise Program Outcome: Adequate for Discharge Goal: OT Additional ADL Goal #1 Outcome: Adequate for Discharge   Problem: Acute Rehab PT Goals(only PT should resolve) Goal: Pt Will Go Supine/Side To Sit Outcome: Adequate for Discharge Goal: Pt Will Go Sit To Supine/Side Outcome: Adequate for Discharge Goal: Pt Will Transfer Bed To Chair/Chair To Bed Outcome: Adequate for Discharge

## 2023-07-03 NOTE — Progress Notes (Signed)
 Patient voided very small amt. On towel. Bladder scan 68 ml. Stephanie aware. Sr. Opyd text paged and made aware. Waiting on ok to D.C. tonight

## 2023-07-03 NOTE — Progress Notes (Signed)
 Discharged instructions reviewed via phone with daughter, [name]. Per [name], all necessary equipment has been delivered to the house. Medications picked up from [facility] pharmacy and at bedside. Foley was removed around 3pm, currently waiting on patient to void before discharging home.

## 2023-07-03 NOTE — Discharge Instructions (Signed)

## 2023-07-04 LAB — GLUCOSE, RANDOM: Glucose, Bld: 121 mg/dL — ABNORMAL HIGH (ref 70–99)

## 2023-07-04 NOTE — Progress Notes (Signed)
 Attempted to call daughter Carver Fila  x2 to let her know Mother is on her way home, via pied. [facility] W/ her meds and belongings

## 2023-07-04 NOTE — Progress Notes (Signed)
 Called [facility] for transportation to home. At [phone]. Will have the information and will come get patient .

## 2023-07-04 NOTE — Progress Notes (Signed)
 Attempted to call daughter again. NO answer. Dr. Antionette Char aware and still wants her taken home. Phyllis Ginger house coverage aware.
